# Patient Record
Sex: Female | Born: 1955 | Race: White | Hispanic: No | Marital: Single | Smoking: Current every day smoker
Health system: Southern US, Community
[De-identification: ages and names within clinical notes are randomized; demographics above are authoritative.]

## PROBLEM LIST (undated history)

## (undated) DIAGNOSIS — F419 Anxiety disorder, unspecified: Secondary | ICD-10-CM

## (undated) DIAGNOSIS — B1920 Unspecified viral hepatitis C without hepatic coma: Secondary | ICD-10-CM

## (undated) DIAGNOSIS — C189 Malignant neoplasm of colon, unspecified: Secondary | ICD-10-CM

## (undated) DIAGNOSIS — B182 Chronic viral hepatitis C: Secondary | ICD-10-CM

## (undated) DIAGNOSIS — A6000 Herpesviral infection of urogenital system, unspecified: Secondary | ICD-10-CM

## (undated) DIAGNOSIS — T7840XA Allergy, unspecified, initial encounter: Secondary | ICD-10-CM

## (undated) DIAGNOSIS — I10 Essential (primary) hypertension: Secondary | ICD-10-CM

## (undated) DIAGNOSIS — F1020 Alcohol dependence, uncomplicated: Secondary | ICD-10-CM

## (undated) DIAGNOSIS — C20 Malignant neoplasm of rectum: Secondary | ICD-10-CM

## (undated) DIAGNOSIS — Z86718 Personal history of other venous thrombosis and embolism: Secondary | ICD-10-CM

## (undated) DIAGNOSIS — G43909 Migraine, unspecified, not intractable, without status migrainosus: Secondary | ICD-10-CM

## (undated) DIAGNOSIS — B009 Herpesviral infection, unspecified: Secondary | ICD-10-CM

## (undated) HISTORY — DX: Herpesviral infection, unspecified: B00.9

## (undated) HISTORY — DX: Allergy, unspecified, initial encounter: T78.40XA

## (undated) HISTORY — DX: Herpesviral infection of urogenital system, unspecified: A60.00

## (undated) HISTORY — PX: COLONOSCOPY W/ BIOPSIES AND POLYPECTOMY: SHX1376

## (undated) HISTORY — DX: Unspecified viral hepatitis C without hepatic coma: B19.20

## (undated) HISTORY — DX: Migraine, unspecified, not intractable, without status migrainosus: G43.909

## (undated) HISTORY — PX: COLON SURGERY: SHX602

## (undated) HISTORY — DX: Personal history of other venous thrombosis and embolism: Z86.718

## (undated) HISTORY — DX: Chronic viral hepatitis C: B18.2

## (undated) HISTORY — PX: TONSILLECTOMY AND ADENOIDECTOMY: SUR1326

## (undated) HISTORY — DX: Malignant neoplasm of rectum: C20

## (undated) HISTORY — DX: Anxiety disorder, unspecified: F41.9

## (undated) HISTORY — PX: TONSILLECTOMY: SUR1361

## (undated) HISTORY — DX: Malignant neoplasm of colon, unspecified: C18.9

## (undated) HISTORY — DX: Alcohol dependence, uncomplicated: F10.20

## (undated) HISTORY — DX: Essential (primary) hypertension: I10

## (undated) HISTORY — PX: BLADDER REPAIR: SHX76

---

## 1994-01-14 HISTORY — PX: APPENDECTOMY: SHX54

## 1994-01-14 HISTORY — PX: ABDOMINAL HYSTERECTOMY: SHX81

## 2001-05-11 ENCOUNTER — Encounter: Payer: Self-pay | Admitting: Internal Medicine

## 2001-05-11 ENCOUNTER — Encounter: Admission: RE | Admit: 2001-05-11 | Discharge: 2001-05-11 | Payer: Self-pay | Admitting: Internal Medicine

## 2007-07-15 DIAGNOSIS — C189 Malignant neoplasm of colon, unspecified: Secondary | ICD-10-CM

## 2007-07-15 HISTORY — DX: Malignant neoplasm of colon, unspecified: C18.9

## 2007-08-31 ENCOUNTER — Encounter: Admission: RE | Admit: 2007-08-31 | Discharge: 2007-08-31 | Payer: Self-pay | Admitting: Surgery

## 2007-09-03 ENCOUNTER — Ambulatory Visit: Payer: Self-pay | Admitting: Hematology and Oncology

## 2007-09-07 ENCOUNTER — Ambulatory Visit: Admission: RE | Admit: 2007-09-07 | Discharge: 2007-11-08 | Payer: Self-pay | Admitting: Radiation Oncology

## 2007-09-07 ENCOUNTER — Encounter: Payer: Self-pay | Admitting: Gastroenterology

## 2007-09-09 ENCOUNTER — Telehealth (INDEPENDENT_AMBULATORY_CARE_PROVIDER_SITE_OTHER): Payer: Self-pay | Admitting: *Deleted

## 2007-09-09 ENCOUNTER — Encounter: Payer: Self-pay | Admitting: Gastroenterology

## 2007-09-09 DIAGNOSIS — C7A026 Malignant carcinoid tumor of the rectum: Secondary | ICD-10-CM

## 2007-09-10 LAB — CBC WITH DIFFERENTIAL/PLATELET
BASO%: 0.6 % (ref 0.0–2.0)
Eosinophils Absolute: 0.2 10*3/uL (ref 0.0–0.5)
HCT: 42.4 % (ref 34.8–46.6)
LYMPH%: 18.8 % (ref 14.0–48.0)
MONO#: 0.5 10*3/uL (ref 0.1–0.9)
NEUT#: 5.3 10*3/uL (ref 1.5–6.5)
NEUT%: 71.8 % (ref 39.6–76.8)
Platelets: 294 10*3/uL (ref 145–400)
RBC: 4.13 10*6/uL (ref 3.70–5.32)
WBC: 7.4 10*3/uL (ref 3.9–10.0)
lymph#: 1.4 10*3/uL (ref 0.9–3.3)

## 2007-09-10 LAB — COMPREHENSIVE METABOLIC PANEL
ALT: 38 U/L — ABNORMAL HIGH (ref 0–35)
AST: 27 U/L (ref 0–37)
Albumin: 4.5 g/dL (ref 3.5–5.2)
CO2: 21 mEq/L (ref 19–32)
Calcium: 9.8 mg/dL (ref 8.4–10.5)
Chloride: 100 mEq/L (ref 96–112)
Creatinine, Ser: 0.66 mg/dL (ref 0.40–1.20)
Potassium: 3.8 mEq/L (ref 3.5–5.3)

## 2007-09-10 LAB — CEA: CEA: 4.5 ng/mL (ref 0.0–5.0)

## 2007-09-14 ENCOUNTER — Ambulatory Visit (HOSPITAL_COMMUNITY): Admission: RE | Admit: 2007-09-14 | Discharge: 2007-09-14 | Payer: Self-pay | Admitting: Gastroenterology

## 2007-09-14 ENCOUNTER — Encounter: Payer: Self-pay | Admitting: Gastroenterology

## 2007-09-15 ENCOUNTER — Ambulatory Visit (HOSPITAL_COMMUNITY): Admission: RE | Admit: 2007-09-15 | Discharge: 2007-09-15 | Payer: Self-pay | Admitting: Hematology and Oncology

## 2007-09-16 ENCOUNTER — Ambulatory Visit: Payer: Self-pay | Admitting: Gastroenterology

## 2007-09-24 ENCOUNTER — Ambulatory Visit (HOSPITAL_COMMUNITY): Admission: RE | Admit: 2007-09-24 | Discharge: 2007-09-24 | Payer: Self-pay | Admitting: Hematology and Oncology

## 2007-09-30 LAB — BASIC METABOLIC PANEL
CO2: 24 mEq/L (ref 19–32)
Chloride: 102 mEq/L (ref 96–112)
Creatinine, Ser: 0.67 mg/dL (ref 0.40–1.20)
Potassium: 3.7 mEq/L (ref 3.5–5.3)

## 2007-09-30 LAB — CBC WITH DIFFERENTIAL/PLATELET
BASO%: 1.5 % (ref 0.0–2.0)
Basophils Absolute: 0.1 10*3/uL (ref 0.0–0.1)
HCT: 38.6 % (ref 34.8–46.6)
HGB: 13.6 g/dL (ref 11.6–15.9)
MCHC: 35.2 g/dL (ref 32.0–36.0)
MONO#: 0.4 10*3/uL (ref 0.1–0.9)
NEUT%: 64.1 % (ref 39.6–76.8)
WBC: 4.3 10*3/uL (ref 3.9–10.0)
lymph#: 1 10*3/uL (ref 0.9–3.3)

## 2007-10-07 LAB — CBC WITH DIFFERENTIAL/PLATELET
Basophils Absolute: 0 10*3/uL (ref 0.0–0.1)
EOS%: 5 % (ref 0.0–7.0)
HGB: 12.4 g/dL (ref 11.6–15.9)
LYMPH%: 17.6 % (ref 14.0–48.0)
MCH: 36.4 pg — ABNORMAL HIGH (ref 26.0–34.0)
MCV: 103.2 fL — ABNORMAL HIGH (ref 81.0–101.0)
MONO%: 13.9 % — ABNORMAL HIGH (ref 0.0–13.0)
NEUT%: 63.1 % (ref 39.6–76.8)
Platelets: 196 10*3/uL (ref 145–400)
RDW: 13.8 % (ref 11.3–14.5)

## 2007-10-14 LAB — CBC WITH DIFFERENTIAL/PLATELET
EOS%: 8.2 % — ABNORMAL HIGH (ref 0.0–7.0)
LYMPH%: 13.7 % — ABNORMAL LOW (ref 14.0–48.0)
MCH: 35.5 pg — ABNORMAL HIGH (ref 26.0–34.0)
MCHC: 35.9 g/dL (ref 32.0–36.0)
MCV: 99.1 fL (ref 81.0–101.0)
MONO%: 10.2 % (ref 0.0–13.0)
NEUT#: 3.2 10*3/uL (ref 1.5–6.5)
RBC: 3.64 10*6/uL — ABNORMAL LOW (ref 3.70–5.32)
RDW: 12.9 % (ref 11.3–14.5)

## 2007-10-14 LAB — BASIC METABOLIC PANEL
Calcium: 8.9 mg/dL (ref 8.4–10.5)
Potassium: 3.5 mEq/L (ref 3.5–5.3)
Sodium: 135 mEq/L (ref 135–145)

## 2007-10-19 ENCOUNTER — Ambulatory Visit: Payer: Self-pay | Admitting: Hematology and Oncology

## 2007-10-23 LAB — CBC WITH DIFFERENTIAL/PLATELET
BASO%: 1.1 % (ref 0.0–2.0)
HCT: 36.9 % (ref 34.8–46.6)
MCHC: 36.3 g/dL — ABNORMAL HIGH (ref 32.0–36.0)
MONO#: 0.6 10*3/uL (ref 0.1–0.9)
NEUT#: 2.5 10*3/uL (ref 1.5–6.5)
NEUT%: 67.2 % (ref 39.6–76.8)
WBC: 3.8 10*3/uL — ABNORMAL LOW (ref 3.9–10.0)
lymph#: 0.3 10*3/uL — ABNORMAL LOW (ref 0.9–3.3)

## 2007-10-30 LAB — COMPREHENSIVE METABOLIC PANEL
BUN: 13 mg/dL (ref 6–23)
CO2: 23 mEq/L (ref 19–32)
Calcium: 9.1 mg/dL (ref 8.4–10.5)
Chloride: 100 mEq/L (ref 96–112)
Creatinine, Ser: 0.55 mg/dL (ref 0.40–1.20)

## 2007-10-30 LAB — CBC WITH DIFFERENTIAL/PLATELET
Basophils Absolute: 0 10*3/uL (ref 0.0–0.1)
HCT: 36.5 % (ref 34.8–46.6)
HGB: 12.8 g/dL (ref 11.6–15.9)
MCH: 36.2 pg — ABNORMAL HIGH (ref 26.0–34.0)
MONO#: 0.5 10*3/uL (ref 0.1–0.9)
NEUT%: 72.7 % (ref 39.6–76.8)
WBC: 3.7 10*3/uL — ABNORMAL LOW (ref 3.9–10.0)
lymph#: 0.2 10*3/uL — ABNORMAL LOW (ref 0.9–3.3)

## 2007-11-20 ENCOUNTER — Ambulatory Visit: Admission: RE | Admit: 2007-11-20 | Discharge: 2007-11-20 | Payer: Self-pay | Admitting: Radiation Oncology

## 2007-11-20 LAB — URINALYSIS, MICROSCOPIC - CHCC
Blood: NEGATIVE
Ketones: NEGATIVE mg/dL
Protein: NEGATIVE mg/dL
Specific Gravity, Urine: 1.005 (ref 1.003–1.035)
pH: 5 (ref 4.6–8.0)

## 2007-11-22 LAB — URINE CULTURE

## 2007-12-03 ENCOUNTER — Ambulatory Visit (HOSPITAL_COMMUNITY): Admission: RE | Admit: 2007-12-03 | Discharge: 2007-12-03 | Payer: Self-pay | Admitting: Hematology and Oncology

## 2007-12-04 ENCOUNTER — Ambulatory Visit: Payer: Self-pay | Admitting: Hematology and Oncology

## 2007-12-15 DIAGNOSIS — Z86718 Personal history of other venous thrombosis and embolism: Secondary | ICD-10-CM

## 2007-12-15 HISTORY — DX: Personal history of other venous thrombosis and embolism: Z86.718

## 2007-12-18 ENCOUNTER — Encounter: Payer: Self-pay | Admitting: Hematology and Oncology

## 2007-12-18 ENCOUNTER — Ambulatory Visit: Admission: RE | Admit: 2007-12-18 | Discharge: 2007-12-18 | Payer: Self-pay | Admitting: Hematology and Oncology

## 2007-12-18 ENCOUNTER — Ambulatory Visit: Payer: Self-pay | Admitting: Surgery

## 2007-12-22 ENCOUNTER — Encounter (INDEPENDENT_AMBULATORY_CARE_PROVIDER_SITE_OTHER): Payer: Self-pay | Admitting: Surgery

## 2007-12-22 ENCOUNTER — Inpatient Hospital Stay (HOSPITAL_COMMUNITY): Admission: RE | Admit: 2007-12-22 | Discharge: 2007-12-25 | Payer: Self-pay | Admitting: Surgery

## 2007-12-31 LAB — CBC WITH DIFFERENTIAL/PLATELET
BASO%: 0.2 % (ref 0.0–2.0)
EOS%: 1.4 % (ref 0.0–7.0)
HCT: 36.7 % (ref 34.8–46.6)
MCH: 35.9 pg — ABNORMAL HIGH (ref 26.0–34.0)
MCHC: 33.9 g/dL (ref 32.0–36.0)
MONO#: 0.6 10*3/uL (ref 0.1–0.9)
NEUT%: 76.4 % (ref 39.6–76.8)
RBC: 3.47 10*6/uL — ABNORMAL LOW (ref 3.70–5.32)
RDW: 15 % — ABNORMAL HIGH (ref 11.3–14.5)
WBC: 5.7 10*3/uL (ref 3.9–10.0)
lymph#: 0.7 10*3/uL — ABNORMAL LOW (ref 0.9–3.3)

## 2007-12-31 LAB — PROTHROMBIN TIME: INR: 1 (ref 0.0–1.5)

## 2007-12-31 LAB — APTT: aPTT: 30 seconds (ref 24–37)

## 2008-01-04 LAB — PROTIME-INR: Protime: 27.6 Seconds — ABNORMAL HIGH (ref 10.6–13.4)

## 2008-01-11 LAB — PROTIME-INR: Protime: 34.8 Seconds — ABNORMAL HIGH (ref 10.6–13.4)

## 2008-01-26 ENCOUNTER — Ambulatory Visit: Payer: Self-pay | Admitting: Hematology and Oncology

## 2008-01-28 ENCOUNTER — Ambulatory Visit (HOSPITAL_COMMUNITY): Admission: RE | Admit: 2008-01-28 | Discharge: 2008-01-28 | Payer: Self-pay | Admitting: Internal Medicine

## 2008-01-28 LAB — CBC WITH DIFFERENTIAL/PLATELET
Basophils Absolute: 0 10*3/uL (ref 0.0–0.1)
Eosinophils Absolute: 0.1 10*3/uL (ref 0.0–0.5)
HGB: 13.7 g/dL (ref 11.6–15.9)
NEUT#: 5.1 10*3/uL (ref 1.5–6.5)
RDW: 15.1 % — ABNORMAL HIGH (ref 11.3–14.5)
lymph#: 0.6 10*3/uL — ABNORMAL LOW (ref 0.9–3.3)

## 2008-01-28 LAB — PROTIME-INR: INR: 1.5 — ABNORMAL LOW (ref 2.00–3.50)

## 2008-01-28 LAB — COMPREHENSIVE METABOLIC PANEL
ALT: 13 U/L (ref 0–35)
AST: 12 U/L (ref 0–37)
Calcium: 9.1 mg/dL (ref 8.4–10.5)
Chloride: 102 mEq/L (ref 96–112)
Creatinine, Ser: 0.55 mg/dL (ref 0.40–1.20)
Sodium: 138 mEq/L (ref 135–145)
Total Bilirubin: 0.5 mg/dL (ref 0.3–1.2)

## 2008-02-10 LAB — CBC WITH DIFFERENTIAL/PLATELET
Basophils Absolute: 0 10*3/uL (ref 0.0–0.1)
EOS%: 1.1 % (ref 0.0–7.0)
HCT: 39.6 % (ref 34.8–46.6)
HGB: 13.5 g/dL (ref 11.6–15.9)
LYMPH%: 10.7 % — ABNORMAL LOW (ref 14.0–48.0)
MCH: 34.6 pg — ABNORMAL HIGH (ref 26.0–34.0)
MCV: 101.1 fL — ABNORMAL HIGH (ref 81.0–101.0)
MONO%: 9.4 % (ref 0.0–13.0)
NEUT%: 78.7 % — ABNORMAL HIGH (ref 39.6–76.8)

## 2008-02-12 LAB — PROTIME-INR
INR: 2.9 (ref 2.00–3.50)
Protime: 34.8 Seconds — ABNORMAL HIGH (ref 10.6–13.4)

## 2008-02-19 LAB — PROTIME-INR: Protime: 16.8 Seconds — ABNORMAL HIGH (ref 10.6–13.4)

## 2008-03-10 LAB — CBC WITH DIFFERENTIAL/PLATELET
Basophils Absolute: 0 10*3/uL (ref 0.0–0.1)
Eosinophils Absolute: 0 10*3/uL (ref 0.0–0.5)
HCT: 40.6 % (ref 34.8–46.6)
HGB: 13.9 g/dL (ref 11.6–15.9)
MCV: 101.2 fL — ABNORMAL HIGH (ref 79.5–101.0)
MONO%: 13.1 % (ref 0.0–14.0)
NEUT#: 3.1 10*3/uL (ref 1.5–6.5)
RDW: 16.2 % — ABNORMAL HIGH (ref 11.2–14.5)

## 2008-03-10 LAB — COMPREHENSIVE METABOLIC PANEL
Albumin: 4.9 g/dL (ref 3.5–5.2)
Alkaline Phosphatase: 73 U/L (ref 39–117)
BUN: 9 mg/dL (ref 6–23)
Calcium: 9.4 mg/dL (ref 8.4–10.5)
Chloride: 103 mEq/L (ref 96–112)
Glucose, Bld: 78 mg/dL (ref 70–99)
Potassium: 4.6 mEq/L (ref 3.5–5.3)

## 2008-03-10 LAB — PROTIME-INR: INR: 1.4 — ABNORMAL LOW (ref 2.00–3.50)

## 2008-03-14 ENCOUNTER — Ambulatory Visit: Payer: Self-pay | Admitting: Hematology and Oncology

## 2008-03-16 ENCOUNTER — Ambulatory Visit (HOSPITAL_COMMUNITY): Admission: RE | Admit: 2008-03-16 | Discharge: 2008-03-16 | Payer: Self-pay | Admitting: Oncology

## 2008-03-16 LAB — COMPREHENSIVE METABOLIC PANEL
AST: 12 U/L (ref 0–37)
Albumin: 4.4 g/dL (ref 3.5–5.2)
Alkaline Phosphatase: 74 U/L (ref 39–117)
Glucose, Bld: 111 mg/dL — ABNORMAL HIGH (ref 70–99)
Potassium: 3.6 mEq/L (ref 3.5–5.3)
Sodium: 139 mEq/L (ref 135–145)
Total Protein: 7.3 g/dL (ref 6.0–8.3)

## 2008-03-16 LAB — CBC WITH DIFFERENTIAL/PLATELET
Basophils Absolute: 0 10*3/uL (ref 0.0–0.1)
Eosinophils Absolute: 0 10*3/uL (ref 0.0–0.5)
HCT: 36.9 % (ref 34.8–46.6)
HGB: 12.6 g/dL (ref 11.6–15.9)
MCV: 96.9 fL (ref 79.5–101.0)
MONO%: 12 % (ref 0.0–14.0)
NEUT#: 3.8 10*3/uL (ref 1.5–6.5)
RDW: 15.2 % — ABNORMAL HIGH (ref 11.2–14.5)

## 2008-03-16 LAB — PROTIME-INR
INR: 1.5 — ABNORMAL LOW (ref 2.00–3.50)
Protime: 18 Seconds — ABNORMAL HIGH (ref 10.6–13.4)

## 2008-03-30 LAB — CBC WITH DIFFERENTIAL/PLATELET
Basophils Absolute: 0 10*3/uL (ref 0.0–0.1)
EOS%: 0.3 % (ref 0.0–7.0)
HCT: 40.1 % (ref 34.8–46.6)
HGB: 14.3 g/dL (ref 11.6–15.9)
MCH: 34 pg (ref 25.1–34.0)
MCV: 95.5 fL (ref 79.5–101.0)
MONO%: 10.3 % (ref 0.0–14.0)
NEUT%: 79.8 % — ABNORMAL HIGH (ref 38.4–76.8)
Platelets: 242 10*3/uL (ref 145–400)

## 2008-03-30 LAB — COMPREHENSIVE METABOLIC PANEL
Albumin: 4.6 g/dL (ref 3.5–5.2)
BUN: 19 mg/dL (ref 6–23)
Calcium: 9.3 mg/dL (ref 8.4–10.5)
Chloride: 96 mEq/L (ref 96–112)
Glucose, Bld: 88 mg/dL (ref 70–99)
Potassium: 3.1 mEq/L — ABNORMAL LOW (ref 3.5–5.3)

## 2008-04-06 LAB — PROTIME-INR: INR: 2.7 (ref 2.00–3.50)

## 2008-04-12 LAB — CBC WITH DIFFERENTIAL/PLATELET
BASO%: 0.2 % (ref 0.0–2.0)
EOS%: 2.3 % (ref 0.0–7.0)
HCT: 37.6 % (ref 34.8–46.6)
MCH: 35 pg — ABNORMAL HIGH (ref 25.1–34.0)
MCHC: 34.4 g/dL (ref 31.5–36.0)
MCV: 101.7 fL — ABNORMAL HIGH (ref 79.5–101.0)
MONO%: 12.1 % (ref 0.0–14.0)
NEUT%: 75.9 % (ref 38.4–76.8)
RDW: 17.5 % — ABNORMAL HIGH (ref 11.2–14.5)
lymph#: 0.4 10*3/uL — ABNORMAL LOW (ref 0.9–3.3)

## 2008-04-12 LAB — BASIC METABOLIC PANEL
BUN: 14 mg/dL (ref 6–23)
CO2: 25 mEq/L (ref 19–32)
Calcium: 9.4 mg/dL (ref 8.4–10.5)
Creatinine, Ser: 0.65 mg/dL (ref 0.40–1.20)

## 2008-04-12 LAB — PROTIME-INR

## 2008-04-26 LAB — CBC WITH DIFFERENTIAL/PLATELET
Basophils Absolute: 0 10*3/uL (ref 0.0–0.1)
EOS%: 2 % (ref 0.0–7.0)
HCT: 37 % (ref 34.8–46.6)
HGB: 12.7 g/dL (ref 11.6–15.9)
LYMPH%: 9.2 % — ABNORMAL LOW (ref 14.0–49.7)
MCH: 35.8 pg — ABNORMAL HIGH (ref 25.1–34.0)
MCV: 103.8 fL — ABNORMAL HIGH (ref 79.5–101.0)
MONO%: 12.8 % (ref 0.0–14.0)
NEUT%: 75.8 % (ref 38.4–76.8)
Platelets: 248 10*3/uL (ref 145–400)
RDW: 19.4 % — ABNORMAL HIGH (ref 11.2–14.5)

## 2008-04-26 LAB — COMPREHENSIVE METABOLIC PANEL
AST: 37 U/L (ref 0–37)
Albumin: 3.8 g/dL (ref 3.5–5.2)
Alkaline Phosphatase: 67 U/L (ref 39–117)
BUN: 12 mg/dL (ref 6–23)
Potassium: 4.6 mEq/L (ref 3.5–5.3)
Total Bilirubin: 1.3 mg/dL — ABNORMAL HIGH (ref 0.3–1.2)

## 2008-04-26 LAB — PROTHROMBIN TIME: INR: 5.2 (ref 0.0–1.5)

## 2008-04-29 ENCOUNTER — Ambulatory Visit: Payer: Self-pay | Admitting: Hematology and Oncology

## 2008-04-29 LAB — PROTIME-INR
INR: 2 (ref 2.00–3.50)
Protime: 24 Seconds — ABNORMAL HIGH (ref 10.6–13.4)

## 2008-05-04 LAB — PROTIME-INR
INR: 1.4 — ABNORMAL LOW (ref 2.00–3.50)
Protime: 16.8 Seconds — ABNORMAL HIGH (ref 10.6–13.4)

## 2008-05-11 LAB — PROTIME-INR: INR: 1.5 — ABNORMAL LOW (ref 2.00–3.50)

## 2008-05-20 ENCOUNTER — Ambulatory Visit (HOSPITAL_COMMUNITY): Admission: RE | Admit: 2008-05-20 | Discharge: 2008-05-20 | Payer: Self-pay | Admitting: Hematology and Oncology

## 2008-05-20 LAB — CBC WITH DIFFERENTIAL/PLATELET
BASO%: 0.7 % (ref 0.0–2.0)
EOS%: 1.4 % (ref 0.0–7.0)
HCT: 38.9 % (ref 34.8–46.6)
MCH: 36.3 pg — ABNORMAL HIGH (ref 25.1–34.0)
MCHC: 34.1 g/dL (ref 31.5–36.0)
MONO#: 0.4 10*3/uL (ref 0.1–0.9)
NEUT%: 84.3 % — ABNORMAL HIGH (ref 38.4–76.8)
RBC: 3.65 10*6/uL — ABNORMAL LOW (ref 3.70–5.45)
WBC: 7.1 10*3/uL (ref 3.9–10.3)
lymph#: 0.5 10*3/uL — ABNORMAL LOW (ref 0.9–3.3)

## 2008-05-20 LAB — COMPREHENSIVE METABOLIC PANEL
Alkaline Phosphatase: 70 U/L (ref 39–117)
CO2: 28 mEq/L (ref 19–32)
Creatinine, Ser: 0.62 mg/dL (ref 0.40–1.20)
Glucose, Bld: 80 mg/dL (ref 70–99)
Total Bilirubin: 1 mg/dL (ref 0.3–1.2)

## 2008-05-20 LAB — CEA: CEA: 6.3 ng/mL — ABNORMAL HIGH (ref 0.0–5.0)

## 2008-05-20 LAB — PROTIME-INR: Protime: 18 Seconds — ABNORMAL HIGH (ref 10.6–13.4)

## 2008-05-24 ENCOUNTER — Encounter: Payer: Self-pay | Admitting: Hematology and Oncology

## 2008-05-24 ENCOUNTER — Ambulatory Visit: Admission: RE | Admit: 2008-05-24 | Discharge: 2008-05-24 | Payer: Self-pay | Admitting: Hematology and Oncology

## 2008-05-24 ENCOUNTER — Ambulatory Visit: Payer: Self-pay | Admitting: Vascular Surgery

## 2008-05-26 LAB — BASIC METABOLIC PANEL
BUN: 21 mg/dL (ref 6–23)
Potassium: 3.5 mEq/L (ref 3.5–5.3)

## 2008-05-26 LAB — PROTIME-INR
INR: 1.7 — ABNORMAL LOW (ref 2.00–3.50)
Protime: 20.4 Seconds — ABNORMAL HIGH (ref 10.6–13.4)

## 2008-06-02 LAB — PROTIME-INR
INR: 1.3 — ABNORMAL LOW (ref 2.00–3.50)
Protime: 15.6 Seconds — ABNORMAL HIGH (ref 10.6–13.4)

## 2008-06-07 ENCOUNTER — Ambulatory Visit: Payer: Self-pay | Admitting: Hematology and Oncology

## 2008-06-09 LAB — PROTIME-INR: INR: 2.1 (ref 2.00–3.50)

## 2008-06-23 LAB — PROTIME-INR

## 2008-07-01 LAB — PROTIME-INR
INR: 2.2 (ref 2.00–3.50)
Protime: 26.4 Seconds — ABNORMAL HIGH (ref 10.6–13.4)

## 2008-07-08 LAB — PROTIME-INR
INR: 1.4 — ABNORMAL LOW (ref 2.00–3.50)
Protime: 16.8 Seconds — ABNORMAL HIGH (ref 10.6–13.4)

## 2008-07-12 ENCOUNTER — Ambulatory Visit: Payer: Self-pay | Admitting: Hematology and Oncology

## 2008-07-14 LAB — PROTIME-INR
INR: 1.7 — ABNORMAL LOW (ref 2.00–3.50)
Protime: 20.4 Seconds — ABNORMAL HIGH (ref 10.6–13.4)

## 2008-08-04 LAB — PROTIME-INR
INR: 2.6 (ref 2.00–3.50)
Protime: 31.2 Seconds — ABNORMAL HIGH (ref 10.6–13.4)

## 2008-08-10 LAB — HM COLONOSCOPY

## 2008-08-11 ENCOUNTER — Ambulatory Visit: Payer: Self-pay | Admitting: Hematology and Oncology

## 2008-08-11 LAB — PROTIME-INR
INR: 1.1 — ABNORMAL LOW (ref 2.00–3.50)
Protime: 13.2 Seconds (ref 10.6–13.4)

## 2008-08-18 LAB — PROTIME-INR
INR: 2 (ref 2.00–3.50)
Protime: 24 Seconds — ABNORMAL HIGH (ref 10.6–13.4)

## 2008-09-01 ENCOUNTER — Ambulatory Visit: Admission: RE | Admit: 2008-09-01 | Discharge: 2008-09-01 | Payer: Self-pay | Admitting: Hematology and Oncology

## 2008-09-01 ENCOUNTER — Ambulatory Visit: Payer: Self-pay | Admitting: Vascular Surgery

## 2008-09-01 ENCOUNTER — Encounter: Payer: Self-pay | Admitting: Hematology and Oncology

## 2008-09-01 LAB — PROTIME-INR: Protime: 31.2 Seconds — ABNORMAL HIGH (ref 10.6–13.4)

## 2008-09-06 LAB — CBC WITH DIFFERENTIAL/PLATELET
Eosinophils Absolute: 0.2 10*3/uL (ref 0.0–0.5)
HGB: 13.4 g/dL (ref 11.6–15.9)
LYMPH%: 14.4 % (ref 14.0–49.7)
MONO#: 0.5 10*3/uL (ref 0.1–0.9)
NEUT#: 3.9 10*3/uL (ref 1.5–6.5)
Platelets: 215 10*3/uL (ref 145–400)
RBC: 3.82 10*6/uL (ref 3.70–5.45)
WBC: 5.4 10*3/uL (ref 3.9–10.3)
nRBC: 0 % (ref 0–0)

## 2008-09-06 LAB — COMPREHENSIVE METABOLIC PANEL
Albumin: 4.1 g/dL (ref 3.5–5.2)
Alkaline Phosphatase: 53 U/L (ref 39–117)
BUN: 19 mg/dL (ref 6–23)
CO2: 28 mEq/L (ref 19–32)
Calcium: 9 mg/dL (ref 8.4–10.5)
Chloride: 100 mEq/L (ref 96–112)
Glucose, Bld: 90 mg/dL (ref 70–99)
Potassium: 3.8 mEq/L (ref 3.5–5.3)
Total Protein: 7.6 g/dL (ref 6.0–8.3)

## 2008-09-06 LAB — PROTIME-INR: Protime: 25.2 Seconds — ABNORMAL HIGH (ref 10.6–13.4)

## 2008-09-06 LAB — CEA: CEA: 4.6 ng/mL (ref 0.0–5.0)

## 2008-09-13 ENCOUNTER — Ambulatory Visit: Payer: Self-pay | Admitting: Hematology and Oncology

## 2008-09-15 LAB — PROTIME-INR
INR: 2.2 (ref 2.00–3.50)
Protime: 26.4 Seconds — ABNORMAL HIGH (ref 10.6–13.4)

## 2008-09-29 LAB — PROTIME-INR: INR: 2.6 (ref 2.00–3.50)

## 2008-10-13 ENCOUNTER — Ambulatory Visit: Payer: Self-pay | Admitting: Oncology

## 2008-10-13 LAB — PROTIME-INR: INR: 3.3 (ref 2.00–3.50)

## 2008-10-21 LAB — PROTIME-INR
INR: 2.2 (ref 2.00–3.50)
Protime: 26.4 Seconds — ABNORMAL HIGH (ref 10.6–13.4)

## 2008-11-10 LAB — PROTIME-INR

## 2008-11-22 ENCOUNTER — Ambulatory Visit: Payer: Self-pay | Admitting: Oncology

## 2008-11-24 LAB — PROTIME-INR
INR: 2.7 (ref 2.00–3.50)
Protime: 32.4 Seconds — ABNORMAL HIGH (ref 10.6–13.4)

## 2008-12-07 LAB — PROTIME-INR
INR: 2 (ref 2.00–3.50)
Protime: 24 Seconds — ABNORMAL HIGH (ref 10.6–13.4)

## 2008-12-20 ENCOUNTER — Ambulatory Visit: Payer: Self-pay | Admitting: Oncology

## 2008-12-22 LAB — PROTIME-INR: Protime: 30 Seconds — ABNORMAL HIGH (ref 10.6–13.4)

## 2009-01-09 LAB — PROTIME-INR
INR: 1.4 — ABNORMAL LOW (ref 2.00–3.50)
Protime: 16.8 Seconds — ABNORMAL HIGH (ref 10.6–13.4)

## 2009-01-16 ENCOUNTER — Ambulatory Visit: Payer: Self-pay | Admitting: Oncology

## 2009-01-19 LAB — PROTIME-INR: INR: 2.2 (ref 2.00–3.50)

## 2009-02-02 ENCOUNTER — Ambulatory Visit (HOSPITAL_COMMUNITY): Admission: RE | Admit: 2009-02-02 | Discharge: 2009-02-02 | Payer: Self-pay | Admitting: Hematology and Oncology

## 2009-02-02 LAB — COMPREHENSIVE METABOLIC PANEL
AST: 21 U/L (ref 0–37)
Alkaline Phosphatase: 59 U/L (ref 39–117)
BUN: 17 mg/dL (ref 6–23)
Calcium: 9.3 mg/dL (ref 8.4–10.5)
Creatinine, Ser: 0.65 mg/dL (ref 0.40–1.20)
Glucose, Bld: 104 mg/dL — ABNORMAL HIGH (ref 70–99)
Potassium: 4 mEq/L (ref 3.5–5.3)
Sodium: 136 mEq/L (ref 135–145)

## 2009-02-02 LAB — CBC WITH DIFFERENTIAL/PLATELET
BASO%: 0.2 % (ref 0.0–2.0)
EOS%: 1.1 % (ref 0.0–7.0)
HCT: 41.2 % (ref 34.8–46.6)
HGB: 14.1 g/dL (ref 11.6–15.9)
LYMPH%: 9.5 % — ABNORMAL LOW (ref 14.0–49.7)
MONO%: 5.3 % (ref 0.0–14.0)
NEUT#: 4.8 10*3/uL (ref 1.5–6.5)
NEUT%: 83.9 % — ABNORMAL HIGH (ref 38.4–76.8)
Platelets: 315 10*3/uL (ref 145–400)
WBC: 5.7 10*3/uL (ref 3.9–10.3)

## 2009-02-14 ENCOUNTER — Ambulatory Visit (HOSPITAL_COMMUNITY): Admission: RE | Admit: 2009-02-14 | Discharge: 2009-02-14 | Payer: Self-pay | Admitting: Hematology and Oncology

## 2009-06-18 IMAGING — CR DG CHEST 2V
2 series · 2 of 2 positions shown · non-contrast
Comparison: CT chest 12/03/2007.

CLINICAL DATA: Rectal cancer.  Preoperative respiratory evaluation.
Long-time smoker.

CHEST - 2 VIEW 12/22/2007:

[view not recorded (1 of 2)]
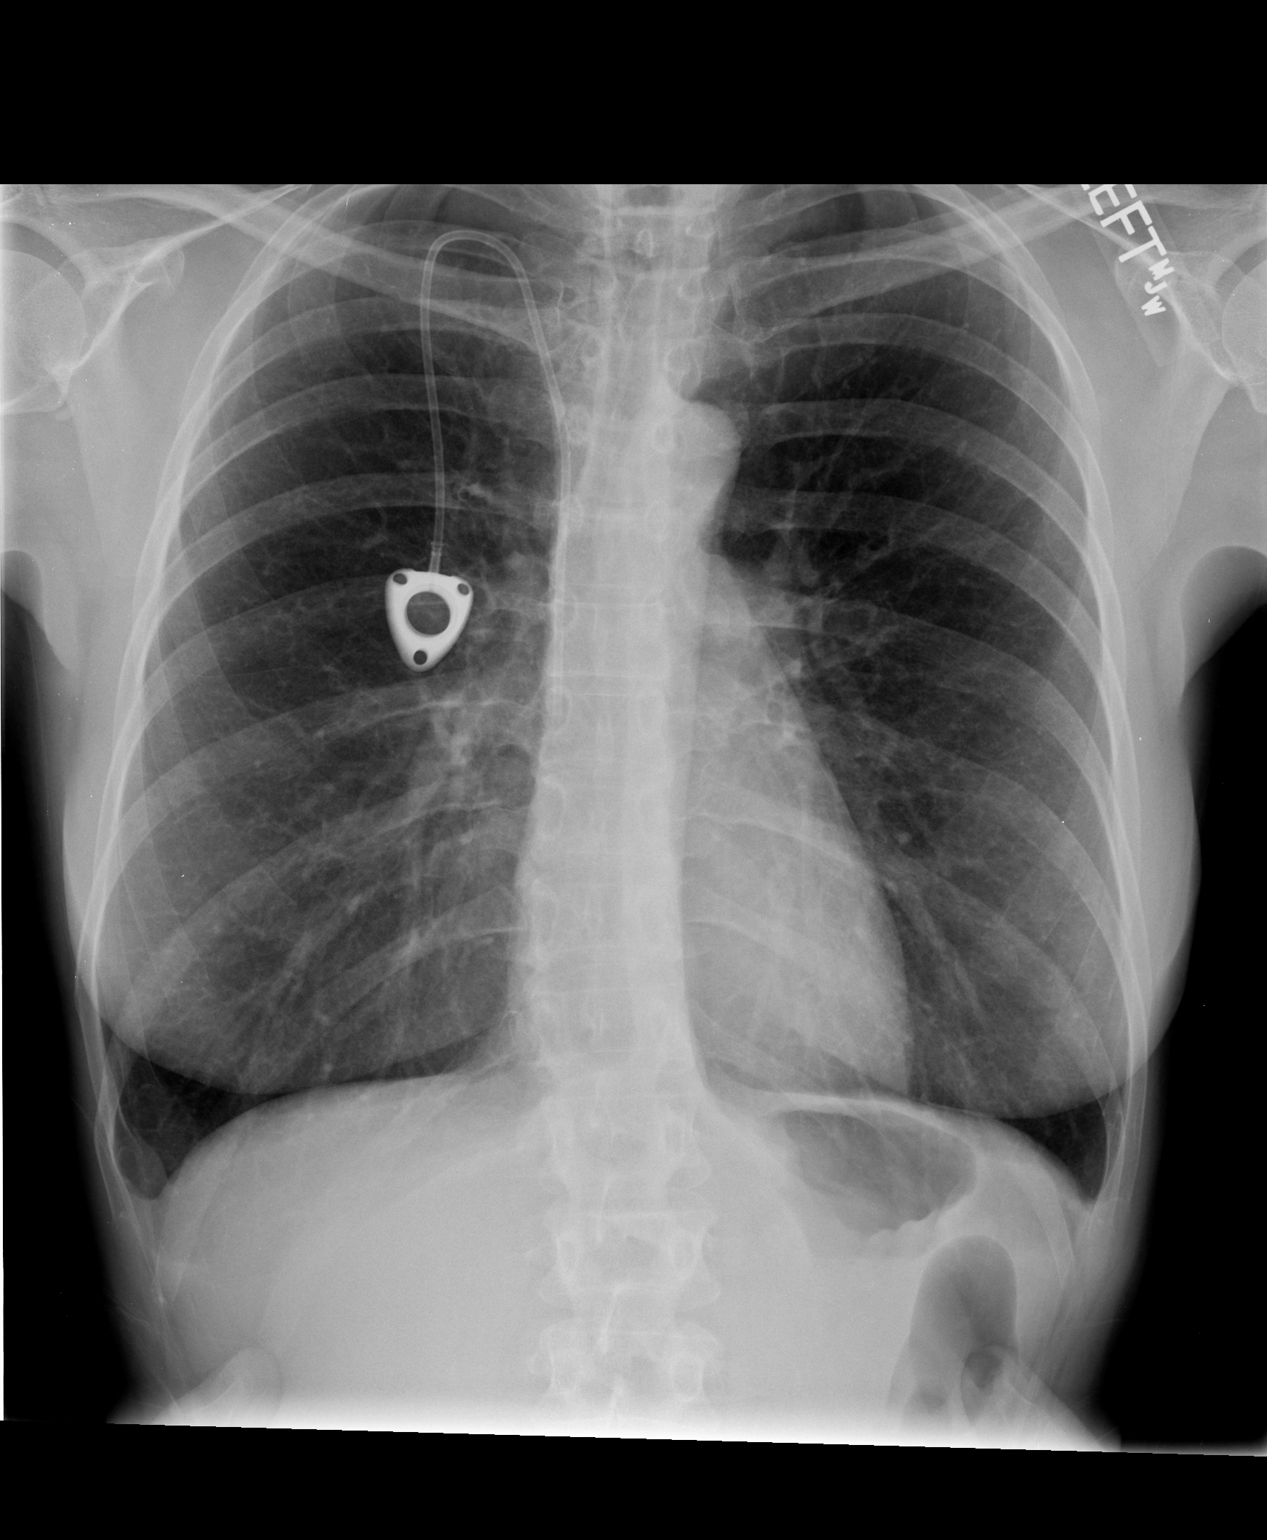

[view not recorded (2 of 2)]
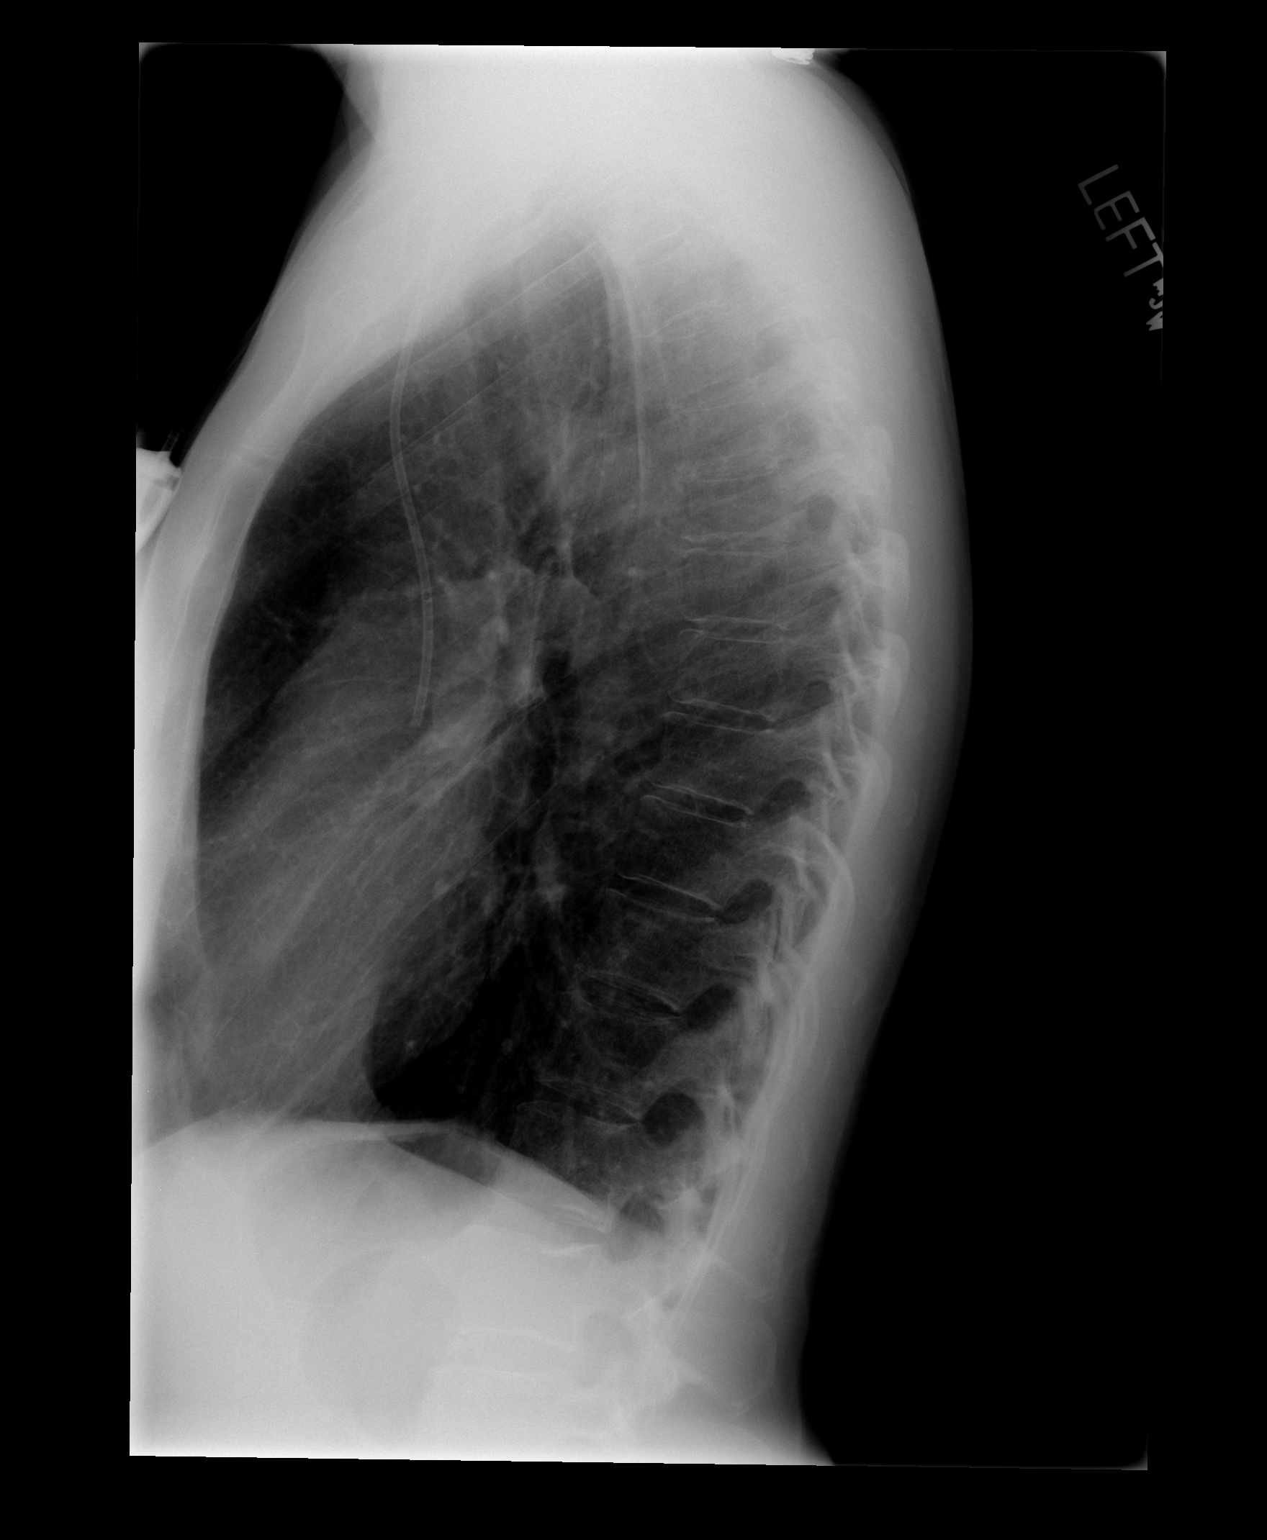

[2 of 2 positions shown; findings below may reference images not displayed]

FINDINGS: Cardiomediastinal silhouette unremarkable.  Lungs
hyperinflated with mildly prominent bronchovascular markings
diffusely and central peribronchial thickening.  Lungs otherwise
clear.  No pleural effusions.  Minimal degenerative changes in the
mid thoracic spine and very slight thoracic scoliosis convex right.
Right jugular Port-A-Cath tip in the SVC.
IMPRESSION: COPD/emphysema.  No acute cardiopulmonary disease.

## 2009-07-06 ENCOUNTER — Ambulatory Visit: Payer: Self-pay | Admitting: Oncology

## 2009-07-20 ENCOUNTER — Ambulatory Visit: Admission: RE | Admit: 2009-07-20 | Discharge: 2009-07-20 | Payer: Self-pay | Admitting: Hematology and Oncology

## 2009-07-20 ENCOUNTER — Encounter: Payer: Self-pay | Admitting: Hematology and Oncology

## 2009-07-20 ENCOUNTER — Ambulatory Visit: Payer: Self-pay | Admitting: Surgery

## 2009-07-20 LAB — COMPREHENSIVE METABOLIC PANEL
AST: 22 U/L (ref 0–37)
Alkaline Phosphatase: 56 U/L (ref 39–117)
CO2: 20 mEq/L (ref 19–32)
Calcium: 9.1 mg/dL (ref 8.4–10.5)
Glucose, Bld: 89 mg/dL (ref 70–99)

## 2009-07-20 LAB — PROTIME-INR

## 2009-07-20 LAB — CBC WITH DIFFERENTIAL/PLATELET
EOS%: 4.3 % (ref 0.0–7.0)
Eosinophils Absolute: 0.2 10*3/uL (ref 0.0–0.5)
HCT: 39.1 % (ref 34.8–46.6)
MCHC: 34.9 g/dL (ref 31.5–36.0)
MONO%: 8.6 % (ref 0.0–14.0)
Platelets: 295 10*3/uL (ref 145–400)
RBC: 3.76 10*6/uL (ref 3.70–5.45)
WBC: 4.7 10*3/uL (ref 3.9–10.3)

## 2009-07-20 LAB — CEA: CEA: 4.5 ng/mL (ref 0.0–5.0)

## 2009-11-15 IMAGING — CT CT ABDOMEN W/ CM
2 of 6 series · 16 of 46 positions shown, 18 images · IV contrast (agent unspecified)
Comparison: Prior examinations 12/03/2007.

CT CHEST

CLINICAL DATA: Rectal carcinoma post chemotherapy.  Restaging.

CT CHEST, ABDOMEN AND PELVIS WITH CONTRAST
TECHNIQUE: Multidetector CT imaging of the chest, abdomen and
pelvis was performed following the standard protocol during bolus
administration of intravenous contrast.
Contrast: 100 ml Mmnipaque-8XX intravenously.

[Series 2: cap with st · axial · 0.65mm/px · z∈[-587,-47]mm · 13 of 124 slices shown, 15 images]
[im 8/124  soft-tissue]
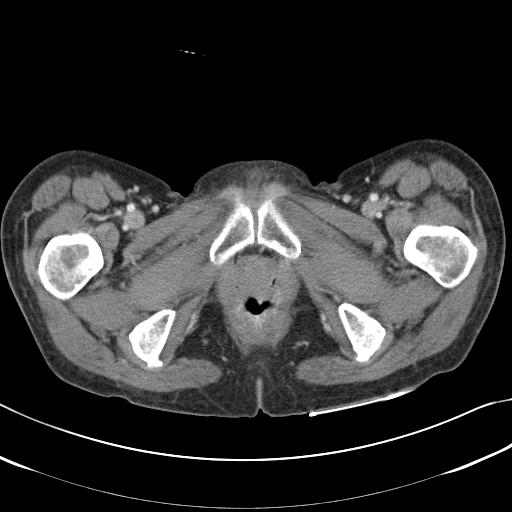
[im 8/124  bone]
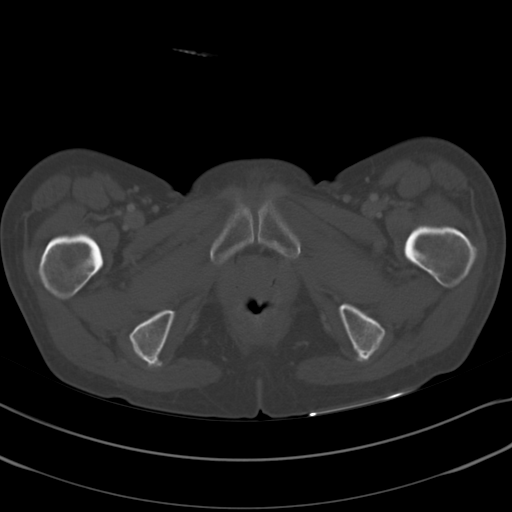
[im 16/124  soft-tissue]
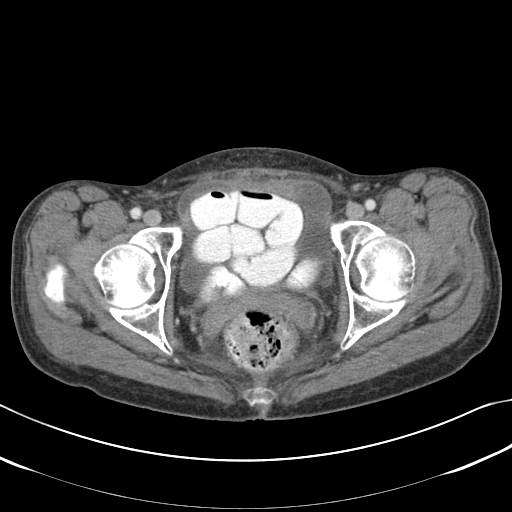
[im 24/124  soft-tissue]
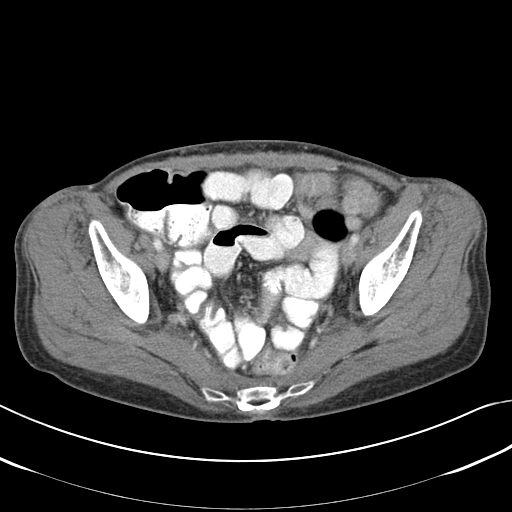
[im 39/124  soft-tissue]
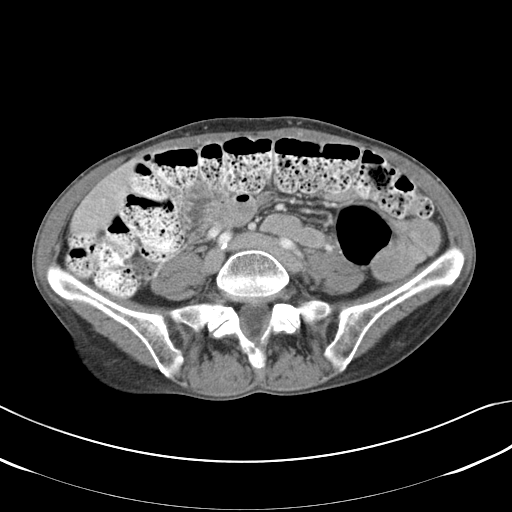
[im 47/124  soft-tissue]
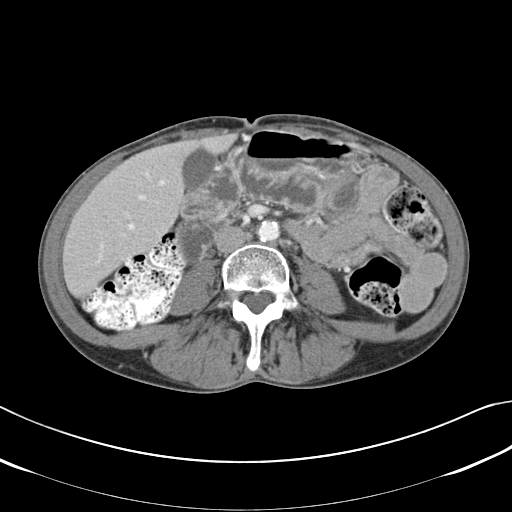
[im 54/124  soft-tissue]
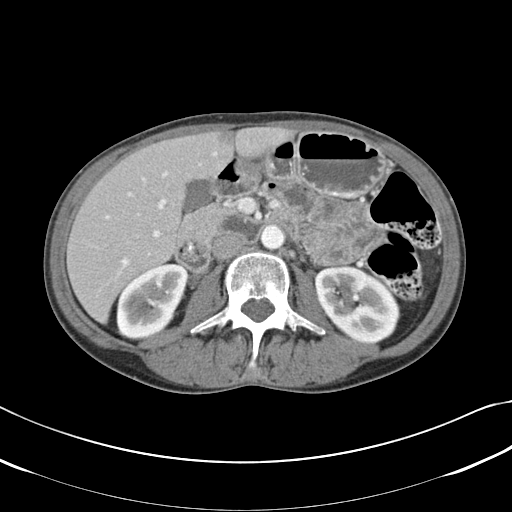
[im 62/124  soft-tissue]
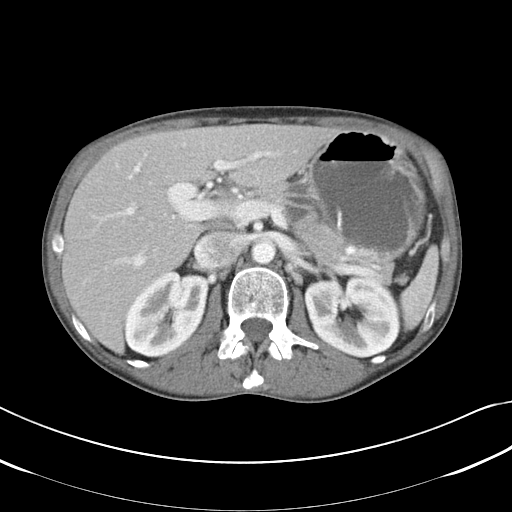
[im 70/124  soft-tissue]
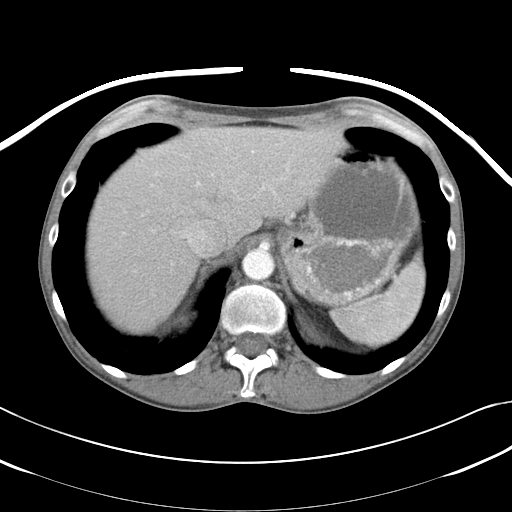
[im 77/124  soft-tissue]
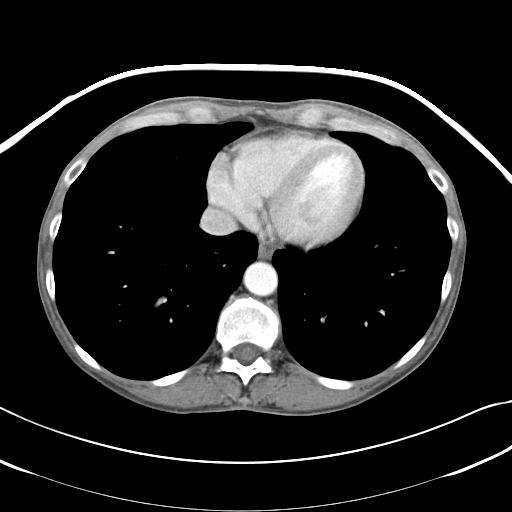
[im 77/124  bone]
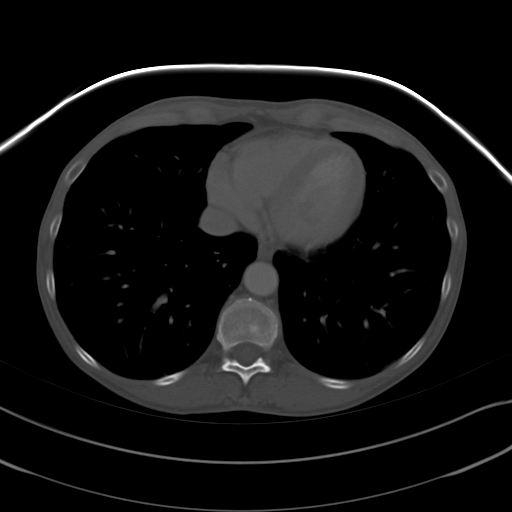
[im 85/124  soft-tissue]
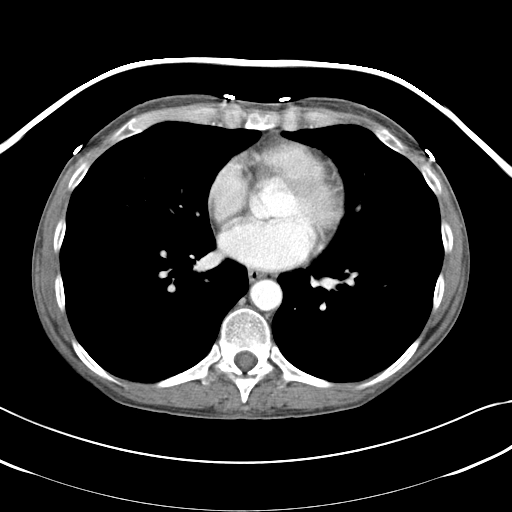
[im 100/124  soft-tissue]
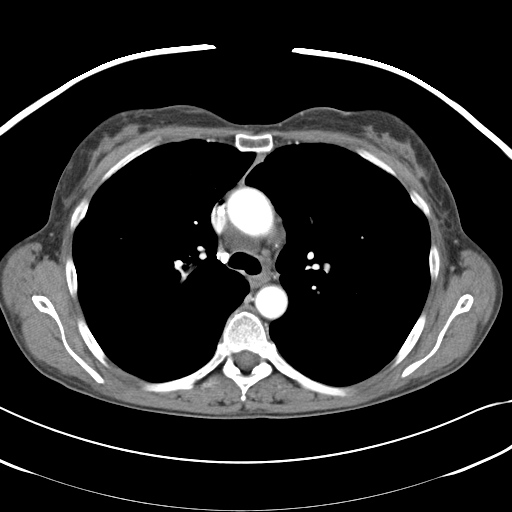
[im 108/124  soft-tissue]
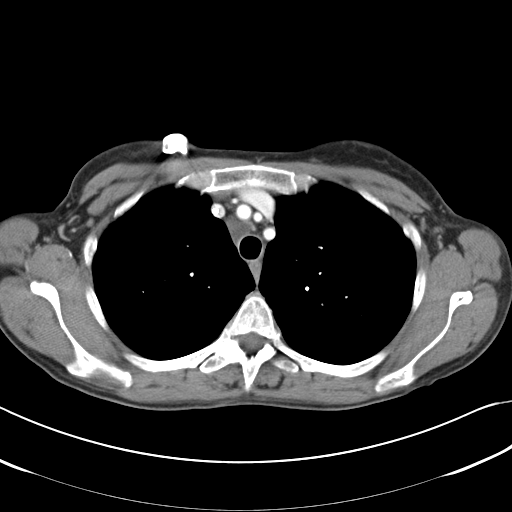
[im 116/124  soft-tissue]
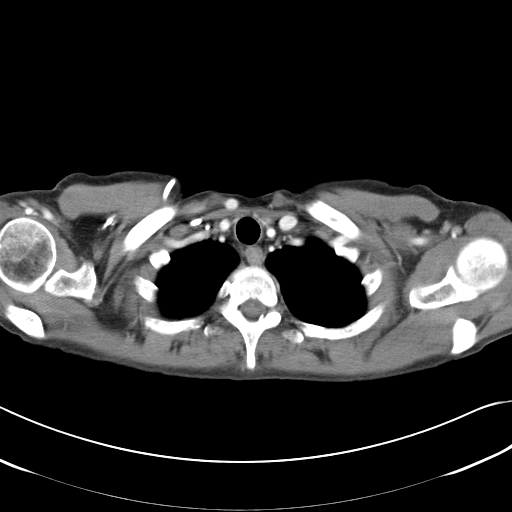

[Series 602: <mpr thick range> · coronal · 1.21mm/px · 3 of 67 slices shown]
[im 23/67  soft-tissue]
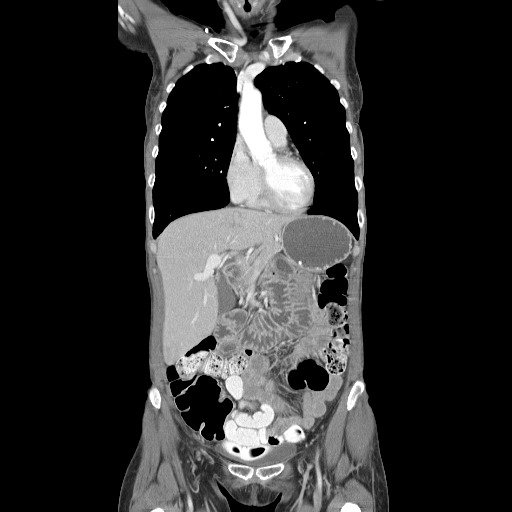
[im 30/67  soft-tissue]
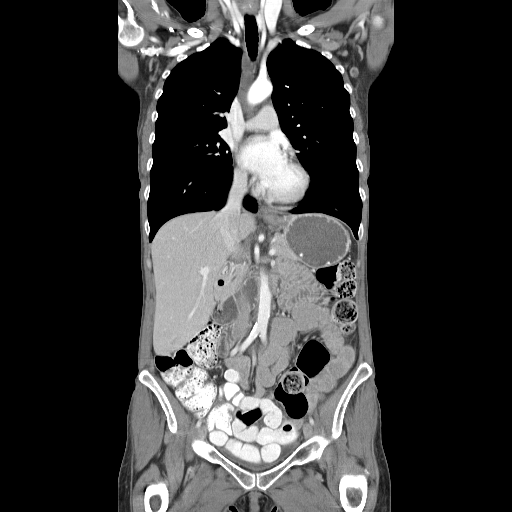
[im 37/67  soft-tissue]
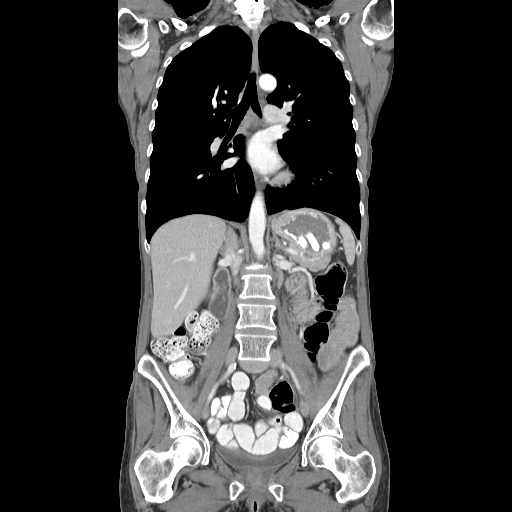

[16 of 46 positions shown; findings below may reference images not displayed]

FINDINGS: Right internal jugular Port-A-Cath tip is unchanged in
the SVC.  The prior examination demonstrated a nonocclusive
thrombus in the right internal jugular vein.  That vessel now
appears occluded and quite small in caliber.  The left internal
jugular vein is distended.  There is no thrombus within the
brachiocephalic veins or SVC.

The fluid density mass in the pretracheal and precarinal regions is
unchanged, measuring 1.6 x 2.2 cm transverse on image 21 and 2.4 x
1.4 cm transverse on image 26.  This remains most compatible with
an incidental bronchogenic cyst.  There are no enlarged mediastinal
or hilar lymph nodes.

There is no pleural or pericardial effusion.  The lungs have a
stable appearance without suspicious findings.  Focal scarring at
the right apex on image 8 and a tiny subpleural nodule at the right
lung base on image 53 are unchanged.
IMPRESSION: 1.  Right internal jugular vein occlusion.  No acute thrombosis
identified.
2.  Stable bronchogenic cyst in the superior mediastinum.
3.  No evidence of metastatic disease.

CT ABDOMEN
FINDINGS: The liver, spleen, gallbladder, pancreas and adrenal
glands appear stable.  No suspicious liver lesions are identified.
Multiple renal cysts appear stable.  There is no adenopathy or
ascites.  Normal sized retroperitoneal lymph nodes are unchanged.
No bowel lesions or suspicious osseous lesions are identified.
Lower lumbar spine facet degenerative changes are noted.
IMPRESSION: Stable examination.  No evidence of metastatic disease.

CT PELVIS
FINDINGS: Mild stranding in the perirectal fat is likely secondary
to prior therapy.  There is no focal fluid collection or mass.  The
rectosigmoid anastomosis has a stable appearance.  There is no
adenopathy or suspicious osseous lesion.
IMPRESSION: 1.  Slightly progressive soft tissue stranding in the perirectal
fat, probably post therapy related.
2.  No pelvic mass or adenopathy.

## 2010-01-26 ENCOUNTER — Ambulatory Visit: Payer: Self-pay | Admitting: Hematology and Oncology

## 2010-01-30 ENCOUNTER — Ambulatory Visit (HOSPITAL_COMMUNITY)
Admission: RE | Admit: 2010-01-30 | Discharge: 2010-01-30 | Payer: Self-pay | Source: Home / Self Care | Attending: Hematology and Oncology | Admitting: Hematology and Oncology

## 2010-01-30 LAB — COMPREHENSIVE METABOLIC PANEL
ALT: 19 U/L (ref 0–35)
AST: 25 U/L (ref 0–37)
Albumin: 4.6 g/dL (ref 3.5–5.2)
Alkaline Phosphatase: 63 U/L (ref 39–117)
BUN: 10 mg/dL (ref 6–23)
CO2: 27 mEq/L (ref 19–32)
Calcium: 9.9 mg/dL (ref 8.4–10.5)
Chloride: 98 mEq/L (ref 96–112)
Creatinine, Ser: 0.66 mg/dL (ref 0.40–1.20)
Glucose, Bld: 84 mg/dL (ref 70–99)
Potassium: 4.1 mEq/L (ref 3.5–5.3)
Sodium: 136 mEq/L (ref 135–145)
Total Bilirubin: 0.6 mg/dL (ref 0.3–1.2)
Total Protein: 8 g/dL (ref 6.0–8.3)

## 2010-01-30 LAB — CBC WITH DIFFERENTIAL/PLATELET
BASO%: 0.5 % (ref 0.0–2.0)
Basophils Absolute: 0 10*3/uL (ref 0.0–0.1)
EOS%: 2.8 % (ref 0.0–7.0)
Eosinophils Absolute: 0.2 10*3/uL (ref 0.0–0.5)
HCT: 41.9 % (ref 34.8–46.6)
HGB: 14.4 g/dL (ref 11.6–15.9)
LYMPH%: 14.4 % (ref 14.0–49.7)
MCH: 34.8 pg — ABNORMAL HIGH (ref 25.1–34.0)
MCHC: 34.4 g/dL (ref 31.5–36.0)
MCV: 101.2 fL — ABNORMAL HIGH (ref 79.5–101.0)
MONO#: 0.5 10*3/uL (ref 0.1–0.9)
MONO%: 8.3 % (ref 0.0–14.0)
NEUT#: 4.3 10*3/uL (ref 1.5–6.5)
NEUT%: 74 % (ref 38.4–76.8)
Platelets: 260 10*3/uL (ref 145–400)
RBC: 4.14 10*6/uL (ref 3.70–5.45)
RDW: 13.2 % (ref 11.2–14.5)
WBC: 5.8 10*3/uL (ref 3.9–10.3)
lymph#: 0.8 10*3/uL — ABNORMAL LOW (ref 0.9–3.3)

## 2010-01-30 LAB — CEA: CEA: 5 ng/mL (ref 0.0–5.0)

## 2010-01-30 LAB — LACTATE DEHYDROGENASE: LDH: 193 U/L (ref 94–250)

## 2010-02-04 ENCOUNTER — Encounter: Payer: Self-pay | Admitting: Hematology and Oncology

## 2010-02-05 ENCOUNTER — Encounter: Payer: Self-pay | Admitting: Hematology and Oncology

## 2010-04-04 LAB — PROTIME-INR: INR: 1.04 (ref 0.00–1.49)

## 2010-05-29 NOTE — Op Note (Signed)
NAMEKOREEN, LIZAOLA               ACCOUNT NO.:  [id number]   MEDICAL RECORD NO.:  [id number]          PATIENT TYPE:  INP   LOCATION:  5124                         FACILITY:  [facility]   PHYSICIAN:  Ardeth Sportsman, MD     DATE OF BIRTH:  October 18, 1955   DATE OF PROCEDURE:  DATE OF DISCHARGE:                               OPERATIVE REPORT   PRIMARY CARE PHYSICIAN:  Joesph Fillers at [facility]  [facility].   GASTROENTEROLOGIST:  Graylin Shiver, MD   RADIATION ONCOLOGIST:  Artist Pais Kathrynn Running, MD   MEDICAL ONCOLOGIST:  Jamie Brookes. Odogwu, MD   SURGEON:  Ardeth Sportsman, MD   ASSISTANTS:  Anselm Pancoast. Zachery Dakins, MD and Wilmon Arms. Tsuei, MD   PREOPERATIVE DIAGNOSIS:  Rectal cancer status post neoadjuvant  chemoradiation therapy.   POSTOPERATIVE DIAGNOSIS:  Rectal cancer status post neoadjuvant  chemoradiation therapy.   PROCEDURE PERFORMED:  1. Laparoscopically assisted low anterior resection.  2. Laparoscopic splenic flexure mobilization.  3. Rigid proctoscopy.   ANESTHESIA:  1. General anesthesia.  2. Local aesthetic with a field block around port site.  3. On-Q pain pump.   SPECIMENS:  1. Sigmoid colon in proximal rectum.  Open end is proximal.  2. Anastomotic rings.  Blue 0 Prolene stitch is in proximal      anastomotic ring.   DRAINS:  A 19-French Blake drain rests along the left gutter into the  true pelvis.   ESTIMATED BLOOD LOSS:  200 mL.   COMPLICATIONS:  None major.   INDICATIONS:  Ms. Brackens is a 55 year old female who had increasing  bright red blood per rectum with increased stool volume and caliber, and  found to have a cancer in the proximal rectum.  She underwent  neoadjuvant chemoradiation therapy under the care of Dr. Vicente Serene Odogwu  as well as Dr. Margaretmary Dys.  She is now a month out from that.   Pathophysiology of rectal cancer were discussed.  After discussion,  recommendation was made for a laparoscopically-assisted segmental  resection of colon and rectum containing the cancer with anastomosis.  Risks, benefits, and alternatives were discussed.  Possibly, a diverting  ileostomy was discussed as well.  Options were discussed and she agreed  to proceed.   OPERATIVE FINDINGS:  She had a mass from about 12 to 15 cm primarily in  the posterior wall of her rectum at the rectosigmoid junction.  There  was no evidence of any metastatic disease.  Her bowel prep was fair.   DESCRIPTION OF PROCEDURE:  Informed consent was obtained.  The patient  received oral Entereg just prior to surgery.  She received subcutaneous  heparin prior to surgery.  Given her penicillin and vancomycin allergy,  she was given IV Cipro and Flagyl just prior to surgery.  She underwent  general anesthesia __________.  She had sequential compressive devices  active during the entire case.  She was positioned in low lithotomy with  her arms tucked and a belt on her chest, placed on a gel pad.   I started with rigid proctoscopy.  Her prep  was poor, but I was able to  clear out the rectal vault with little bit of irrigation suction for  removal of larger stool balls.  I went ahead and used [place] ink and  marked it at the proximal and at around 14-15 cm from the anal verge.   Dictation ended at this point.      Ardeth Sportsman, MD     SCG/MEDQ  D:  12/22/2007  T:  12/23/2007  Job:  [id number]   cc:   Vicente Serene I. Odogwu, M.D.  Artist Pais Kathrynn Running, M.D.  Graylin Shiver, M.D.  Joesph Fillers, MD

## 2010-05-29 NOTE — Discharge Summary (Signed)
Wendy [name], [name]               ACCOUNT NO.:  [id number]   MEDICAL RECORD NO.:  [id number]          PATIENT TYPE:  INP   LOCATION:  5126                         FACILITY:  [facility]   PHYSICIAN:  Wendy Sportsman, MD     DATE OF BIRTH:  04/19/1955   DATE OF ADMISSION:  12/22/2007  DATE OF DISCHARGE:                               DISCHARGE SUMMARY   PRIMARY CARE PHYSICIAN:  Wendy [name], M.D. and Wendy Anis, PA.   GASTROENTEROLOGIST:  Wendy [name], M.D.   MEDICAL ONCOLOGIST:  Wendy [name], M.D.   RADIATION ONCOLOGIST:  Wendy Pais. Kathrynn [name], M.D.   DIAGNOSIS:  Rectal cancer ( uT3uN0, ypT1ypN0M0).   OTHER DIAGNOSES:  1. Right internal jugular vein.  2. Deep venous thrombosis.  3. Status post right IJ port-A-Cath placement.  4. Hypertension.  5. Hepatitis C virus.  6. Anxiety.  7. Status post hysterectomy.  8. Status post appendectomy.  9. Status post tonsillectomy.  10.Tobacco abuse.  11.She was uT3, uN0 preoperative rectal cancer downgraded to ypT1,      ypM0.   PROCEDURE PERFORMED:  Laparoscopically assisted low anterior resection  with splenic flexure mobilization and descending colon to mid-rectal  anastomosis and rigid proctoscopy on December 22, 2007.   REASON FOR VISIT:  Wendy [name] is a 55 year old female who was found to  have a rectal cancer.  She presented with bright red blood per rectum  and has a strong family history of cancer in both parents.  She  underwent neo adjuvant radiation therapy and complicated by DVT and  right IJ for which she has been getting Lovenox shots, since this was  diagnosed just prior to surgery.   She underwent laparoscopic assisted resection, tolerated that relatively  well.  Postoperatively, she was placed on the alvimopan (Entereg)  protocol.  She was started on liquids at the time of discharge, she was  swallowing some solids and sanguine urine output and had her bowel  movement and flatus.  She was transitioned to oral pain  medications and  has had adequate pain control with this.  She was walking down the  hallways.  She was weaned to room air.  She had some drainage at her  incisions after the On-Q pain pump was removed and the Wics from  incision removed that seem to calm down.  She did have a surgical  drainage which I removed as well as since it was serosanguineous.   Based on the improvements and walking around, I felt that she could be  discharged home on postoperative day #3 with the following instructions;  1. She is to return to clinic and see me in about 1-1/2 to 2 weeks to      make sure she is continuing to improve.  2. She should follow up with Wendy [name] for management of her      anticoagulation.  I discussed with Wendy [name] yesterday.  She wants      the patient to come and see her early next week and continue with      her Lovenox shots.  She felt it  is reasonable to change to 1.5      mg\kg subcu shots daily, which transits to around 80 mg a day.  I      will have pharmacy double check and make sure that the patient      feels comfortable doing injections, which she had been doing at      home before.  She will probably get transitioned to Warfarin next      week.  3. The patient should call, if she has any fevers, chills, nausea,      vomiting, or worsening pain, drainage from incisions or other      concerns.  4. She should shower everyday and keep her incisions clean and dry and      call if she has worsening drainage, purulence, or any other      concerns.      Wendy Sportsman, MD  Electronically Signed     SCG/MEDQ  D:  12/25/2007  T:  12/25/2007  Job:  [id number]   cc:   Wendy [name], M.D.  Wendy [name], M.D.  Wendy [name], M.D.  Wendy [name], M.D.  Dr. Elpidio [name]

## 2010-05-29 NOTE — Op Note (Signed)
Wendy [name], Wendy [name]               ACCOUNT NO.:  [id number]   MEDICAL RECORD NO.:  [id number]          PATIENT TYPE:  INP   LOCATION:  5124                         FACILITY:  [facility]   PHYSICIAN:  Ardeth Sportsman, MD     DATE OF BIRTH:  Jul 09, 1955   DATE OF PROCEDURE:  DATE OF DISCHARGE:                               OPERATIVE REPORT   PRIMARY CARE PHYSICIAN:  Brett Canales A. Cleta Alberts, M.D. and Dr. Joesph Fillers at  [facility] in [place]   GASTROENTEROLOGIST:  Graylin Shiver, MD.   MEDICAL ONCOLOGIST:  Jamie Brookes. Odogwu, M.D.   RADIATION ONCOLOGIST:  Artist Pais. Kathrynn Running, M.D.   SURGEON:  Ardeth Sportsman, MD.   ASSISTANT:  1. Anselm Pancoast. Zachery Dakins, MD  2. Wilmon Arms. Tsuei, MD   PREOPERATIVE DIAGNOSIS:  Rectal cancer.   POSTOPERATIVE DIAGNOSIS:  Rectal cancer.   PROCEDURE:  1. Laparoscopically assisted low anterior resection.  2. Laparoscopic mobilization of the splenic flexure of the colon.  3. Rigid proctoscopy.   ANESTHESIA:  1. General anesthesia.  2. Local anesthetic and a field block for all port sites.  3. On-Q pain pump.   DRAINS:  An 19-French [place] Mons drain rests in the true pelvis between the  left pericolic gutter down into the pelvis.   ESTIMATED BLOOD LOSS:  200 mL.   COMPLICATIONS:  None.   INDICATIONS:  Wendy [name] is a 55 year old female who had worsening  bright red blood per rectum and change in her stools and was found to  have a proximal rectal cancer.  She underwent neoadjuvant chemoradiation  therapy and tolerated this relatively well with some issues with  diarrhea.   Pathophysiology of rectal cancer was discussed, options was discussed,  recommendations were made for laparoscopically assisted possible for  resection of rectum and segmental colonic resection with high vessel  ligation.  Risks, benefits, and alternatives were discussed.  Possibility of an ostomy was discussed as well.  Questions answered and  she agreed to proceed.   On  the day of surgery, she was delayed for several hours secondary to  transportation issues and some diarrhea issues, before she came here,  although was about 4 hours late.   OPERATIVE FINDINGS:  She had a mass in the proximal rectum 11-14cm from  the anus by rigid proctoscopy.  I had appropriately marked it.  It did  not seem to be invading any other structures.  There was no evidence of  any metastatic disease.   DESCRIPTION OF PROCEDURE:  Informed consent was confirmed.  The patient  underwent general anesthesia without any difficulty.  She had received  oral  Entereg prior to surgery.  She received subcutaneous heparin prior  to surgery.  She had sequential compression devices active during the  entire case.  She received IV Flagyl and ciprofloxacin, given her  numerous allergies to penicillin and vancomycin and she tolerated them  well.   She was positioned in low lithotomy with arms tucked.  I performed rigid  proctoscopy first.  The bowel prep was fair with some solid stool balls,  then I was able to evacuate those.  I wound up marking proximal to the  tumor at around 14 cm.  Gas was evacuated.   The patient's abdomen was prepped and draped in sterile fashion.  A 5-mm  port was placed in the right upper quadrant using optical entering  technique with the patient in steep reverse Trendelenburg and right side  up.  Careful inspection revealed no intraabdominal injury.  Under  visualization, 5 mm ports were ports were placed in the right lower  quadrant and the left flank.  A 12 mm port was placed infraumbilically.  Meticulous camera dissection was done and there was no evidence of any  metastatic disease on liver and peritoneum.  The parietal and visceral  peritoneum  appeared to be normal.   She had some omental adhesions under her left lower quadrant.  These  were freed off to help reflect the greater omentum.  The patient was  positioned head down, right side down, the small  bowel was freed now.  Her terminal ileum and cecum, however, were flopping down in the pelvis.  I mobilized the right side of the colon up to the mid ascending colon  and pulling it to the mesentery to help reflect it out of the way.   The patient had some side wall adhesions of her sigmoid colon that  helped elevate this sigmoid colon.  The mesentery of the left colon was  scored a few centimeters anterior to the aorta starting out right at the  superior hemorrhoidal artery, but eventually carrying it all the way  down to the peritoneal reflexion and carrying it all the way proximally  up to the ligament of Treitz.  The sigmoid mesentery was elevated  anteriorly.  Initially I was a little too anterior, so I redirected it a  little more posteriorly and got underneath the superior hemorrhoidal  artery.  I got into the retro-mesenteric plane at Toldt's fascia.  With  that, we could see her left ureter and gonadal vessels.  These were kept  retroperitoneal.  The sigmoid mesentery was elevated all the way  laterally to the abdominal sidewall on the flank.  We went ahead and  freed and got down to the level of the sacral promontory.  Nerve  complexes were seen and preserved though there was some dense  inflammation around this region due to her prior radiation.   I ended up mobilizing the left colon in a lateral-medial fashion first  starting at the sigmoid colon using sharp scissors and gradually rolling  it out.  Again, we met up with a retro-mesenteric dissection and saw the  retroperitoneal structures including the left ureter which was preserved  at all times.   The patient was brought a little more head up as we did splenic flexure  mobilization moving the left colon over.  There were some dense  adhesions of the greater omentum in the left upper quadrant and near the  spleen.  However, I was able to get through the retro-mesenteric plane  and help push the colon medially and get good  freeing posteriorly before  we isolated it.  The splenic flexure of the colon at the mid transverse  colon, ended up elevating the greater omentum and getting a window in  between the greater omentum and the transverse colon into the lesser  sac.  We were able to free the distal transverse colon to the splenic  flexure in a superior to inferior fashion, keeping  greater omentum on  the stomach.  There was few retro-mesenteric attachments to the inferior  border of the pancreas and kidneys which were carefully isolated,  skeletonized, and freed off.  Finally, the inferior mesenteric vein was  isolated and ligated just proximal to the ligament of Treitz.  With  that, we had excellent mobilization of the entire left colon.   At this point, we went ahead and placed a wound protector through her  prior Pfannenstiel incision about 7 cm in length.  With that, I could  get my hand in and feel exactly where the inflammation and where the  markings were.  The mass was distal to where I had had the [place] ink so  I ended up freeing off the peritoneal flexion a little more distally and  made a window at the level of the mid rectum.  The mid rectum was  transected using a 30-Contour stapler.  It was clamped for a minute,  fired, held for 30 seconds, and released.  The remaining mesorectum was  carefully freed off and transected as well.  Both ureters were seen on  both sides preserved at all times.  With this, we were able to  eviscerate the left colon quite well.  There was a good feeding marginal  artery of Drummond to the descending colon/sigmoid colon junction, so I  chose that as the proximal mergin site.  We ended up taking the  mesentery in a ray-like fashion to include the inferior mesenteric  artery and vein high ligations.  The colon was transected with a scalpel  at the descending/sigmoid junction.  The colon was sent off.   EEA sizers was used and a 25 fit easily, 29 was snug.  A 0-Prolene  purse-  string stitch was made at the descending colon and tied down around an  25 EEA anvil.   Dr. Corliss Skains went down below.  There was some decent amount of stool in the  rectum and this was carefully irrigated and washed out and rigid  proctoscopy revealed no active bleeding or other masses.  After gentle  finger dilation, he was able to pass the 29 stapler easily up into the  rectum.  I did a little more anterior mobilization in the pelvis off the  anterior rectal stump to get a better clear view.  A spike was brought  out through the proximal end of the rectal stump.  The 29 EEA staple  anastomosis was done after bringing the anvil down, holding it, fired,  held for 30 seconds, and released.  We had two excellent anastomotic  rings.  These were sent off as well.   A rigid proctoscopy was done and there was no evidence of any bleeding  at the anastomosis.  A good insufflation was done under water, and there  was no evidence of leak or bubbles.  Anastomosis looked viable.  The  mesentery was not turned or twisted.  The wound retractor was clamped  and capnoperitoneum was reintroduced.  Inspection revealed no active  bleeding and copious irrigation done with several liters with clear  return at the end.  We placed a drain as noted above.  Capnoperitoneum  was evacuated.  Ports were removed.  The infraumbilical port site was  closed using a 0-Vicryl stitch to good result.  On-Q pain pump catheters  were placed in the retrorectal/preperitoneal plane from the subcostal  region all the way down to the pubi.  The Pfannenstiel wound was closed  in  2 layers using a 0-Vicryl to close the posterior fascia and  peritoneum vertically and then the anterior rectus fascia was closed  transversely using #1 PDS.  Skin was closed using 4-0 Monocryl  interrupted stitches and Wics were placed.  Telfa Wics were placed in  the Pfannenstiel wound.  Triple antibiotic cream was placed.  Sterile  dressings were  applied.   The patient was extubated and sent to recovery room in stable condition.  There was no one here to discuss postoperative findings with the  patient, and she  confessed this.  I will try and get hold of someone  later, but I had not had any success just as yet.  I did discuss  postoperative care with the patient in detail prior to surgery.      Ardeth Sportsman, MD  Electronically Signed     SCG/MEDQ  D:  12/22/2007  T:  12/23/2007  Job:  [id number]   cc:   Graylin Shiver, M.D.  Dr. Joesph Fillers  Lauretta I. Odogwu, M.D.  Artist Pais Kathrynn Running, M.D.

## 2010-07-25 ENCOUNTER — Other Ambulatory Visit: Payer: Self-pay | Admitting: Hematology and Oncology

## 2010-07-25 ENCOUNTER — Encounter (HOSPITAL_BASED_OUTPATIENT_CLINIC_OR_DEPARTMENT_OTHER): Payer: BC Managed Care – PPO | Admitting: Hematology and Oncology

## 2010-07-25 DIAGNOSIS — Z7901 Long term (current) use of anticoagulants: Secondary | ICD-10-CM

## 2010-07-25 DIAGNOSIS — Z86718 Personal history of other venous thrombosis and embolism: Secondary | ICD-10-CM

## 2010-07-25 DIAGNOSIS — C20 Malignant neoplasm of rectum: Secondary | ICD-10-CM

## 2010-07-25 LAB — CBC WITH DIFFERENTIAL/PLATELET
BASO%: 0.4 % (ref 0.0–2.0)
EOS%: 4.5 % (ref 0.0–7.0)
HCT: 38.7 % (ref 34.8–46.6)
LYMPH%: 18.3 % (ref 14.0–49.7)
MCH: 36.8 pg — ABNORMAL HIGH (ref 25.1–34.0)
MCHC: 34.6 g/dL (ref 31.5–36.0)
NEUT%: 69.2 % (ref 38.4–76.8)
Platelets: 269 10*3/uL (ref 145–400)
RBC: 3.64 10*6/uL — ABNORMAL LOW (ref 3.70–5.45)
WBC: 4.2 10*3/uL (ref 3.9–10.3)
lymph#: 0.8 10*3/uL — ABNORMAL LOW (ref 0.9–3.3)

## 2010-07-25 LAB — COMPREHENSIVE METABOLIC PANEL
ALT: 14 U/L (ref 0–35)
Albumin: 4.7 g/dL (ref 3.5–5.2)
Alkaline Phosphatase: 53 U/L (ref 39–117)
Glucose, Bld: 109 mg/dL — ABNORMAL HIGH (ref 70–99)
Potassium: 4.1 mEq/L (ref 3.5–5.3)
Sodium: 138 mEq/L (ref 135–145)
Total Bilirubin: 0.5 mg/dL (ref 0.3–1.2)
Total Protein: 7.3 g/dL (ref 6.0–8.3)

## 2010-08-01 ENCOUNTER — Encounter (HOSPITAL_BASED_OUTPATIENT_CLINIC_OR_DEPARTMENT_OTHER): Payer: BC Managed Care – PPO | Admitting: Hematology and Oncology

## 2010-08-01 DIAGNOSIS — C20 Malignant neoplasm of rectum: Secondary | ICD-10-CM

## 2010-10-17 LAB — PROTIME-INR
INR: 1
Prothrombin Time: 13

## 2010-10-17 LAB — CBC
Hemoglobin: 15.6 — ABNORMAL HIGH
MCHC: 33.8
Platelets: 303
RDW: 13.6

## 2010-10-17 LAB — GLUCOSE, CAPILLARY: Glucose-Capillary: 95

## 2010-10-19 LAB — CBC
HCT: 31.9 % — ABNORMAL LOW (ref 36.0–46.0)
Hemoglobin: 10.8 g/dL — ABNORMAL LOW (ref 12.0–15.0)
MCV: 104.2 fL — ABNORMAL HIGH (ref 78.0–100.0)
MCV: 106 fL — ABNORMAL HIGH (ref 78.0–100.0)
Platelets: 310 10*3/uL (ref 150–400)
RBC: 3.65 MIL/uL — ABNORMAL LOW (ref 3.87–5.11)
RDW: 15.8 % — ABNORMAL HIGH (ref 11.5–15.5)
WBC: 5.5 10*3/uL (ref 4.0–10.5)

## 2010-10-19 LAB — BASIC METABOLIC PANEL
BUN: 10 mg/dL (ref 6–23)
Calcium: 9.5 mg/dL (ref 8.4–10.5)
Creatinine, Ser: 0.61 mg/dL (ref 0.4–1.2)
GFR calc Af Amer: >60 mL/min (ref >60)
GFR calc non Af Amer: >60 mL/min (ref >60)

## 2010-10-19 LAB — HEPATIC FUNCTION PANEL
ALT: 14 U/L (ref 0–35)
AST: 16 U/L (ref 0–37)
Albumin: 3.9 g/dL (ref 3.5–5.2)
Alkaline Phosphatase: 65 U/L (ref 39–117)
Total Protein: 6.9 g/dL (ref 6.0–8.3)

## 2010-10-19 LAB — HEMOGLOBIN: Hemoglobin: 11 g/dL — ABNORMAL LOW (ref 12.0–15.0)

## 2010-10-19 LAB — CREATININE, SERUM
Creatinine, Ser: 0.56 mg/dL (ref 0.4–1.2)
GFR calc Af Amer: >60 mL/min (ref >60)
GFR calc non Af Amer: >60 mL/min (ref >60)

## 2010-10-19 LAB — HEMOGLOBIN AND HEMATOCRIT, BLOOD
HCT: 29.4 % — ABNORMAL LOW (ref 36.0–46.0)
Hemoglobin: 10.1 g/dL — ABNORMAL LOW (ref 12.0–15.0)

## 2010-10-19 LAB — POTASSIUM
Potassium: 3.7 mEq/L (ref 3.5–5.1)
Potassium: 3.8 mEq/L (ref 3.5–5.1)

## 2011-01-09 ENCOUNTER — Other Ambulatory Visit: Payer: Self-pay | Admitting: Hematology and Oncology

## 2011-01-09 DIAGNOSIS — C20 Malignant neoplasm of rectum: Secondary | ICD-10-CM

## 2011-01-17 ENCOUNTER — Telehealth: Payer: Self-pay | Admitting: Hematology and Oncology

## 2011-01-17 NOTE — Telephone Encounter (Signed)
lmonvm adviising the pt of her lab-ct scan appt along with the md visit in jan 2013.l

## 2011-01-30 ENCOUNTER — Ambulatory Visit: Payer: BC Managed Care – PPO | Admitting: Hematology and Oncology

## 2011-01-31 ENCOUNTER — Ambulatory Visit (HOSPITAL_COMMUNITY)
Admission: RE | Admit: 2011-01-31 | Discharge: 2011-01-31 | Disposition: A | Discharge status: Home / Self Care | Payer: BC Managed Care – PPO | Source: Ambulatory Visit | Attending: Hematology and Oncology | Admitting: Hematology and Oncology

## 2011-01-31 ENCOUNTER — Other Ambulatory Visit (HOSPITAL_BASED_OUTPATIENT_CLINIC_OR_DEPARTMENT_OTHER): Payer: BC Managed Care – PPO | Admitting: Lab

## 2011-01-31 DIAGNOSIS — Z923 Personal history of irradiation: Secondary | ICD-10-CM | POA: Insufficient documentation

## 2011-01-31 DIAGNOSIS — Z7901 Long term (current) use of anticoagulants: Secondary | ICD-10-CM

## 2011-01-31 DIAGNOSIS — C20 Malignant neoplasm of rectum: Secondary | ICD-10-CM

## 2011-01-31 DIAGNOSIS — Z86718 Personal history of other venous thrombosis and embolism: Secondary | ICD-10-CM

## 2011-01-31 DIAGNOSIS — Z9221 Personal history of antineoplastic chemotherapy: Secondary | ICD-10-CM | POA: Insufficient documentation

## 2011-01-31 DIAGNOSIS — N281 Cyst of kidney, acquired: Secondary | ICD-10-CM | POA: Insufficient documentation

## 2011-01-31 DIAGNOSIS — N289 Disorder of kidney and ureter, unspecified: Secondary | ICD-10-CM | POA: Insufficient documentation

## 2011-01-31 LAB — CMP (CANCER CENTER ONLY)
ALT(SGPT): 24 U/L (ref 10–47)
AST: 24 U/L (ref 11–38)
Albumin: 4 g/dL (ref 3.3–5.5)
Alkaline Phosphatase: 68 U/L (ref 26–84)
Potassium: 3.9 mEq/L (ref 3.3–4.7)
Sodium: 140 mEq/L (ref 128–145)
Total Protein: 7.7 g/dL (ref 6.4–8.1)

## 2011-01-31 LAB — CBC WITH DIFFERENTIAL/PLATELET
EOS%: 2.4 % (ref 0.0–7.0)
MCH: 37 pg — ABNORMAL HIGH (ref 25.1–34.0)
MCV: 105.8 fL — ABNORMAL HIGH (ref 79.5–101.0)
MONO%: 4.1 % (ref 0.0–14.0)
NEUT#: 5.4 10*3/uL (ref 1.5–6.5)
RBC: 3.79 10*6/uL (ref 3.70–5.45)
RDW: 13.6 % (ref 11.2–14.5)

## 2011-01-31 MED ORDER — IOHEXOL 300 MG/ML  SOLN: 100.0000 mL | Freq: Once | INTRAMUSCULAR | Status: AC | PRN

## 2011-02-04 ENCOUNTER — Encounter: Payer: Self-pay | Admitting: *Deleted

## 2011-02-05 ENCOUNTER — Telehealth: Payer: Self-pay | Admitting: Hematology and Oncology

## 2011-02-05 ENCOUNTER — Ambulatory Visit (HOSPITAL_BASED_OUTPATIENT_CLINIC_OR_DEPARTMENT_OTHER): Payer: BC Managed Care – PPO | Admitting: Hematology and Oncology

## 2011-02-05 VITALS — BP 136/79 | HR 71 | Temp 97.4°F | Wt 106.6 lb

## 2011-02-05 DIAGNOSIS — C20 Malignant neoplasm of rectum: Secondary | ICD-10-CM

## 2011-02-05 NOTE — Progress Notes (Signed)
This office note has been dictated.

## 2011-02-05 NOTE — Telephone Encounter (Signed)
Gv pt appt for sept2013.  Gv pt copy of CXR orders for sept

## 2011-02-05 NOTE — Progress Notes (Signed)
CC:   Stan Head. Cleta Alberts, M.D. Graylin Shiver, M.D.  IDENTIFYING PATIENT:  The patient is a 56 year old woman with rectal cancer presents for followup.  INTERVAL HISTORY:  Mrs. Hileman was seen 6-7 months ago.  Has had no major issues or concerns since her last followup visit.  Has not experienced any change in bowel function.  Denies abdominal pain or rectal bleeding.  Has no nausea or vomiting.  Her weight remains stable. She recently had scans and we reviewed those results.  CT scan of the chest, abdomen, and pelvis obtained 0/07/2011, showed stable 7-mm right apical nodule.  No mediastinal hilar adenopathy.  Subtle esophageal air fluid level.  With no acute process or evidence of metastatic disease in the within the chest.  Abdomen showed normal liver without abdominal or pelvic adenopathy.  MEDICATIONS:  Reviewed and updated.  ALLERGIES:  Amoxicillin and vancomycin.  PAST MEDICAL HISTORY, FAMILY HISTORY, AND SOCIAL HISTORY:  Unchanged.  REVIEW OF SYSTEMS:  A 10-point review of systems negative.  PHYSICAL EXAM:  General: The patient is a well-appearing, well- nourished, woman in no distress.  Vitals: Pulse 71, blood pressure 136/79, temperature 97.4, respirations 20, and weight 106 pounds. HEENT: Head is atraumatic, normocephalic.  Sclerae anicteric.  Mouth moist.  Chest:  Clear.  CVS: Unremarkable.  Abdomen:  Soft, nontender. Bowel sounds present.  Extremities: No calf tenderness.  No edema was present, symmetrical.  CNS: Nonfocal.  LAB DATA:  01/21/2011:  White count 6.6, hemoglobin 14, hematocrit 40.1, and platelets 255.  Sodium 140, potassium 3.9, chloride 97, CO2 27, BUN 16, creatinine 0.4, and glucose 99. T bilirubin 0.7, alkaline phosphatase 68, AST 24, ALT 24, calcium 8.8, and CEA is 4.8.  IMAGING DATA:  Results of CT as in interval history.  IMPRESSION AND PLAN:  Mrs. 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  None of the 20 lymph nodes sampled had evidence of tumor.  Surgical specimen showed residual foci of adenocarcinoma in the submucosa.  Began external radiation therapy with continuous infusion 5-fluorouracil from September 24, 2007 through October 20, 2007.  Then received a laparoscopic-assisted low anterior resection with mobilization of splenic flexure, and repeat proctoscopy on December 22, 2007.  Then went on to receive adjuvant therapy in form of [place]amond regimen from 02/10/2008 to 04/17/2008.  She required dose reduction by 50% with the last cycle on account of hand-foot syndrome. She also had a history of a right IJV DVT and was on Coumadin for a period of time.  Mrs. Tobin' exam including her CT scans indicate no evidence of recurrence.  She has since had her port removed.  She is scheduled for a followup visit with Oncology in 9 months' time.  This will also include lab work and a chest x-ray.  She is also being referred to see Dr. Herbert Moors for a colonoscopy this summer.  Spent more than half the time coordinating care.    ______________________________ Laurice Record, M.D. LIO/MEDQ  D:  02/05/2011  T:  02/05/2011  Job:  [id number]

## 2011-02-06 ENCOUNTER — Encounter: Payer: Self-pay | Admitting: Family Medicine

## 2011-02-06 DIAGNOSIS — G43909 Migraine, unspecified, not intractable, without status migrainosus: Secondary | ICD-10-CM | POA: Insufficient documentation

## 2011-02-06 DIAGNOSIS — B182 Chronic viral hepatitis C: Secondary | ICD-10-CM | POA: Insufficient documentation

## 2011-02-06 DIAGNOSIS — Z86718 Personal history of other venous thrombosis and embolism: Secondary | ICD-10-CM | POA: Insufficient documentation

## 2011-02-06 DIAGNOSIS — B009 Herpesviral infection, unspecified: Secondary | ICD-10-CM | POA: Insufficient documentation

## 2011-02-25 ENCOUNTER — Telehealth: Payer: Self-pay | Admitting: Hematology and Oncology

## 2011-02-25 NOTE — Telephone Encounter (Signed)
called [facility] to scheduled appt with Dr. Evette Cristal for colonscopy for [phone] and per courtney they will mail pt a letter to call there office to schedule appt with there office

## 2011-03-06 ENCOUNTER — Encounter: Payer: Self-pay | Admitting: Physician Assistant

## 2011-03-06 ENCOUNTER — Ambulatory Visit (INDEPENDENT_AMBULATORY_CARE_PROVIDER_SITE_OTHER): Payer: BC Managed Care – PPO | Admitting: Physician Assistant

## 2011-03-06 DIAGNOSIS — R209 Unspecified disturbances of skin sensation: Secondary | ICD-10-CM

## 2011-03-06 DIAGNOSIS — R202 Paresthesia of skin: Secondary | ICD-10-CM

## 2011-03-06 DIAGNOSIS — I10 Essential (primary) hypertension: Secondary | ICD-10-CM

## 2011-03-06 DIAGNOSIS — C20 Malignant neoplasm of rectum: Secondary | ICD-10-CM

## 2011-03-06 DIAGNOSIS — D7589 Other specified diseases of blood and blood-forming organs: Secondary | ICD-10-CM

## 2011-03-06 MED ORDER — LISINOPRIL-HYDROCHLOROTHIAZIDE 10-12.5 MG PO TABS: 1.0000 | ORAL_TABLET | Freq: Every day | ORAL | Status: DC

## 2011-03-06 MED ORDER — ACYCLOVIR 200 MG PO CAPS: 200.0000 mg | ORAL_CAPSULE | Freq: Every day | ORAL | Status: DC

## 2011-03-06 MED ORDER — ALPRAZOLAM 0.5 MG PO TABS: 0.5000 mg | ORAL_TABLET | Freq: Every evening | ORAL | Status: DC | PRN

## 2011-03-06 MED ORDER — SUMATRIPTAN SUCCINATE 100 MG PO TABS: 100.0000 mg | ORAL_TABLET | ORAL | Status: DC | PRN

## 2011-03-06 NOTE — Progress Notes (Signed)
  Subjective:    Patient ID: Wendy [name], female    DOB: [birth year], 56 y.o.   MRN: [id number]  HPI CT scan in Jan, everything was good. Paresthesias in B arms and legs.  Drinking 4-6 alcohol drinks/day. Tried AA once about 6 months ago.  Hasn't considered formal treatment.  Review of Systems  All other systems reviewed and are negative.       Objective:   Physical Exam  Constitutional: She is oriented to person, place, and time. She appears well-developed.       Very thin, likely some degree of malnutrition  HENT:  Head: Normocephalic and atraumatic.  Neck: Normal range of motion. Neck supple. No thyromegaly present.  Cardiovascular: Normal rate, regular rhythm and normal heart sounds.  Exam reveals no gallop.   No murmur heard. Pulmonary/Chest: Effort normal and breath sounds normal.  Abdominal: Soft. Bowel sounds are normal.  Neurological: She is alert and oriented to person, place, and time. She has normal reflexes. She displays normal reflexes. No cranial nerve deficit.          Assessment & Plan:  htn-suboptimal control.  Being exacerbated by her alcohol consumption.  Advised AA again. Gave numbers.  Gave other resources. Offered to set up a tour at [facility]. Anxiety-unchanged. Migraines-stable w/ occasional immitrex. Alcohol addiction-really needs treatment, not ready Paresthesias-check labs GI- Cancer-in remission, sees oncologist and GI twice yearly.

## 2011-03-07 LAB — CBC WITH DIFFERENTIAL/PLATELET
Basophils Absolute: 0.1 10*3/uL (ref 0.0–0.1)
Basophils Relative: 1 % (ref 0–1)
Eosinophils Absolute: 0.3 10*3/uL (ref 0.0–0.7)
Eosinophils Relative: 7 % — ABNORMAL HIGH (ref 0–5)
Lymphocytes Relative: 26 % (ref 12–46)
MCV: 104.5 fL — ABNORMAL HIGH (ref 78.0–100.0)
Platelets: 280 10*3/uL (ref 150–400)
RDW: 13 % (ref 11.5–15.5)
WBC: 4.1 10*3/uL (ref 4.0–10.5)

## 2011-03-07 LAB — COMPREHENSIVE METABOLIC PANEL
ALT: 20 U/L (ref 0–35)
BUN: 8 mg/dL (ref 6–23)
CO2: 24 mEq/L (ref 19–32)
Calcium: 9.1 mg/dL (ref 8.4–10.5)
Chloride: 106 mEq/L (ref 96–112)
Creat: 0.52 mg/dL (ref 0.50–1.10)

## 2011-03-07 LAB — TSH: TSH: 0.641 u[IU]/mL (ref 0.350–4.500)

## 2011-09-20 ENCOUNTER — Other Ambulatory Visit: Payer: Self-pay | Admitting: *Deleted

## 2011-09-25 ENCOUNTER — Telehealth: Payer: Self-pay | Admitting: Hematology and Oncology

## 2011-09-25 NOTE — Telephone Encounter (Signed)
Moved 9/24 appt to 9/26 w/[facility] due to LO out - per LO move to [facility] on day she is in office. Lb remains 9/20. Not able to reach pt by phone or lm. Schedule mailed.

## 2011-09-30 ENCOUNTER — Other Ambulatory Visit: Payer: Self-pay | Admitting: *Deleted

## 2011-10-04 ENCOUNTER — Other Ambulatory Visit: Payer: BC Managed Care – PPO

## 2011-10-08 ENCOUNTER — Ambulatory Visit: Payer: BC Managed Care – PPO | Admitting: Hematology and Oncology

## 2011-10-10 ENCOUNTER — Ambulatory Visit: Payer: BC Managed Care – PPO | Admitting: Family

## 2011-11-05 ENCOUNTER — Telehealth: Payer: Self-pay | Admitting: *Deleted

## 2011-11-05 NOTE — Telephone Encounter (Signed)
Mailed out calendar to inform the patient of the new date and time of the rescheduled appointment

## 2011-12-06 ENCOUNTER — Other Ambulatory Visit: Payer: Self-pay | Admitting: *Deleted

## 2011-12-06 ENCOUNTER — Telehealth: Payer: Self-pay | Admitting: *Deleted

## 2011-12-06 NOTE — Telephone Encounter (Signed)
Attempted to call pt at number listed -  Pt no longer works at [employer].   Called son Ines Bloomer and left message on voice mail asking pt to call nurse back to reschedule f/u appt. [name]'s   Phone     [phone].

## 2011-12-10 ENCOUNTER — Telehealth: Payer: Self-pay | Admitting: Nurse Practitioner

## 2011-12-10 ENCOUNTER — Telehealth: Payer: Self-pay | Admitting: *Deleted

## 2011-12-10 NOTE — Telephone Encounter (Signed)
Called listed number- per female answering- pt no longer at number.

## 2011-12-10 NOTE — Telephone Encounter (Signed)
left voice message on son's phone to inform the patient of the new date and time on 12-20-2011 at 9:30am patient's number [phone] patient does not work at the place anymore according to the gentleman that anwsere

## 2011-12-10 NOTE — Telephone Encounter (Signed)
Also called pt son- listed as Emergency contact- left message requesting return call to this office to receive appointment information.  Will also send letter.

## 2011-12-11 ENCOUNTER — Ambulatory Visit: Payer: BC Managed Care – PPO | Admitting: Family

## 2011-12-11 ENCOUNTER — Other Ambulatory Visit: Payer: Self-pay | Admitting: *Deleted

## 2011-12-20 ENCOUNTER — Ambulatory Visit (HOSPITAL_COMMUNITY)
Admission: RE | Admit: 2011-12-20 | Discharge: 2011-12-20 | Disposition: A | Discharge status: Home / Self Care | Payer: Self-pay | Source: Ambulatory Visit | Attending: Hematology and Oncology | Admitting: Hematology and Oncology

## 2011-12-20 ENCOUNTER — Telehealth: Payer: Self-pay | Admitting: Nurse Practitioner

## 2011-12-20 ENCOUNTER — Encounter: Payer: Self-pay | Admitting: Hematology and Oncology

## 2011-12-20 ENCOUNTER — Ambulatory Visit (HOSPITAL_BASED_OUTPATIENT_CLINIC_OR_DEPARTMENT_OTHER): Payer: Self-pay | Admitting: Hematology and Oncology

## 2011-12-20 ENCOUNTER — Telehealth: Payer: Self-pay | Admitting: Hematology and Oncology

## 2011-12-20 ENCOUNTER — Other Ambulatory Visit (HOSPITAL_BASED_OUTPATIENT_CLINIC_OR_DEPARTMENT_OTHER): Payer: Self-pay

## 2011-12-20 VITALS — BP 143/87 | HR 64 | Temp 98.4°F | Resp 20 | Ht 62.0 in | Wt 113.2 lb

## 2011-12-20 DIAGNOSIS — Z86718 Personal history of other venous thrombosis and embolism: Secondary | ICD-10-CM

## 2011-12-20 DIAGNOSIS — F17200 Nicotine dependence, unspecified, uncomplicated: Secondary | ICD-10-CM | POA: Insufficient documentation

## 2011-12-20 DIAGNOSIS — C20 Malignant neoplasm of rectum: Secondary | ICD-10-CM

## 2011-12-20 DIAGNOSIS — C7A026 Malignant carcinoid tumor of the rectum: Secondary | ICD-10-CM

## 2011-12-20 DIAGNOSIS — R0602 Shortness of breath: Secondary | ICD-10-CM | POA: Insufficient documentation

## 2011-12-20 LAB — CBC WITH DIFFERENTIAL/PLATELET
BASO%: 0.9 % (ref 0.0–2.0)
EOS%: 2.8 % (ref 0.0–7.0)
HGB: 14.5 g/dL (ref 11.6–15.9)
MCH: 36.4 pg — ABNORMAL HIGH (ref 25.1–34.0)
MCHC: 34.9 g/dL (ref 31.5–36.0)
RBC: 3.98 10*6/uL (ref 3.70–5.45)
RDW: 13.5 % (ref 11.2–14.5)
lymph#: 0.9 10*3/uL (ref 0.9–3.3)

## 2011-12-20 LAB — COMPREHENSIVE METABOLIC PANEL (CC13)
ALT: 19 U/L (ref 0–55)
AST: 22 U/L (ref 5–34)
Albumin: 4.4 g/dL (ref 3.5–5.0)
Alkaline Phosphatase: 75 U/L (ref 40–150)
Calcium: 9.8 mg/dL (ref 8.4–10.4)
Chloride: 100 mEq/L (ref 98–107)
Potassium: 4.8 mEq/L (ref 3.5–5.1)
Sodium: 137 mEq/L (ref 136–145)

## 2011-12-20 NOTE — Telephone Encounter (Signed)
Called patient and left message requesting a return call to this office to inform per Dr. 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

## 2011-12-20 NOTE — Progress Notes (Signed)
Patient came in. She is not working right now. She is getting unemployment. I gave her an application for medicaid and financial assistance. It is just her and her child. I advised her of the address to return the financial application and gave her directions to DSS for the medicaid.

## 2011-12-20 NOTE — Telephone Encounter (Signed)
appts made and printed for pt Pt sent for xray Pt has been called from dr ganem but they had been unable to reach her.  Wendy [name] the nurse assist. Will call the pt with her ?s and will set up the screening

## 2011-12-20 NOTE — Progress Notes (Signed)
This office note has been dictated.

## 2011-12-20 NOTE — Patient Instructions (Addendum)
SHAUNDRA FULLAM  [id number]   [facility] - AFTER VISIT SUMMARY   **RECOMMENDATIONS MADE BY THE CONSULTANT AND ANY TEST    RESULTS WILL BE SENT TO YOUR REFERRING DOCTORS.   YOUR EXAM FINDINGS, LABS AND RESULTS WERE DISCUSSED BY YOUR MD TODAY.  YOU CAN GO TO THE [place] WEB SITE FOR INSTRUCTIONS ON HOW TO ASSESS MY CHART FOR ADDITIONAL INFORMATION AS NEEDED.  Your Updated drug allergies are: Allergies as of 12/20/2011 - Review Complete 12/20/2011  Allergen Reaction Noted  . Amoxicillin Rash 02/04/2011  . Vancomycin Itching 02/04/2011    Your current list of medications are: Current Outpatient Prescriptions  Medication Sig Dispense Refill  . acyclovir (ZOVIRAX) 200 MG capsule Take 1 capsule (200 mg total) by mouth daily. Takes  1 - 2 tablets daily.  60 capsule  5  . ALPRAZolam (XANAX) 0.5 MG tablet Take 1 tablet (0.5 mg total) by mouth at bedtime as needed.  30 tablet  5  . cetirizine (ZYRTEC) 10 MG tablet Take 10 mg by mouth as needed.      Marland Kitchen ibuprofen (ADVIL,MOTRIN) 200 MG tablet Take 200 mg by mouth as needed.      Marland Kitchen lisinopril-hydrochlorothiazide (PRINZIDE,ZESTORETIC) 10-12.5 MG per tablet Take 1 tablet by mouth daily.  90 tablet  1  . SUMAtriptan (IMITREX) 100 MG tablet Take 1 tablet (100 mg total) by mouth every 2 (two) hours as needed.  10 tablet  5     INSTRUCTIONS GIVEN AND DISCUSSED:  See attached schedule   SPECIAL INSTRUCTIONS/FOLLOW-UP:  See above.  I acknowledge that I have been informed and understand all the instructions given to me and received a copy.I know to contact the clinic, my physician, or go to the emergency Department if any problems should occur.   I do not have any more questions at this time, but understand that I may call the [facility] at [phone] during business hours should I have any further questions or need assistance in obtaining follow-up care.

## 2011-12-20 NOTE — Telephone Encounter (Signed)
Message copied by [name] Cower on Fri Dec 20, 2011  2:13 PM ------      Message from: Arlan Organ I      Created: Fri Dec 20, 2011  1:40 PM       Call pt CXR shows COPD as a result of smoking.  Did the labs draw a CEA level?

## 2011-12-20 NOTE — Telephone Encounter (Signed)
Spoke with patient.  Informed of CXR results per Dr. Dalene Carrow.

## 2011-12-21 NOTE — Progress Notes (Signed)
CC:   Stan Head. Cleta Alberts, M.D. Graylin Shiver, M.D.  IDENTIFYING STATEMENT:  The patient is a 56 year old woman with rectal cancer who presents for followup.  INTERVAL HISTORY:  Ms. Luty was seen 9 months ago.  She has lost her job unfortunately but clinically she has no complaints.  She notes stable weight.  She has had no change in bowel function.  She denies abdominal pain.  MEDICATIONS:  Reviewed and updated.  ALLERGIES:  Amoxicillin.  PAST MEDICAL HISTORY/FAMILY HISTORY/SOCIAL HISTORY:  Unchanged.  REVIEW OF SYSTEMS:  Ten point review of systems negative.  PHYSICAL EXAM:  General:  The patient is a well-appearing, well- nourished woman in no distress.  Vitals:  Pulse 64, blood pressure 143/87, temperature 98.4, respirations 20, weight 113.2 pounds.  HEENT: Head is atraumatic, normocephalic.  Sclerae anicteric.  Mouth moist. Chest:  Scattered wheeze, otherwise clear.  Abdomen:  Soft, nontender. No masses.  Bowel sounds present.  Extremities:  No calf tenderness. CNS:  Nonfocal.  LAB DATA:  On 12/20/2011 white cell count 5.9, hemoglobin 14.5, hematocrit 41.6, platelets 248.  Sodium 137, potassium 4.8, chloride 100, CO2 25, BUN 19, creatinine 0.7, glucose 93, T bilirubin 0.75. Alkaline phosphatase 75, AST 22, ALT 19.  Chest x-ray on 12/20/2011 showed findings compatible with COPD but negative for metastatic no acute disease.  IMPRESSION AND PLAN:  Ms. 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  None of 20 lymph nodes sampled had evidence of tumor.  She completed external radiation therapy with CI 5- FU from 09/24/2007 through 10/20/2007.  She then received a laparoscopic assisted low anterior resection with mobilization of splenic flexure, and repeat proctoscopy on 12/22/2007.  She completed adjuvant therapy  in the form of [place] regimen from 02/10/2008 through 04/18/2008.  She also has history of right internal jugular deep vein thrombosis and was on Coumadin for a period of time.  Her current exam shows no evidence of recurrence.  She follows up in a year's time.  She does require colonoscopy and have asked her to follow up with Dr. Evette Cristal.    ______________________________ Laurice Record, M.D. LIO/MEDQ  D:  12/20/2011  T:  12/21/2011  Job:  [id number]

## 2011-12-31 ENCOUNTER — Ambulatory Visit: Payer: Self-pay | Admitting: Hematology and Oncology

## 2012-01-27 ENCOUNTER — Telehealth: Payer: Self-pay | Admitting: Hematology and Oncology

## 2012-01-27 NOTE — Telephone Encounter (Signed)
dr ganem/gi office called to let us know that they have tried to contact her x 3 and she has not responded.  They also lm w/ her son.

## 2012-03-07 ENCOUNTER — Encounter: Payer: Self-pay | Admitting: *Deleted

## 2012-03-07 ENCOUNTER — Telehealth: Payer: Self-pay | Admitting: *Deleted

## 2012-03-07 NOTE — Telephone Encounter (Signed)
Attempted to contact the patient per patient reassignment. I tired to call the home number, the message stated that the number is unable to accept phone calls at this time. I have canceled old appts and rescheduled to new doctor. Mailed letter and calendar.  JMW

## 2012-10-04 ENCOUNTER — Emergency Department (HOSPITAL_COMMUNITY): Payer: Self-pay

## 2012-10-04 ENCOUNTER — Emergency Department (HOSPITAL_COMMUNITY)
Admission: EM | Admit: 2012-10-04 | Discharge: 2012-10-04 | Disposition: A | Discharge status: Home / Self Care | Payer: Self-pay | Attending: Emergency Medicine | Admitting: Emergency Medicine

## 2012-10-04 ENCOUNTER — Encounter (HOSPITAL_COMMUNITY): Payer: Self-pay | Admitting: Emergency Medicine

## 2012-10-04 DIAGNOSIS — F411 Generalized anxiety disorder: Secondary | ICD-10-CM | POA: Insufficient documentation

## 2012-10-04 DIAGNOSIS — Z86718 Personal history of other venous thrombosis and embolism: Secondary | ICD-10-CM | POA: Insufficient documentation

## 2012-10-04 DIAGNOSIS — Y939 Activity, unspecified: Secondary | ICD-10-CM | POA: Insufficient documentation

## 2012-10-04 DIAGNOSIS — F1010 Alcohol abuse, uncomplicated: Secondary | ICD-10-CM | POA: Insufficient documentation

## 2012-10-04 DIAGNOSIS — Z85048 Personal history of other malignant neoplasm of rectum, rectosigmoid junction, and anus: Secondary | ICD-10-CM | POA: Insufficient documentation

## 2012-10-04 DIAGNOSIS — Z8669 Personal history of other diseases of the nervous system and sense organs: Secondary | ICD-10-CM | POA: Insufficient documentation

## 2012-10-04 DIAGNOSIS — Y92009 Unspecified place in unspecified non-institutional (private) residence as the place of occurrence of the external cause: Secondary | ICD-10-CM | POA: Insufficient documentation

## 2012-10-04 DIAGNOSIS — Z79899 Other long term (current) drug therapy: Secondary | ICD-10-CM | POA: Insufficient documentation

## 2012-10-04 DIAGNOSIS — F17200 Nicotine dependence, unspecified, uncomplicated: Secondary | ICD-10-CM | POA: Insufficient documentation

## 2012-10-04 DIAGNOSIS — S022XXA Fracture of nasal bones, initial encounter for closed fracture: Secondary | ICD-10-CM | POA: Insufficient documentation

## 2012-10-04 DIAGNOSIS — I10 Essential (primary) hypertension: Secondary | ICD-10-CM | POA: Insufficient documentation

## 2012-10-04 DIAGNOSIS — W19XXXA Unspecified fall, initial encounter: Secondary | ICD-10-CM

## 2012-10-04 DIAGNOSIS — Z8619 Personal history of other infectious and parasitic diseases: Secondary | ICD-10-CM | POA: Insufficient documentation

## 2012-10-04 DIAGNOSIS — S0181XA Laceration without foreign body of other part of head, initial encounter: Secondary | ICD-10-CM

## 2012-10-04 DIAGNOSIS — T07XXXA Unspecified multiple injuries, initial encounter: Secondary | ICD-10-CM | POA: Insufficient documentation

## 2012-10-04 DIAGNOSIS — W1789XA Other fall from one level to another, initial encounter: Secondary | ICD-10-CM | POA: Insufficient documentation

## 2012-10-04 DIAGNOSIS — S01501A Unspecified open wound of lip, initial encounter: Secondary | ICD-10-CM | POA: Insufficient documentation

## 2012-10-04 DIAGNOSIS — Z8509 Personal history of malignant neoplasm of other digestive organs: Secondary | ICD-10-CM | POA: Insufficient documentation

## 2012-10-04 DIAGNOSIS — S022XXB Fracture of nasal bones, initial encounter for open fracture: Secondary | ICD-10-CM

## 2012-10-04 LAB — POCT I-STAT, CHEM 8
Calcium, Ion: 1.08 mmol/L — ABNORMAL LOW (ref 1.12–1.23)
Creatinine, Ser: 0.9 mg/dL (ref 0.50–1.10)
Glucose, Bld: 90 mg/dL (ref 70–99)
HCT: 44 % (ref 36.0–46.0)
Hemoglobin: 15 g/dL (ref 12.0–15.0)
Potassium: 3.8 mEq/L (ref 3.5–5.1)
Sodium: 144 mEq/L (ref 135–145)

## 2012-10-04 LAB — CBC
HCT: 41 % (ref 36.0–46.0)
MCH: 34.6 pg — ABNORMAL HIGH (ref 26.0–34.0)
MCV: 100 fL (ref 78.0–100.0)
Platelets: 290 10*3/uL (ref 150–400)
RDW: 13.5 % (ref 11.5–15.5)
WBC: 7.7 10*3/uL (ref 4.0–10.5)

## 2012-10-04 MED ORDER — IBUPROFEN 600 MG PO TABS: 600.0000 mg | ORAL_TABLET | Freq: Four times a day (QID) | ORAL | Status: DC | PRN

## 2012-10-04 MED ORDER — SODIUM CHLORIDE 0.9 % IV SOLN
INTRAVENOUS | Status: DC
  Administered 2012-10-04: 02:00:00 via INTRAVENOUS

## 2012-10-04 MED ORDER — CLINDAMYCIN HCL 150 MG PO CAPS: 300.0000 mg | ORAL_CAPSULE | Freq: Three times a day (TID) | ORAL | Status: DC

## 2012-10-04 MED ORDER — TRAMADOL HCL 50 MG PO TABS: 50.0000 mg | ORAL_TABLET | Freq: Four times a day (QID) | ORAL | Status: DC | PRN

## 2012-10-04 MED ORDER — FENTANYL CITRATE 0.05 MG/ML IJ SOLN
50.0000 ug | INTRAMUSCULAR | Status: DC | PRN
  Administered 2012-10-04: 50 ug via INTRAVENOUS
  Filled 2012-10-04: qty 2

## 2012-10-04 MED ORDER — OXYCODONE-ACETAMINOPHEN 5-325 MG PO TABS
1.0000 | ORAL_TABLET | Freq: Once | ORAL | Status: AC
  Administered 2012-10-04: 1 via ORAL
  Filled 2012-10-04: qty 1

## 2012-10-04 MED ORDER — TETANUS-DIPHTH-ACELL PERTUSSIS 5-2.5-18.5 LF-MCG/0.5 IM SUSP
0.5000 mL | Freq: Once | INTRAMUSCULAR | Status: AC
  Administered 2012-10-04: 0.5 mL via INTRAMUSCULAR
  Filled 2012-10-04: qty 0.5

## 2012-10-04 MED ORDER — CYCLOBENZAPRINE HCL 10 MG PO TABS: 10.0000 mg | ORAL_TABLET | Freq: Two times a day (BID) | ORAL | Status: DC | PRN

## 2012-10-04 MED ORDER — LIDOCAINE-EPINEPHRINE 2 %-1:100000 IJ SOLN
INTRAMUSCULAR | Status: DC
Start: 2012-10-04 — End: 2012-10-04
  Filled 2012-10-04: qty 1

## 2012-10-04 MED ORDER — CLINDAMYCIN PHOSPHATE 600 MG/50ML IV SOLN
600.0000 mg | Freq: Once | INTRAVENOUS | Status: AC
  Administered 2012-10-04: 600 mg via INTRAVENOUS
  Filled 2012-10-04: qty 50

## 2012-10-04 MED ORDER — IOHEXOL 300 MG/ML  SOLN
100.0000 mL | Freq: Once | INTRAMUSCULAR | Status: AC | PRN
  Administered 2012-10-04: 100 mL via INTRAVENOUS

## 2012-10-04 MED ORDER — ONDANSETRON HCL 4 MG/2ML IJ SOLN
4.0000 mg | Freq: Once | INTRAMUSCULAR | Status: AC
  Administered 2012-10-04: 4 mg via INTRAVENOUS
  Filled 2012-10-04: qty 2

## 2012-10-04 NOTE — Consult Note (Signed)
Oral and Maxillofacial Surgery Consultation Note  Reason for Consult: Upper Lip Laceration   Referring Physician: Dr. Leonard Schwartz. Wendy [name] is an 57 y.o. female.  HPI: The patient reports that she was on her cell phone at home and walked out on her porch, but did not turn a light on.  She then fell off of the porch, which was 6 ft drop and onto bricks face first (lacerating her upper lip through-and-through).  Wendy [name] does admit to having alcohol on board at the time of the incident. She was taken to [facility] ED.  A CT scan at the hospital also is significant for bilateral nasal bone fractures.    PMHx:  Past Medical History  Diagnosis Date  . Rectal cancer   . Hypertension   . Hepatitis C   . Anxiety   . Herpes genitalia   . Hep C w/o coma, chronic     remote h/o IVD  . HSV-1 (herpes simplex virus 1) infection   . HSV-2 (herpes simplex virus 2) infection   . Migraine headache   . Allergy   . Adenocarcinoma of colon 07/2007  . History of DVT (deep vein thrombosis) 12/2007    R jugular vein  . Alcohol dependency     PSx:  Past Surgical History  Procedure Laterality Date  . Abdominal hysterectomy  1996    With bilateral oophorectomy.  Marland Kitchen Appendectomy  1996  . Tonsillectomy    . Tonsillectomy and adenoidectomy    . Bladder repair    . Colon surgery      Family Hx:  Family History  Problem Relation Age of Onset  . Colon cancer Mother   . Hypertension Mother   . Hyperparathyroidism Mother   . Colon cancer Father   . Cancer Father   . Cancer Other     Social Hx:  reports that she has been smoking Cigarettes.  She has been smoking about 1.50 packs per day. She does not have any smokeless tobacco history on file. She reports that she drinks about 12.0 ounces of alcohol per week. Her drug history is not on file.  Allergies:  Allergies  Allergen Reactions  . Amoxicillin Rash  . Vancomycin Itching    Erythema and mild itching -  Resolved with IV Benadryl.     Medications: I have reviewed the patient's current medications.  Labs:  Results for orders placed during the hospital encounter of 10/04/12 (from the past 48 hour(s))  CBC     Status: Abnormal   Collection Time    10/04/12 12:50 AM      Result Value Range   WBC 7.7  4.0 - 10.5 K/uL   RBC 4.10  3.87 - 5.11 MIL/uL   Hemoglobin 14.2  12.0 - 15.0 g/dL   HCT 13.2  44.0 - 10.2 %   MCV 100.0  78.0 - 100.0 fL   MCH 34.6 (*) 26.0 - 34.0 pg   MCHC 34.6  30.0 - 36.0 g/dL   RDW 72.5  36.6 - 44.0 %   Platelets 290  150 - 400 K/uL  ETHANOL     Status: Abnormal   Collection Time    10/04/12 12:50 AM      Result Value Range   Alcohol, Ethyl (B) 271 (*) 0 - 11 mg/dL   Comment:            LOWEST DETECTABLE LIMIT FOR     SERUM ALCOHOL IS 11 mg/dL  FOR MEDICAL PURPOSES ONLY  POCT I-STAT, CHEM 8     Status: Abnormal   Collection Time    10/04/12  1:51 AM      Result Value Range   Sodium 144  135 - 145 mEq/L   Potassium 3.8  3.5 - 5.1 mEq/L   Chloride 109  96 - 112 mEq/L   BUN 5 (*) 6 - 23 mg/dL   Creatinine, Ser 5.40  0.50 - 1.10 mg/dL   Glucose, Bld 90  70 - 99 mg/dL   Calcium, Ion 9.81 (*) 1.12 - 1.23 mmol/L   TCO2 23  0 - 100 mmol/L   Hemoglobin 15.0  12.0 - 15.0 g/dL   HCT 19.1  47.8 - 29.5 %    Radiology: Dg Chest 1 View  10/04/2012   CLINICAL DATA:  Laceration post fall.  EXAM: CHEST - 1 VIEW  COMPARISON:  12/20/2011  FINDINGS: The heart size and mediastinal contours are within normal limits. Both lungs are clear. The visualized skeletal structures are unremarkable. No pneumothorax.  IMPRESSION: No active disease.   Electronically Signed   By: Oley Balm M.D.   On: 10/04/2012 01:21    Maxillofacial CT Scan Comminuted displaced bilateral nasal bone and nasal septum  fractures.   AOZ:HYQMVHQIO items are noted in HPI.  Vital Signs: BP 168/84  Pulse 58  Temp(Src) 97.5 F (36.4 C) (Oral)  Resp 20  SpO2 96%  LMP 01/03/1995  Physical Exam: General appearance:  alert and cooperative Head: Normocephalic, without obvious abnormality Eyes: conjunctivae/corneas clear. PERRL, EOM's intact. Fundi benign., Bilateral infra-orbital ecchymosis Ears: normal TM's and external ear canals both ears Nose: deviated septum, moderate swelling, deviation of the nose 1 to 2 mm to the left Throat: abnormal findings: dentition: the patient is completely edentulous in the maxilla and partially edentulous in the mandible (dentures max and mand.), edema of lips and complex through and through 3 cm laceration involving the philtrum of the lip involving the vermillon border. and involving the maxillary anterior vestible.  The laceration also includes the orbicularis muscle.    Assessment/Plan: The patient is s/p fall at home from 6 feet with a resultant: -Complex through and through 3 cm upper lip Laceration -Bilateral Nasal Bone Fractures  1. Closure of the laceration in ER at the bedside  -Irrigation with copious saline  -15 mL of 2% Lidocaine with 1:100,000 epinephrine  -Closure with 3-0 chromic gut along labial mucosa, 4-0 vicryl muscle and deep tissue, and 5-0 prolene on skin 2. The Nasal bone will be re-assessed after swelling resolves on Thursday in my office.   3. I will remove sutures on Thursday in office call [phone] 4. I recommend  -Clindamycin 300 tid x 7 days,   -Bacitracin topical tid to wound  -Pain medication per ER  [place],Jacarra Bobak L  10/04/2012, 2:38 AM

## 2012-10-04 NOTE — ED Provider Notes (Signed)
CSN: [id number]     Arrival date & time 10/04/12  0024 History   First MD Initiated Contact with Patient 10/04/12 0138     Chief Complaint  Patient presents with  . Fall   (Consider location/radiation/quality/duration/timing/severity/associated sxs/prior Treatment) HPI History provided by patient. Admits to alcohol use tonight and fell off of her deck landing on her face and sustaining facial laceration. She denies LOC or neck pain. She does have some left wrist discomfort and abrasions over her left forearm. She denies any dental pain. No epistaxis. Bleeding controlled prior to arrival. Pain is sharp in quality and moderate in severity. No weakness or numbness. No abdominal pain or lacerations otherwise. Past Medical History  Diagnosis Date  . Rectal cancer   . Hypertension   . Hepatitis C   . Anxiety   . Herpes genitalia   . Hep C w/o coma, chronic     remote h/o IVD  . HSV-1 (herpes simplex virus 1) infection   . HSV-2 (herpes simplex virus 2) infection   . Migraine headache   . Allergy   . Adenocarcinoma of colon 07/2007  . History of DVT (deep vein thrombosis) 12/2007    R jugular vein  . Alcohol dependency    Past Surgical History  Procedure Laterality Date  . Abdominal hysterectomy  1996    With bilateral oophorectomy.  Marland Kitchen Appendectomy  1996  . Tonsillectomy    . Tonsillectomy and adenoidectomy    . Bladder repair    . Colon surgery     Family History  Problem Relation Age of Onset  . Colon cancer Mother   . Hypertension Mother   . Hyperparathyroidism Mother   . Colon cancer Father   . Cancer Father   . Cancer Other    History  Substance Use Topics  . Smoking status: Current Every Day Smoker -- 1.50 packs/day    Types: Cigarettes  . Smokeless tobacco: Not on file  . Alcohol Use: 12.0 oz/week    20 Shots of liquor per week   OB History   Grav Para Term Preterm Abortions TAB SAB Ect Mult Living                 Review of Systems  Constitutional:  Negative for fever and chills.  HENT: Negative for neck pain and neck stiffness.   Eyes: Negative for visual disturbance.  Respiratory: Negative for shortness of breath.   Cardiovascular: Negative for chest pain.  Gastrointestinal: Negative for vomiting and abdominal pain.  Genitourinary: Negative for flank pain.  Musculoskeletal: Negative for back pain.  Skin: Positive for wound. Negative for rash.  Neurological: Negative for weakness and headaches.  All other systems reviewed and are negative.    Allergies  Amoxicillin and Vancomycin  Home Medications   Current Outpatient Rx  Name  Route  Sig  Dispense  Refill  . acyclovir (ZOVIRAX) 200 MG capsule   Oral   Take 1 capsule (200 mg total) by mouth daily. Takes  1 - 2 tablets daily.   60 capsule   5   . ALPRAZolam (XANAX) 0.5 MG tablet   Oral   Take 0.5 mg by mouth at bedtime as needed for anxiety.         . cetirizine (ZYRTEC) 10 MG tablet   Oral   Take 10 mg by mouth as needed.         Marland Kitchen ibuprofen (ADVIL,MOTRIN) 200 MG tablet   Oral   Take 200 mg by  mouth as needed for pain.          Marland Kitchen lisinopril-hydrochlorothiazide (PRINZIDE,ZESTORETIC) 10-12.5 MG per tablet   Oral   Take 1 tablet by mouth daily.   90 tablet   1   . SUMAtriptan (IMITREX) 100 MG tablet   Oral   Take 1 tablet (100 mg total) by mouth every 2 (two) hours as needed.   10 tablet   5    BP 168/84  Pulse 58  Temp(Src) 97.5 F (36.4 C) (Oral)  Resp 20  SpO2 96%  LMP 01/03/1995 Physical Exam  Constitutional: She is oriented to person, place, and time. She appears well-developed and well-nourished.  HENT:  Head: Normocephalic.  Facial swelling especially over bridge of nose and  periorbital with some right periorbital ecchymosis. No entrapment with extraocular movements intact. No epistaxis. Extensive laceration right upper lip into right near full-thickness. No dental tenderness. No trismus. TMs clear. No scalp tenderness or lacerations.   Eyes: EOM are normal. Pupils are equal, round, and reactive to light.  Neck: Neck supple.  No C-spine tenderness or deformity. Cervical collar in place  Cardiovascular: Normal rate, regular rhythm and intact distal pulses.   Pulmonary/Chest: Effort normal and breath sounds normal. No respiratory distress. She exhibits no tenderness.  Abdominal: Soft. There is no tenderness.  Musculoskeletal: Normal range of motion. She exhibits no edema.  Mild tenderness over left wrist dorsal and lateral aspect without deformity. No tenderness over anatomical snuf box. Some abrasions to mid forearm without active bleeding. No tenderness over the elbow or hand. Distal neurovascular intact x4  Neurological: She is alert and oriented to person, place, and time.  Skin: Skin is warm and dry.    ED Course  Procedures (including critical care time) Labs Review Labs Reviewed  CBC - Abnormal; Notable for the following:    MCH 34.6 (*)    All other components within normal limits  ETHANOL - Abnormal; Notable for the following:    Alcohol, Ethyl (B) 271 (*)    All other components within normal limits  POCT I-STAT, CHEM 8 - Abnormal; Notable for the following:    BUN 5 (*)    Calcium, Ion 1.08 (*)    All other components within normal limits   Imaging Review Dg Chest 1 View  10/04/2012   CLINICAL DATA:  Laceration post fall.  EXAM: CHEST - 1 VIEW  COMPARISON:  12/20/2011  FINDINGS: The heart size and mediastinal contours are within normal limits. Both lungs are clear. The visualized skeletal structures are unremarkable. No pneumothorax.  IMPRESSION: No active disease.   Electronically Signed   By: Oley Balm M.D.   On: 10/04/2012 01:21   Dg Wrist Complete Left  10/04/2012   CLINICAL DATA:  Pain post fall  EXAM: LEFT WRIST - COMPLETE 3+ VIEW  COMPARISON:  None.  FINDINGS: There is no evidence of fracture or dislocation. There is no evidence of arthropathy or other focal bone abnormality. Soft tissues are  unremarkable. Carpal rows intact.  IMPRESSION: Negative.   Electronically Signed   By: Oley Balm M.D.   On: 10/04/2012 02:50   Ct Head Wo Contrast  10/04/2012   CLINICAL DATA:  Laceration post fall.  EXAM: CT HEAD WITHOUT CONTRAST  CT MAXILLOFACIAL WITHOUT CONTRAST  CT CERVICAL SPINE WITHOUT CONTRAST  TECHNIQUE: Multidetector CT imaging of the head, cervical spine, and maxillofacial structures were performed using the standard protocol without intravenous contrast. Multiplanar CT image reconstructions of the cervical spine and  maxillofacial structures were also generated.  COMPARISON:  None.  FINDINGS: CT HEAD FINDINGS  Fluid level in the right maxillary sinus. Patchy opacification of ethmoid air cells, right greater than left. Displaced bilateral nasal bone fractures. Displaced fracture of the nasal septum. There is no evidence of acute intracranial hemorrhage, brain edema, mass lesion, acute infarction, mass effect, or midline shift. Acute infarct may be in apparent on noncontrast CT. No other intra-axial abnormalities are seen, and the ventricles and sulci are within normal limits in size and symmetry. No abnormal extra-axial fluid collections or masses are identified. No significant calvarial abnormality.  CT MAXILLOFACIAL FINDINGS  Bilateral comminuted nasal bone fractures, displaced leftward by several mm. There is a comminuted fracture of the nasal septum. Fluid level in the right maxillary sinus. Patchy opacification of bilateral ethmoid air cells, right greater than left. And zygomatic arch is intact. Orbits and globes intact. Temporomandibular joints seated bilaterally. Mandible intact. Orbital floors intact. Multiple missing teeth and restorations.  CT CERVICAL SPINE FINDINGS  Mild reversal of the normal lordosis in the upper cervical spine. Negative for fracture. No prevertebral soft tissue swelling. Asymmetric facet degenerative hypertrophy C2-3, left greater than right. Advanced left facet  degenerative hypertrophy C3-4 which with small uncovertebral spurs result in left foraminal stenosis. There is 3 mm anterolisthesis of C3 on C4. Broad posterior bulge C4-5 with associated endplate osteophytes. Uncovertebral spurring results in foraminal stenosis bilaterally at C4-5 and C5-6, and on the right at C6-7. Marland Kitchen  IMPRESSION: CT HEAD IMPRESSION  Negative for bleed or other acute intracranial process.  CT MAXILLOFACIAL IMPRESSION  Comminuted displaced bilateral nasal bone and nasal septum fractures.  CT CERVICAL SPINE IMPRESSION  Negative for fracture.  Anterolisthesis C3-4 probably secondary to asymmetric facet disease. Flexion/extension radiographs would be required to exclude dynamic instability.  Multilevel degenerative changes as enumerated above.  Loss of the normal cervical spine lordosis, which may be secondary to positioning, spasm, or soft tissue injury.   Electronically Signed   By: Oley Balm M.D.   On: 10/04/2012 02:36   Ct Cervical Spine Wo Contrast  10/04/2012   CLINICAL DATA:  Laceration post fall.  EXAM: CT HEAD WITHOUT CONTRAST  CT MAXILLOFACIAL WITHOUT CONTRAST  CT CERVICAL SPINE WITHOUT CONTRAST  TECHNIQUE: Multidetector CT imaging of the head, cervical spine, and maxillofacial structures were performed using the standard protocol without intravenous contrast. Multiplanar CT image reconstructions of the cervical spine and maxillofacial structures were also generated.  COMPARISON:  None.  FINDINGS: CT HEAD FINDINGS  Fluid level in the right maxillary sinus. Patchy opacification of ethmoid air cells, right greater than left. Displaced bilateral nasal bone fractures. Displaced fracture of the nasal septum. There is no evidence of acute intracranial hemorrhage, brain edema, mass lesion, acute infarction, mass effect, or midline shift. Acute infarct may be in apparent on noncontrast CT. No other intra-axial abnormalities are seen, and the ventricles and sulci are within normal limits in  size and symmetry. No abnormal extra-axial fluid collections or masses are identified. No significant calvarial abnormality.  CT MAXILLOFACIAL FINDINGS  Bilateral comminuted nasal bone fractures, displaced leftward by several mm. There is a comminuted fracture of the nasal septum. Fluid level in the right maxillary sinus. Patchy opacification of bilateral ethmoid air cells, right greater than left. And zygomatic arch is intact. Orbits and globes intact. Temporomandibular joints seated bilaterally. Mandible intact. Orbital floors intact. Multiple missing teeth and restorations.  CT CERVICAL SPINE FINDINGS  Mild reversal of the normal lordosis in the upper  cervical spine. Negative for fracture. No prevertebral soft tissue swelling. Asymmetric facet degenerative hypertrophy C2-3, left greater than right. Advanced left facet degenerative hypertrophy C3-4 which with small uncovertebral spurs result in left foraminal stenosis. There is 3 mm anterolisthesis of C3 on C4. Broad posterior bulge C4-5 with associated endplate osteophytes. Uncovertebral spurring results in foraminal stenosis bilaterally at C4-5 and C5-6, and on the right at C6-7. Marland Kitchen  IMPRESSION: CT HEAD IMPRESSION  Negative for bleed or other acute intracranial process.  CT MAXILLOFACIAL IMPRESSION  Comminuted displaced bilateral nasal bone and nasal septum fractures.  CT CERVICAL SPINE IMPRESSION  Negative for fracture.  Anterolisthesis C3-4 probably secondary to asymmetric facet disease. Flexion/extension radiographs would be required to exclude dynamic instability.  Multilevel degenerative changes as enumerated above.  Loss of the normal cervical spine lordosis, which may be secondary to positioning, spasm, or soft tissue injury.   Electronically Signed   By: Oley Balm M.D.   On: 10/04/2012 02:36   Ct Abdomen Pelvis W Contrast  10/04/2012   CLINICAL DATA:  Laceration post fall. Hepatitis-C. History of rectal carcinoma.  EXAM: CT ABDOMEN AND PELVIS  WITH CONTRAST  TECHNIQUE: Multidetector CT imaging of the abdomen and pelvis was performed using the standard protocol following bolus administration of intravenous contrast.  CONTRAST:  OMNIPAQUE IOHEXOL 300 MG/ML  SOLN  COMPARISON:  01/31/2011  FINDINGS: Visualized lung bases clear. Unremarkable liver, gallbladder, spleen, adrenal glands, pancreas, left kidney. 3.2 cm cyst at lower pole right kidney, stable. No hydronephrosis. Patchy aortoiliac arterial plaque. Stomach, small bowel, and colon are nondilated. Staple line and rectosigmoid junction. Urinary bladder physiologically distended. No ascites. Previous hysterectomy. Left pelvic phlebolith stable. No free air. No adenopathy. Portal vein patent. No hydronephrosis. Visualized bones unremarkable.  IMPRESSION: 1. No acute abdominal abnormality.   Electronically Signed   By: Oley Balm M.D.   On: 10/04/2012 02:49   Ct Maxillofacial Wo Cm  10/04/2012   CLINICAL DATA:  Laceration post fall.  EXAM: CT HEAD WITHOUT CONTRAST  CT MAXILLOFACIAL WITHOUT CONTRAST  CT CERVICAL SPINE WITHOUT CONTRAST  TECHNIQUE: Multidetector CT imaging of the head, cervical spine, and maxillofacial structures were performed using the standard protocol without intravenous contrast. Multiplanar CT image reconstructions of the cervical spine and maxillofacial structures were also generated.  COMPARISON:  None.  FINDINGS: CT HEAD FINDINGS  Fluid level in the right maxillary sinus. Patchy opacification of ethmoid air cells, right greater than left. Displaced bilateral nasal bone fractures. Displaced fracture of the nasal septum. There is no evidence of acute intracranial hemorrhage, brain edema, mass lesion, acute infarction, mass effect, or midline shift. Acute infarct may be in apparent on noncontrast CT. No other intra-axial abnormalities are seen, and the ventricles and sulci are within normal limits in size and symmetry. No abnormal extra-axial fluid collections or masses are  identified. No significant calvarial abnormality.  CT MAXILLOFACIAL FINDINGS  Bilateral comminuted nasal bone fractures, displaced leftward by several mm. There is a comminuted fracture of the nasal septum. Fluid level in the right maxillary sinus. Patchy opacification of bilateral ethmoid air cells, right greater than left. And zygomatic arch is intact. Orbits and globes intact. Temporomandibular joints seated bilaterally. Mandible intact. Orbital floors intact. Multiple missing teeth and restorations.  CT CERVICAL SPINE FINDINGS  Mild reversal of the normal lordosis in the upper cervical spine. Negative for fracture. No prevertebral soft tissue swelling. Asymmetric facet degenerative hypertrophy C2-3, left greater than right. Advanced left facet degenerative hypertrophy C3-4 which with small  uncovertebral spurs result in left foraminal stenosis. There is 3 mm anterolisthesis of C3 on C4. Broad posterior bulge C4-5 with associated endplate osteophytes. Uncovertebral spurring results in foraminal stenosis bilaterally at C4-5 and C5-6, and on the right at C6-7. Marland Kitchen  IMPRESSION: CT HEAD IMPRESSION  Negative for bleed or other acute intracranial process.  CT MAXILLOFACIAL IMPRESSION  Comminuted displaced bilateral nasal bone and nasal septum fractures.  CT CERVICAL SPINE IMPRESSION  Negative for fracture.  Anterolisthesis C3-4 probably secondary to asymmetric facet disease. Flexion/extension radiographs would be required to exclude dynamic instability.  Multilevel degenerative changes as enumerated above.  Loss of the normal cervical spine lordosis, which may be secondary to positioning, spasm, or soft tissue injury.   Electronically Signed   By: Oley Balm M.D.   On: 10/04/2012 02:36   3:10 AM Dr. Jeanice Lim bedside repairing facial laceration, recs f/u Thur SR, Clindamycin, topical bacitracin and pain medications all provided with return precautions - discharged home with family.   MDM  Diagnosis: Fall, facial  laceration, nasal fractures, facial contusions CT scans and imaging reviewed per RAD Pain medications provided Wound care IV ABx Wound repair by Dr Jeanice Lim VS and nurses notes reviewed   Sunnie Nielsen, MD 10/04/12 [phone]

## 2012-10-04 NOTE — ED Notes (Addendum)
Pt states she was on the phone and walked outside and did not turn on porch light walked off end of 5x5 porch which was 64ft drop face first and hit lip on bricks. Lip is split open from upper lip to nose. Pt alert and oriented pt is unsure if she hit her head.C-collar place upon arrival.

## 2012-10-09 ENCOUNTER — Encounter (HOSPITAL_COMMUNITY): Payer: Self-pay | Admitting: *Deleted

## 2012-10-09 ENCOUNTER — Encounter (HOSPITAL_COMMUNITY): Payer: Self-pay | Admitting: Pharmacy Technician

## 2012-10-09 NOTE — Progress Notes (Signed)
Pt denies SOB, chest pain, and being under the care of a cardiologist. Pt stated that she had a stress test over 5 years ago but denies having an echo and cardiac cath. Pt made aware to stop Stop taking Aspirin and herbal medications. Do not take any NSAIDs ie: Ibuprofen, Advil, Naproxen or any medication containing Aspirin.

## 2012-10-11 ENCOUNTER — Other Ambulatory Visit: Payer: Self-pay | Admitting: Physician Assistant

## 2012-10-12 ENCOUNTER — Encounter (HOSPITAL_COMMUNITY): Payer: Self-pay | Admitting: Anesthesiology

## 2012-10-12 ENCOUNTER — Encounter (HOSPITAL_COMMUNITY): Payer: Self-pay | Admitting: *Deleted

## 2012-10-12 ENCOUNTER — Ambulatory Visit (HOSPITAL_COMMUNITY)
Admission: RE | Admit: 2012-10-12 | Discharge: 2012-10-12 | Disposition: A | Discharge status: Home / Self Care | Payer: Self-pay | Source: Ambulatory Visit | Attending: Oral and Maxillofacial Surgery | Admitting: Oral and Maxillofacial Surgery

## 2012-10-12 ENCOUNTER — Ambulatory Visit (HOSPITAL_COMMUNITY): Payer: Self-pay | Admitting: Anesthesiology

## 2012-10-12 ENCOUNTER — Encounter (HOSPITAL_COMMUNITY)
Admission: RE | Disposition: A | Discharge status: Home / Self Care | Payer: Self-pay | Source: Ambulatory Visit | Attending: Oral and Maxillofacial Surgery

## 2012-10-12 DIAGNOSIS — F17200 Nicotine dependence, unspecified, uncomplicated: Secondary | ICD-10-CM | POA: Insufficient documentation

## 2012-10-12 DIAGNOSIS — S022XXA Fracture of nasal bones, initial encounter for closed fracture: Secondary | ICD-10-CM | POA: Insufficient documentation

## 2012-10-12 DIAGNOSIS — I10 Essential (primary) hypertension: Secondary | ICD-10-CM | POA: Insufficient documentation

## 2012-10-12 DIAGNOSIS — W1789XA Other fall from one level to another, initial encounter: Secondary | ICD-10-CM | POA: Insufficient documentation

## 2012-10-12 DIAGNOSIS — F411 Generalized anxiety disorder: Secondary | ICD-10-CM | POA: Insufficient documentation

## 2012-10-12 DIAGNOSIS — S022XXD Fracture of nasal bones, subsequent encounter for fracture with routine healing: Secondary | ICD-10-CM

## 2012-10-12 HISTORY — PX: CLOSED REDUCTION NASAL FRACTURE: SHX5365

## 2012-10-12 SURGERY — CLOSED REDUCTION, FRACTURE, NASAL BONE
Anesthesia: General | Wound class: Clean

## 2012-10-12 MED ORDER — LACTATED RINGERS IV SOLN
INTRAVENOUS | Status: DC | PRN
  Administered 2012-10-12: 18:00:00 via INTRAVENOUS

## 2012-10-12 MED ORDER — OXYCODONE HCL 5 MG PO TABS: 5.0000 mg | ORAL_TABLET | Freq: Once | ORAL | Status: DC | PRN

## 2012-10-12 MED ORDER — OXYCODONE HCL 5 MG/5ML PO SOLN: 5.0000 mg | Freq: Once | ORAL | Status: DC | PRN

## 2012-10-12 MED ORDER — OXYMETAZOLINE HCL 0.05 % NA SOLN
NASAL | Status: AC
  Filled 2012-10-12: qty 15

## 2012-10-12 MED ORDER — OXYMETAZOLINE HCL 0.05 % NA SOLN
NASAL | Status: DC | PRN
  Administered 2012-10-12: 1 via NASAL

## 2012-10-12 MED ORDER — ONDANSETRON HCL 4 MG/2ML IJ SOLN
INTRAMUSCULAR | Status: DC | PRN
  Administered 2012-10-12: 4 mg via INTRAVENOUS

## 2012-10-12 MED ORDER — FENTANYL CITRATE 0.05 MG/ML IJ SOLN
INTRAMUSCULAR | Status: DC | PRN
  Administered 2012-10-12 (×3): 50 ug via INTRAVENOUS

## 2012-10-12 MED ORDER — PROPOFOL 10 MG/ML IV BOLUS
INTRAVENOUS | Status: DC | PRN
  Administered 2012-10-12: 200 mg via INTRAVENOUS

## 2012-10-12 MED ORDER — ONDANSETRON HCL 4 MG/2ML IJ SOLN: 4.0000 mg | Freq: Four times a day (QID) | INTRAMUSCULAR | Status: DC | PRN

## 2012-10-12 MED ORDER — LIDOCAINE HCL (CARDIAC) 20 MG/ML IV SOLN
INTRAVENOUS | Status: DC | PRN
  Administered 2012-10-12: 50 mg via INTRAVENOUS

## 2012-10-12 MED ORDER — BACITRACIN ZINC 500 UNIT/GM EX OINT
TOPICAL_OINTMENT | CUTANEOUS | Status: DC | PRN
  Administered 2012-10-12: 1 via TOPICAL

## 2012-10-12 MED ORDER — MIDAZOLAM HCL 5 MG/5ML IJ SOLN
INTRAMUSCULAR | Status: DC | PRN
  Administered 2012-10-12: 2 mg via INTRAVENOUS

## 2012-10-12 MED ORDER — HYDROMORPHONE HCL PF 1 MG/ML IJ SOLN
INTRAMUSCULAR | Status: AC
  Filled 2012-10-12: qty 1

## 2012-10-12 MED ORDER — DEXAMETHASONE SODIUM PHOSPHATE 4 MG/ML IJ SOLN
INTRAMUSCULAR | Status: DC | PRN
  Administered 2012-10-12: 8 mg via INTRAVENOUS

## 2012-10-12 MED ORDER — HYDROMORPHONE HCL PF 1 MG/ML IJ SOLN
0.2500 mg | INTRAMUSCULAR | Status: DC | PRN
  Administered 2012-10-12 (×2): 0.5 mg via INTRAVENOUS

## 2012-10-12 MED ORDER — LIDOCAINE-EPINEPHRINE 1 %-1:100000 IJ SOLN
INTRAMUSCULAR | Status: DC | PRN
  Administered 2012-10-12: 4 mL

## 2012-10-12 MED ORDER — DOUBLE ANTIBIOTIC 500-10000 UNIT/GM EX OINT
TOPICAL_OINTMENT | CUTANEOUS | Status: AC
  Filled 2012-10-12: qty 1

## 2012-10-12 MED ORDER — LACTATED RINGERS IV SOLN
INTRAVENOUS | Status: DC
  Administered 2012-10-12: 16:00:00 via INTRAVENOUS

## 2012-10-12 MED ORDER — LIDOCAINE-EPINEPHRINE 1 %-1:100000 IJ SOLN
INTRAMUSCULAR | Status: AC
  Filled 2012-10-12: qty 1

## 2012-10-12 SURGICAL SUPPLY — 28 items
APL SKNCLS STERI-STRIP NONHPOA (GAUZE/BANDAGES/DRESSINGS) ×1
BENZOIN TINCTURE PRP APPL 2/3 (GAUZE/BANDAGES/DRESSINGS) ×2 IMPLANT
CANISTER SUCTION 2500CC (MISCELLANEOUS) ×2 IMPLANT
CLOTH BEACON ORANGE TIMEOUT ST (SAFETY) ×2 IMPLANT
COVER TABLE BACK 60X90 (DRAPES) ×1 IMPLANT
GAUZE PACKING IODOFORM 1/4X5 (PACKING) ×1 IMPLANT
GAUZE SPONGE 2X2 8PLY STRL LF (GAUZE/BANDAGES/DRESSINGS) IMPLANT
GAUZE SPONGE 4X4 16PLY XRAY LF (GAUZE/BANDAGES/DRESSINGS) ×1 IMPLANT
GLOVE BIO SURGEON STRL SZ 6.5 (GLOVE) ×1 IMPLANT
GLOVE BIO SURGEON STRL SZ7.5 (GLOVE) ×2 IMPLANT
GOWN PREVENTION PLUS XLARGE (GOWN DISPOSABLE) ×1 IMPLANT
GOWN STRL NON-REIN LRG LVL3 (GOWN DISPOSABLE) ×2 IMPLANT
KIT BASIN OR (CUSTOM PROCEDURE TRAY) ×2 IMPLANT
KIT ROOM TURNOVER OR (KITS) ×2 IMPLANT
KIT SPLINT NASAL DENVER LRG BE (GAUZE/BANDAGES/DRESSINGS) IMPLANT
KIT SPLINT NASAL DENVER PET BE (GAUZE/BANDAGES/DRESSINGS) IMPLANT
KIT SPLINT NASAL DENVER SM BEI (GAUZE/BANDAGES/DRESSINGS) IMPLANT
NDL HYPO 25X1 1.5 SAFETY (NEEDLE) ×1 IMPLANT
NEEDLE HYPO 25X1 1.5 SAFETY (NEEDLE) ×2 IMPLANT
PACK EENT II TURBAN DRAPE (CUSTOM PROCEDURE TRAY) ×1 IMPLANT
PAD ARMBOARD 7.5X6 YLW CONV (MISCELLANEOUS) ×4 IMPLANT
SPLINT NASAL DOYLE BI-VL (GAUZE/BANDAGES/DRESSINGS) ×1 IMPLANT
SPONGE GAUZE 2X2 STER 10/PKG (GAUZE/BANDAGES/DRESSINGS) ×1
SPONGE NEURO XRAY DETECT 1X3 (DISPOSABLE) ×2 IMPLANT
SUT SILK 2 0 FS (SUTURE) ×1 IMPLANT
SYR CONTROL 10ML LL (SYRINGE) ×2 IMPLANT
TOWEL OR 17X24 6PK STRL BLUE (TOWEL DISPOSABLE) ×6 IMPLANT
TUBE CONNECTING 12X1/4 (SUCTIONS) ×2 IMPLANT

## 2012-10-12 NOTE — H&P (Signed)
Wendy [name] is an 57 y.o. female.   Chief Complaint: Nasal Fracture HPI: On 10/04/10, the patient was taken to [facility] ER for lacerations to the upper lip and bilateral nasal bone fracture (noted on head CT).   She sustained these injuries after she fell off of the porch at home, which was 6 ft drop and onto bricks face first. [name] does admit to having alcohol on board at the time of the incident and was using her cell phone. I closed her upper lip laceration in the ER . She was seen for follow up in my office the following week and it was noted that she still had a cosmetic deformity and decrease breathing through her nose after the swelling had resolved.   PMHx:  Past Medical History  Diagnosis Date  . Rectal cancer   . Hypertension   . Hepatitis C   . Anxiety   . Herpes genitalia   . Hep C w/o coma, chronic     remote h/o IVD  . HSV-1 (herpes simplex virus 1) infection   . HSV-2 (herpes simplex virus 2) infection   . Migraine headache   . Allergy   . Adenocarcinoma of colon 07/2007  . History of DVT (deep vein thrombosis) 12/2007    R jugular vein  . Alcohol dependency     PSx:  Past Surgical History  Procedure Laterality Date  . Abdominal hysterectomy  1996    With bilateral oophorectomy.  Marland Kitchen Appendectomy  1996  . Tonsillectomy    . Tonsillectomy and adenoidectomy    . Bladder repair    . Colon surgery    . Colonoscopy w/ biopsies and polypectomy      Hx; of    Family Hx:  Family History  Problem Relation Age of Onset  . Colon cancer Mother   . Hypertension Mother   . Hyperparathyroidism Mother   . Colon cancer Father   . Cancer Father   . Cancer Other     Social History:  reports that she has been smoking Cigarettes.  She has been smoking about 1.50 packs per day. She has never used smokeless tobacco. She reports that she drinks about 12.0 ounces of alcohol per week. She reports that she does not use illicit drugs.  Allergies:  Allergies  Allergen  Reactions  . Amoxicillin Rash  . Vancomycin Itching    Erythema and mild itching -  Resolved with IV Benadryl.    Meds:  No prescriptions prior to admission    Labs: No results found for this or any previous visit (from the past 48 hour(s)).  Radiology: No results found.  ROS: Pertinent items are noted in HPI.  Vitals: Ht 5\' 2"  (1.575 m)  Wt 52.164 kg (115 lb)  BMI 21.03 kg/m2  LMP 01/03/1995  Physical Exam: General appearance: alert and cooperative Head: Normocephalic, without obvious abnormality Eyes: conjunctivae/corneas clear. PERRL, EOM's intact. Fundi benign. Ears: normal TM's and external ear canals both ears Nose: Crepitus present at the bridge of nose and deviated to the left. Septum deviated as well, but no hematoma. Throat: lips, mucosa, and tongue normal; teeth and gums normal Cardio: regular rate and rhythm, S1, S2 normal, no murmur, click, rub or gallop GI: soft, non-tender; bowel sounds normal; no masses,  no organomegaly Extremities: extremities normal, atraumatic, no cyanosis or edema Pulses: 2+ and symmetric Skin: Skin color, texture, turgor normal. No rashes or lesions Periorbital ecchymosis present.  Her suture line in the upper lip  is intact and there is no signs of infection.    Assessment/Plan [name] has a bilateral nasal bone fracture.  She will be taken to the OR for outpatient Closed Reduction of the Nasal Bone Fractures with Splinting.  Remer,Rickell Wiehe L  10/12/2012, 2:01 PM

## 2012-10-12 NOTE — Transfer of Care (Signed)
Immediate Anesthesia Transfer of Care Note  Patient: Wendy [name]  Procedure(s) Performed: Procedure(s): CLOSED REDUCTION NASAL BONE FRACTURE WITH STABILZATION  (N/A)  Patient Location: PACU  Anesthesia Type:General  Level of Consciousness: awake, patient cooperative and responds to stimulation  Airway & Oxygen Therapy: Patient Spontanous Breathing and Patient connected to face mask oxygen  Post-op Assessment: Report given to PACU RN and Post -op Vital signs reviewed and stable  Post vital signs: Reviewed and stable  Complications: No apparent anesthesia complications

## 2012-10-12 NOTE — Brief Op Note (Signed)
10/12/2012  6:30 PM  PATIENT:  Wendy [name]  57 y.o. female  PRE-OPERATIVE DIAGNOSIS:  NASAL BONE FRACTURE   POST-OPERATIVE DIAGNOSIS:  Same    PROCEDURE:  Procedure(s): CLOSED REDUCTION NASAL BONE FRACTURE WITH STABILZATION  (N/A)  SURGEON:  Surgeon(s) and Role:    * Francene Finders, DDS - Primary  PHYSICIAN ASSISTANT: None  ASSISTANTS: none   ANESTHESIA:   general  EBL:  Total I/O In: 800 [I.V.:800] Out: -   BLOOD ADMINISTERED:none  DRAINS: none   LOCAL MEDICATIONS USED:  2% LIDOCAINE with 1:100,000 epinephrine and Amount: 4 ml  SPECIMEN:  No Specimen  DISPOSITION OF SPECIMEN:  N/A  COUNTS:  YES  TOURNIQUET:  * No tourniquets in log *  DICTATION: .Note written in EPIC  PLAN OF CARE: Discharge to home after PACU  PATIENT DISPOSITION:  PACU - hemodynamically stable.   Delay start of Pharmacological VTE agent (>24hrs) due to surgical blood loss or risk of bleeding: not applicable

## 2012-10-12 NOTE — Progress Notes (Signed)
Ok to use labs drawn in ER on 10/04/2012 per anesthesia

## 2012-10-12 NOTE — Op Note (Signed)
10/12/2012  6:32 PM  PATIENT:  Wendy [name]  57 y.o. female  PRE-OPERATIVE DIAGNOSIS:  BILATERAL NASAL BONE FRACTURE   POST-OPERATIVE DIAGNOSIS:  BILATERAL NASAL BONE FRACTURE  INDICATION FOR SURGERY: The patient is a 57 year-old female that walked off of her deck at home while on her cell phone after having drinks.  She fell approximately 6 feet onto her face.  She was diagnosed with bilateral nasal bone fractures.  We allowed her swelling to resolve and then re-evaluated her nasal asymetry and difficulty breathing and we decided to take the patient to the operating room at [facility] for closed reduction. She also sustained an upper lip laceration, but this was repaired in the Emergency Department at [facility].     PROCEDURE:  Procedure(s): CLOSED REDUCTION NASAL BONE FRACTURE WITH STABILZATION   SURGEON:  Surgeon(s): Francene Finders, DDS  PHYSICIAN ASSISTANT: NONE  ASSISTANTS: none   ANESTHESIA:   general  PROCEDURE IN DETAIL: The patient was seen in the preoperative area and her consent was signed and verified.  The history and physical was updated.  The patient was taken to the OR by anesthesia and placed on the table in a supine position.  Anesthesia placed an LMA and turned the patient over to me.  The patient was draped, but not prepped.    Four neuro-paddies were placed into both nares (2 per side).  Next 1% Lidocaine with 1:100,000 epinephrine was used to anesthetize the nose.  Next, nasal bone elevators were used to reduce the nasal bone fractures which were positioned to the left into a midline position.  A Doyle splint covered with bacitracin was placed into each nare.  The Doyle splint was secured with 2-0 silk suture.  Then a thermoplastic splint was placed onto the external nose.  There was good hemostasis and the counts were correct at the conclusion of the case.    EBL:  Total I/O In: 800 [I.V.:800] Out: -   DRAINS: none   LOCAL MEDICATIONS  USED:  1% LIDOCAINE with 1:100,000 epinephrine and Amount: 4 ml  SPECIMEN:  No Specimen  DISPOSITION OF SPECIMEN:  N/A  COUNTS:  YES  PLAN OF CARE: Discharge to home after PACU  PATIENT DISPOSITION:  PACU - hemodynamically stable.   Delay start of Pharmacological VTE agent (>24hrs) due to surgical blood loss or risk of bleeding:  not applicable

## 2012-10-12 NOTE — Anesthesia Postprocedure Evaluation (Signed)
  Anesthesia Post-op Note  Patient: Wendy [name]  Procedure(s) Performed: Procedure(s): CLOSED REDUCTION NASAL BONE FRACTURE WITH STABILZATION  (N/A)  Patient Location: PACU  Anesthesia Type:General  Level of Consciousness: awake and alert   Airway and Oxygen Therapy: Patient Spontanous Breathing  Post-op Pain: mild  Post-op Assessment: Post-op Vital signs reviewed, Patient's Cardiovascular Status Stable, Respiratory Function Stable, Patent Airway and No signs of Nausea or vomiting  Post-op Vital Signs: Reviewed and stable  Complications: No apparent anesthesia complications

## 2012-10-12 NOTE — Preoperative (Signed)
Beta Blockers   Reason not to administer Beta Blockers:Not Applicable 

## 2012-10-12 NOTE — Anesthesia Preprocedure Evaluation (Signed)
Anesthesia Evaluation  Patient identified by MRN, date of birth, ID band Patient awake    Reviewed: Allergy & Precautions, H&P , NPO status , Patient's Chart, lab work & pertinent test results  Airway Mallampati: II  Neck ROM: full    Dental   Pulmonary Current Smoker,          Cardiovascular hypertension, DVT     Neuro/Psych  Headaches, Anxiety    GI/Hepatic (+)     substance abuse  alcohol use, Hepatitis -, C  Endo/Other    Renal/GU      Musculoskeletal   Abdominal   Peds  Hematology   Anesthesia Other Findings   Reproductive/Obstetrics                           Anesthesia Physical Anesthesia Plan  ASA: III  Anesthesia Plan: General   Post-op Pain Management:    Induction: Intravenous  Airway Management Planned: Oral ETT  Additional Equipment:   Intra-op Plan:   Post-operative Plan: Extubation in OR  Informed Consent: I have reviewed the patients History and Physical, chart, labs and discussed the procedure including the risks, benefits and alternatives for the proposed anesthesia with the patient or authorized representative who has indicated his/her understanding and acceptance.     Plan Discussed with: CRNA, Anesthesiologist and Surgeon  Anesthesia Plan Comments:         Anesthesia Quick Evaluation

## 2012-10-12 NOTE — Interval H&P Note (Signed)
History and Physical Interval Note:  10/12/2012 5:44 PM  Wendy [name]  has presented today for surgery, with the diagnosis of NASAL BONE FRACTURE   The various methods of treatment have been discussed with the patient and family. After consideration of risks, benefits and other options for treatment, the patient has consented to  Procedure(s): CLOSED REDUCTION NASAL BONE FRACTURE WITH STABILZATION  (N/A) as a surgical intervention .  The patient's history has been reviewed, patient examined, no change in status, stable for surgery.  I have reviewed the patient's chart and labs.  Questions were answered to the patient's satisfaction.     Nazareth,Jaye Polidori L

## 2012-10-13 ENCOUNTER — Encounter (HOSPITAL_COMMUNITY): Payer: Self-pay | Admitting: Oral and Maxillofacial Surgery

## 2012-12-15 ENCOUNTER — Other Ambulatory Visit: Payer: Self-pay

## 2012-12-15 ENCOUNTER — Other Ambulatory Visit: Payer: Self-pay | Admitting: Oncology

## 2012-12-15 DIAGNOSIS — C7A026 Malignant carcinoid tumor of the rectum: Secondary | ICD-10-CM

## 2012-12-16 ENCOUNTER — Ambulatory Visit: Payer: Self-pay | Admitting: Oncology

## 2012-12-16 ENCOUNTER — Other Ambulatory Visit: Payer: Self-pay | Admitting: Lab

## 2012-12-17 ENCOUNTER — Ambulatory Visit: Payer: Self-pay | Admitting: Hematology and Oncology

## 2014-05-23 ENCOUNTER — Ambulatory Visit: Payer: Worker's Compensation

## 2014-05-23 ENCOUNTER — Ambulatory Visit (INDEPENDENT_AMBULATORY_CARE_PROVIDER_SITE_OTHER): Payer: Worker's Compensation | Admitting: Emergency Medicine

## 2014-05-23 VITALS — BP 124/82 | HR 71 | Temp 98.4°F | Resp 18 | Ht 63.0 in | Wt 104.0 lb

## 2014-05-23 DIAGNOSIS — M654 Radial styloid tenosynovitis [de Quervain]: Secondary | ICD-10-CM

## 2014-05-23 DIAGNOSIS — M25532 Pain in left wrist: Secondary | ICD-10-CM | POA: Diagnosis not present

## 2014-05-23 MED ORDER — NAPROXEN SODIUM 550 MG PO TABS: 550.0000 mg | ORAL_TABLET | Freq: Two times a day (BID) | ORAL | Status: AC

## 2014-05-23 MED ORDER — ACYCLOVIR 200 MG PO CAPS: ORAL_CAPSULE | ORAL | Status: DC

## 2014-05-23 NOTE — Progress Notes (Signed)
[facility] [address],  [place] [address] [phone]  Date:  05/23/2014   Name:  Wendy [name]   DOB:  [birth year]   MRN:  [id number]  PCP:  No primary care provider on file.    Chief Complaint: Wrist Injury and Medication Refill   History of Present Illness:  Wendy [name] is a 59 y.o. very pleasant female patient who presents with the following:  Developed pain in left radial wrist after slipping at work. Felt a pop in her wrist and has increasing pain since. Pain worse with use of hand No improvement with over the counter medications or other home remedies. Denies other complaint or health concern today.   Patient Active Problem List   Diagnosis Date Noted  . Closed fracture nasal bone 10/12/2012  . Rectal cancer 12/20/2011  . Hep C w/o coma, chronic   . HSV-1 (herpes simplex virus 1) infection   . Migraine headache   . History of DVT (deep vein thrombosis)   . 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 Rectal cancer   . Hypertension   . Hepatitis C   . Anxiety   . Herpes genitalia   . Hep C w/o coma, chronic     remote h/o IVD  . HSV-1 (herpes simplex virus 1) infection   . HSV-2 (herpes simplex virus 2) infection   . Migraine headache   . Allergy   . Adenocarcinoma of colon 07/2007  . History of DVT (deep vein thrombosis) 12/2007    R jugular vein  . Alcohol dependency     Past Surgical History  Procedure Laterality Date  . Abdominal hysterectomy  1996    With bilateral oophorectomy.  Marland Kitchen Appendectomy  1996  . Tonsillectomy    . Tonsillectomy and adenoidectomy    . Bladder repair    . Colon surgery    . Colonoscopy w/ biopsies and polypectomy      Hx; of  . Closed reduction nasal fracture N/A 10/12/2012    Procedure: CLOSED REDUCTION NASAL BONE FRACTURE WITH STABILZATION ;  Surgeon: Isac Caddy, DDS;  Location: [facility];  Service: Oral Surgery;  Laterality: N/A;     History  Substance Use Topics  . Smoking status: Current Every Day Smoker -- 1.50 packs/day    Types: Cigarettes  . Smokeless tobacco: Never Used  . Alcohol Use: 12.0 oz/week    20 Shots of liquor per week    Family History  Problem Relation Age of Onset  . Colon cancer Mother   . Hypertension Mother   . Hyperparathyroidism Mother   . Colon cancer Father   . Cancer Father   . Cancer Other     Allergies  Allergen Reactions  . Amoxicillin Rash  . Vancomycin Itching    Erythema and mild itching -  Resolved with IV Benadryl.    Medication list has been reviewed and updated.  Current Outpatient Prescriptions on File Prior to Visit  Medication Sig Dispense Refill  . acyclovir (ZOVIRAX) 200 MG capsule TAKE ONE TO TWO CAPSULES BY MOUTH DAILY. 60 capsule 5  . cetirizine (ZYRTEC) 10 MG tablet Take 10 mg by mouth daily as needed for allergies.     . clindamycin (CLEOCIN) 150 MG capsule Take 2 capsules (300 mg total) by mouth 3 (three) times daily. (Patient not taking: Reported on 05/23/2014) 21 capsule 0  . cyclobenzaprine (FLEXERIL) 10 MG  tablet Take 1 tablet (10 mg total) by mouth 2 (two) times daily as needed for muscle spasms. (Patient not taking: Reported on 05/23/2014) 20 tablet 0  . ibuprofen (ADVIL,MOTRIN) 600 MG tablet Take 1 tablet (600 mg total) by mouth every 6 (six) hours as needed for pain. (Patient not taking: Reported on 05/23/2014) 30 tablet 0  . lisinopril-hydrochlorothiazide (PRINZIDE,ZESTORETIC) 10-12.5 MG per tablet Take 1 tablet by mouth daily. (Patient not taking: Reported on 05/23/2014) 90 tablet 1  . traMADol (ULTRAM) 50 MG tablet Take 1 tablet (50 mg total) by mouth every 6 (six) hours as needed for pain. (Patient not taking: Reported on 05/23/2014) 15 tablet 0   No current facility-administered medications on file prior to visit.    Review of Systems:  Review of Systems  Constitutional: Negative for fever, chills and fatigue.  HENT: Negative for congestion,  ear pain, hearing loss, postnasal drip, rhinorrhea and sinus pressure.   Eyes: Negative for discharge and redness.  Respiratory: Negative for cough, shortness of breath and wheezing.   Cardiovascular: Negative for chest pain and leg swelling.  Gastrointestinal: Negative for nausea, vomiting, abdominal pain, constipation and blood in stool.  Genitourinary: Negative for dysuria, urgency and frequency.  Musculoskeletal: Negative for neck stiffness.  Skin: Negative for rash.  Neurological: Negative for seizures, weakness and headaches.   Physical Examination: Filed Vitals:   05/23/14 1421  BP: 124/82  Pulse: 71  Temp: 98.4 F (36.9 C)  Resp: 18   Filed Vitals:   05/23/14 1421  Height: 5\' 3"  (1.6 m)  Weight: 104 lb (47.174 kg)   Body mass index is 18.43 kg/(m^2). Ideal Body Weight: Weight in (lb) to have BMI = 25: 140.8  GEN: WDWN, NAD, Non-toxic, A & O x 3 HEENT: Atraumatic, Normocephalic. Neck supple. No masses, No LAD. Ears and Nose: No external deformity. CV: RRR, No M/G/R. No JVD. No thrill. No extra heart sounds. PULM: CTA B, no wheezes, crackles, rhonchi. No retractions. No resp. distress. No accessory muscle use. ABD: S, NT, ND, +BS. No rebound. No HSM. EXTR: No c/c/e NEURO Normal gait.  PSYCH: Normally interactive. Conversant. Not depressed or anxious appearing.  Calm demeanor.  LEFT wrist:  Tender radial aspect.  Provocative test of [place] positive  Assessment and Plan: 1. Wrist pain, acute, left Anaprox  - DG Wrist Complete Left  2. De Quervain's tenosynovitis, left Anaprox Thumb spica splint Follow up in one week   Signed Ellison Carwin, MD  [facility] reading (PRIMARY) by  Dr. Ouida Sills.  No osseous injury.

## 2014-05-23 NOTE — Patient Instructions (Signed)
De Quervain's Tenosynovitis De Quervain's tenosynovitis involves inflammation of one or two tendon linings (sheaths) or strain of one or two tendons to the thumb: extensor pollicis brevis (EPB), or abductor pollicis longus (APL). This causes pain on the side of the wrist and base of the thumb. Tendon sheaths secrete a fluid that lubricates the tendon, allowing the tendon to move smoothly. When the sheath becomes inflamed, the tendon cannot move freely in the sheath. Both the EPB and APL tendons are important for proper use of the hand. The EPB tendon is important for straightening the thumb. The APL tendon is important for moving the thumb away from the index finger (abducting). The two tendons pass through a small tube (canal) in the wrist, near the base of the thumb. When the tendons become inflamed, pain is usually felt in this area. SYMPTOMS   Pain, tenderness, swelling, warmth, or redness over the base of the thumb and thumb side of the wrist.  Pain that gets worse when straightening the thumb.  Pain that gets worse when moving the thumb away from the index finger, against resistance.  Pain with pinching or gripping.  Locking or catching of the thumb.  Limited motion of the thumb.  Crackling sound (crepitation) when the tendon or thumb is moved or touched.  Fluid-filled cyst in the area of the base of the thumb. CAUSES   Tenosynovitis is often linked with overuse of the wrist.  Tenosynovitis may be caused by repeated injury to the thumb muscle and tendon units, and with repeated motions of the hand and wrist, due to friction of the tendon within the lining (sheath).  Tenosynovitis may also be due to a sudden increase in activity or change in activity. RISK INCREASES WITH:  Sports that involve repeated hand and wrist motions (golf, bowling, tennis, squash, racquetball).  Heavy labor.  Poor physical wrist strength and flexibility.  Failure to warm up properly before practice or  play.  Female gender.  New mothers who hold their baby's head for long periods or lift infants with thumbs in the infant's armpit (axilla). PREVENTION  Warm up and stretch properly before practice or competition.  Allow enough time for rest and recovery between practices and competition.  Maintain appropriate conditioning:  Cardiovascular fitness.  Forearm, wrist, and hand flexibility.  Muscle strength and endurance.  Use proper exercise technique. PROGNOSIS  This condition is usually curable within 6 weeks, if treated properly with non-surgical treatment and resting of the affected area.  RELATED COMPLICATIONS   Longer healing time if not properly treated or if not given enough time to heal.  Chronic inflammation, causing recurring symptoms of tenosynovitis. Permanent pain or restriction of movement.  Risks of surgery: infection, bleeding, injury to nerves (numbness of the thumb), continued pain, incomplete release of the tendon sheath, recurring symptoms, cutting of the tendons, tendons sliding out of position, weakness of the thumb, thumb stiffness. TREATMENT  First, treatment involves the use of medicine and ice, to reduce pain and inflammation. Patients are encouraged to stop or modify activities that aggravate the injury. Stretching and strengthening exercises may be advised. Exercises may be completed at home or with a therapist. You may be fitted with a brace or splint, to limit motion and allow the injury to heal. Your caregiver may also choose to give you a corticosteroid injection, to reduce the pain and inflammation. If non-surgical treatment is not successful, surgery may be needed. Most tenosynovitis surgeries are done as outpatient procedures (you go home the   same day). Surgery may involve local, regional (whole arm), or general anesthesia.  MEDICATION   If pain medicine is needed, nonsteroidal anti-inflammatory medicines (aspirin and ibuprofen), or other minor pain  relievers (acetaminophen), are often advised.  Do not take pain medicine for 7 days before surgery.  Prescription pain relievers are often prescribed only after surgery. Use only as directed and only as much as you need.  Corticosteroid injections may be given if your caregiver thinks they are needed. There is a limited number of times these injections may be given. COLD THERAPY   Cold treatment (icing) should be applied for 10 to 15 minutes every 2 to 3 hours for inflammation and pain, and immediately after activity that aggravates your symptoms. Use ice packs or an ice massage. SEEK MEDICAL CARE IF:   Symptoms get worse or do not improve in 2 to 4 weeks, despite treatment.  You experience pain, numbness, or coldness in the hand.  Blue, gray, or dark color appears in the fingernails.  Any of the following occur after surgery: increased pain, swelling, redness, drainage of fluids, bleeding in the affected area, or signs of infection.  New, unexplained symptoms develop. (Drugs used in treatment may produce side effects.) Document Released: 12/31/2004 Document Revised: 03/25/2011 Document Reviewed: 04/14/2008 ExitCare Patient Information 2015 [employer]. This information is not intended to replace advice given to you by your health care provider. Make sure you discuss any questions you have with your health care provider.   

## 2014-05-31 ENCOUNTER — Ambulatory Visit: Payer: Self-pay | Admitting: Physician Assistant

## 2014-06-01 ENCOUNTER — Ambulatory Visit (INDEPENDENT_AMBULATORY_CARE_PROVIDER_SITE_OTHER): Payer: Worker's Compensation | Admitting: Emergency Medicine

## 2014-06-01 VITALS — BP 148/80 | HR 64 | Temp 98.0°F | Resp 16 | Ht 63.0 in | Wt 101.0 lb

## 2014-06-01 DIAGNOSIS — M25532 Pain in left wrist: Secondary | ICD-10-CM

## 2014-06-01 DIAGNOSIS — M654 Radial styloid tenosynovitis [de Quervain]: Secondary | ICD-10-CM

## 2014-06-01 NOTE — Patient Instructions (Signed)
De Quervain's Disease De Quervain's disease is a condition often seen in racquet sports where there is a soreness (inflammation) in the cord like structures (tendons) which attach muscle to bone on the thumb side of the wrist. There may be a tightening of the tissuesaround the tendons. This condition is often helped by giving up or modifying the activity which caused it. When conservative treatment does not help, surgery may be required. Conservative treatment could include changes in the activity which brought about the problem or made it worse. Anti-inflammatory medications and injections may be used to help decrease the inflammation and help with pain control. Your caregiver will help you determine which is best for you. DIAGNOSIS  Often the diagnosis (learning what is wrong) can be made by examination. Sometimes x-rays are required. HOME CARE INSTRUCTIONS   Apply ice to the sore area for 15-20 minutes, 03-04 times per day while awake. Put the ice in a plastic bag and place a towel between the bag of ice and your skin. This is especially helpful if it can be done after all activities involving the sore wrist.  Temporary splinting may help.  Only take over-the-counter or prescription medicines for pain, discomfort or fever as directed by your caregiver. SEEK MEDICAL CARE IF:   Pain relief is not obtained with medications, or if you have increasing pain and seem to be getting worse rather than better. MAKE SURE YOU:   Understand these instructions.  Will watch your condition.  Will get help right away if you are not doing well or get worse. Document Released: 09/25/2000 Document Revised: 03/25/2011 Document Reviewed: 05/05/2013 ExitCare Patient Information 2015 [employer]. This information is not intended to replace advice given to you by your health care provider. Make sure you discuss any questions you have with your health care provider.  

## 2014-06-01 NOTE — Progress Notes (Signed)
Subjective:  Patient ID: Wendy [name], female    DOB: 03-19-1955  Age: 59 y.o. MRN: [id number]  CC: Follow-up   HPI VALERYA MAXTON presents for follow-up on workman's comp injury. She was treated last week for de Quervain's tenosynovitis with nonsteroidal anti-inflammatory and thumb spica splint. She's had modest improvement of her pain as well as improved movement. She says her swelling feels down. She has no redness or ecchymosis at the wrist  No improvement with over the counter medications or other home remedies. Denies other complaint or health concern today.   Outpatient Prescriptions Prior to Visit  Medication Sig Dispense Refill  . acyclovir (ZOVIRAX) 200 MG capsule TAKE ONE TO TWO CAPSULES BY MOUTH DAILY. 60 capsule 5  . cetirizine (ZYRTEC) 10 MG tablet Take 10 mg by mouth daily as needed for allergies.     Marland Kitchen ibuprofen (ADVIL,MOTRIN) 600 MG tablet Take 1 tablet (600 mg total) by mouth every 6 (six) hours as needed for pain. 30 tablet 0  . lisinopril-hydrochlorothiazide (PRINZIDE,ZESTORETIC) 10-12.5 MG per tablet Take 1 tablet by mouth daily. (Patient not taking: Reported on 05/23/2014) 90 tablet 1  . naproxen sodium (ANAPROX DS) 550 MG tablet Take 1 tablet (550 mg total) by mouth 2 (two) times daily with a meal. (Patient not taking: Reported on 06/01/2014) 40 tablet 0  . clindamycin (CLEOCIN) 150 MG capsule Take 2 capsules (300 mg total) by mouth 3 (three) times daily. (Patient not taking: Reported on 05/23/2014) 21 capsule 0  . cyclobenzaprine (FLEXERIL) 10 MG tablet Take 1 tablet (10 mg total) by mouth 2 (two) times daily as needed for muscle spasms. (Patient not taking: Reported on 05/23/2014) 20 tablet 0  . traMADol (ULTRAM) 50 MG tablet Take 1 tablet (50 mg total) by mouth every 6 (six) hours as needed for pain. (Patient not taking: Reported on 05/23/2014) 15 tablet 0   No facility-administered medications prior to visit.    ROS Review of Systems  Constitutional: Negative for  fever, chills and appetite change.  HENT: Negative for congestion, ear pain, postnasal drip, sinus pressure and sore throat.   Eyes: Negative for pain and redness.  Respiratory: Negative for cough, shortness of breath and wheezing.   Cardiovascular: Negative for leg swelling.  Gastrointestinal: Negative for nausea, vomiting, abdominal pain, diarrhea, constipation and blood in stool.  Endocrine: Negative for polyuria.  Genitourinary: Negative for dysuria, urgency, frequency and flank pain.  Musculoskeletal: Negative for gait problem.  Skin: Negative for rash.  Neurological: Negative for weakness and headaches.  Psychiatric/Behavioral: Negative for confusion and decreased concentration. The patient is not nervous/anxious.     Objective:  BP 148/80 mmHg  Pulse 64  Temp(Src) 98 F (36.7 C) (Oral)  Resp 16  Ht 5\' 3"  (1.6 m)  Wt 101 lb (45.813 kg)  BMI 17.90 kg/m2  SpO2 94%  LMP 01/03/1995  BP Readings from Last 3 Encounters:  06/01/14 148/80  05/23/14 124/82  10/12/12 187/83    Wt Readings from Last 3 Encounters:  06/01/14 101 lb (45.813 kg)  05/23/14 104 lb (47.174 kg)  10/12/12 105 lb (47.628 kg)    Physical Exam  Constitutional: She is oriented to person, place, and time. She appears well-developed and well-nourished.  HENT:  Head: Normocephalic and atraumatic.  Eyes: Conjunctivae are normal. Pupils are equal, round, and reactive to light.  Pulmonary/Chest: Effort normal.  Musculoskeletal: She exhibits no edema.  Neurological: She is alert and oriented to person, place, and time.  Skin: Skin is  dry.  Psychiatric: She has a normal mood and affect. Her behavior is normal. Thought content normal.   left wrist: Provocative test Wynn Maudlin was positive. She has some minimal pain but is largely resolved.  Lab Results  Component Value Date   WBC 7.7 10/04/2012   HGB 15.0 10/04/2012   HCT 44.0 10/04/2012   PLT 290 10/04/2012   GLUCOSE 90 10/04/2012   ALT 19 12/20/2011     AST 22 12/20/2011   NA 144 10/04/2012   K 3.8 10/04/2012   CL 109 10/04/2012   CREATININE 0.90 10/04/2012   BUN 5* 10/04/2012   CO2 25 12/20/2011   TSH 0.641 03/06/2011   INR 1.00* 07/20/2009      Assessment & Plan:   Wendy [name] was seen today for follow-up.  Diagnoses and all orders for this visit:  De Quervain's tenosynovitis, left   I have discontinued Ms. Karam's traMADol, clindamycin, and cyclobenzaprine. I am also having her maintain her lisinopril-hydrochlorothiazide, cetirizine, ibuprofen, naproxen sodium, and acyclovir.  No orders of the defined types were placed in this encounter.     Follow-up: Return in about 1 week (around 06/08/2014).  Roselee Culver, MD

## 2014-06-08 ENCOUNTER — Ambulatory Visit (INDEPENDENT_AMBULATORY_CARE_PROVIDER_SITE_OTHER): Payer: Worker's Compensation | Admitting: Family Medicine

## 2014-06-08 VITALS — BP 120/78 | HR 85 | Temp 98.3°F | Resp 18 | Ht 63.0 in | Wt 101.0 lb

## 2014-06-08 DIAGNOSIS — M659 Synovitis and tenosynovitis, unspecified: Secondary | ICD-10-CM

## 2014-06-08 DIAGNOSIS — M25532 Pain in left wrist: Secondary | ICD-10-CM | POA: Diagnosis not present

## 2014-06-08 NOTE — Patient Instructions (Signed)
Continue current light-duty  Take the naproxen 550 mg twice daily on a faithful basis  Return in 2 weeks for recheck

## 2014-06-08 NOTE — Progress Notes (Signed)
  Subjective:  Patient ID: Wendy [name], female    DOB: [birth year]  Age: 59 y.o. MRN: [id number]  Patient is here for a follow-up with regard to her work-related DeQuervain's tenosynovitis of the left wrist. She feels like it is gradually improving. 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 She has been faithfully keeping the splint on except for when she took a shower. It is starting to get some odor to it. She has continued taking the ibuprofen and has not started taking the naproxen. She had some difficulty getting it filled.   Objective:   Pleasant lady healthy in appearance. She still has a little visible swelling of the left wrist and hand at the base of the thumb and second finger. Range of motion is good. Flexion and extension of the thumb were not terribly painful, but flexion of the wrist does cause pain. Neurovascular intact.  Assessment & Plan:   Assessment: DeQuervain tenosynovitis  Plan: Patient Instructions  Continue current light-duty  Take the naproxen 550 mg twice daily on a faithful basis  Return in 2 weeks for recheck     Chrishun Scheer, MD 06/08/2014

## 2014-06-23 ENCOUNTER — Ambulatory Visit (INDEPENDENT_AMBULATORY_CARE_PROVIDER_SITE_OTHER): Payer: Worker's Compensation | Admitting: Emergency Medicine

## 2014-06-23 VITALS — BP 142/66 | HR 68 | Temp 99.1°F | Resp 17 | Ht 62.5 in | Wt 104.2 lb

## 2014-06-23 DIAGNOSIS — M654 Radial styloid tenosynovitis [de Quervain]: Secondary | ICD-10-CM

## 2014-06-23 NOTE — Progress Notes (Signed)
Subjective:  Patient ID: Wendy [name], female    DOB: 04/02/1955  Age: 59 y.o. MRN: [id number]  CC: Follow-up   HPI Wendy [name] presents  for [employer]. follow-up of wrist pain. She was previously diagnosed with de Quervain's tenosynovitis and has been using a thumb spica splint. She is taking a non-steroidal and inflammatory. She now has no pain. She's been working her normal job at work. Requests a work release.  Outpatient Prescriptions Prior to Visit  Medication Sig Dispense Refill  . acyclovir (ZOVIRAX) 200 MG capsule TAKE ONE TO TWO CAPSULES BY MOUTH DAILY. 60 capsule 5  . ibuprofen (ADVIL,MOTRIN) 600 MG tablet Take 1 tablet (600 mg total) by mouth every 6 (six) hours as needed for pain. 30 tablet 0  . cetirizine (ZYRTEC) 10 MG tablet Take 10 mg by mouth daily as needed for allergies.     Marland Kitchen lisinopril-hydrochlorothiazide (PRINZIDE,ZESTORETIC) 10-12.5 MG per tablet Take 1 tablet by mouth daily. (Patient not taking: Reported on 05/23/2014) 90 tablet 1  . naproxen sodium (ANAPROX DS) 550 MG tablet Take 1 tablet (550 mg total) by mouth 2 (two) times daily with a meal. (Patient not taking: Reported on 06/01/2014) 40 tablet 0   No facility-administered medications prior to visit.    History   Social History  . Marital Status: Single    Spouse Name: N/A  . Number of Children: N/A  . Years of Education: N/A   Social History Main Topics  . Smoking status: Current Every Day Smoker -- 1.50 packs/day    Types: Cigarettes  . Smokeless tobacco: Never Used  . Alcohol Use: 12.0 oz/week    20 Shots of liquor per week  . Drug Use: No  . Sexual Activity: Not on file   Other Topics Concern  . None   Social History Narrative    Family History  Problem Relation Age of Onset  . Colon cancer Mother   . Hypertension Mother   . Hyperparathyroidism Mother   . Colon cancer Father   . Cancer Father   . Cancer Other     Past Medical History  Diagnosis Date  . Rectal cancer     . Hypertension   . Hepatitis C   . Anxiety   . Herpes genitalia   . Hep C w/o coma, chronic     remote h/o IVD  . HSV-1 (herpes simplex virus 1) infection   . HSV-2 (herpes simplex virus 2) infection   . Migraine headache   . Allergy   . Adenocarcinoma of colon 07/2007  . History of DVT (deep vein thrombosis) 12/2007    R jugular vein  . Alcohol dependency      Review of Systems  Constitutional: Negative for fever, chills and appetite change.  HENT: Negative for congestion, ear pain, postnasal drip, sinus pressure and sore throat.   Eyes: Negative for pain and redness.  Respiratory: Negative for cough, shortness of breath and wheezing.   Cardiovascular: Negative for leg swelling.  Gastrointestinal: Negative for nausea, vomiting, abdominal pain, diarrhea, constipation and blood in stool.  Endocrine: Negative for polyuria.  Genitourinary: Negative for dysuria, urgency, frequency and flank pain.  Musculoskeletal: Negative for gait problem.  Skin: Negative for rash.  Neurological: Negative for weakness and headaches.  Psychiatric/Behavioral: Negative for confusion and decreased concentration. The patient is not nervous/anxious.     Objective:  BP 142/66 mmHg  Pulse 68  Temp(Src) 99.1 F (37.3 C) (Oral)  Resp 17  Ht 5'  2.5" (1.588 m)  Wt 104 lb 3.2 oz (47.265 kg)  BMI 18.74 kg/m2  SpO2 98%  LMP 01/03/1995  BP Readings from Last 3 Encounters:  06/23/14 142/66  06/08/14 120/78  06/01/14 148/80    Wt Readings from Last 3 Encounters:  06/23/14 104 lb 3.2 oz (47.265 kg)  06/08/14 101 lb (45.813 kg)  06/01/14 101 lb (45.813 kg)    Physical Exam  Constitutional: She is oriented to person, place, and time. She appears well-developed and well-nourished.  HENT:  Head: Normocephalic and atraumatic.  Eyes: Conjunctivae are normal. Pupils are equal, round, and reactive to light.  Pulmonary/Chest: Effort normal.  Musculoskeletal: She exhibits no edema.       Left wrist:  She exhibits normal range of motion, no tenderness and no swelling.  Neurological: She is alert and oriented to person, place, and time.  Skin: Skin is dry.  Psychiatric: She has a normal mood and affect. Her behavior is normal. Thought content normal.    Lab Results  Component Value Date   WBC 7.7 10/04/2012   HGB 15.0 10/04/2012   HCT 44.0 10/04/2012   PLT 290 10/04/2012   GLUCOSE 90 10/04/2012   ALT 19 12/20/2011   AST 22 12/20/2011   NA 144 10/04/2012   K 3.8 10/04/2012   CL 109 10/04/2012   CREATININE 0.90 10/04/2012   BUN 5* 10/04/2012   CO2 25 12/20/2011   TSH 0.641 03/06/2011   INR 1.00* 07/20/2009      .  Assessment & Plan:   There are no diagnoses linked to this encounter. I am having Wendy [name] maintain her lisinopril-hydrochlorothiazide, cetirizine, ibuprofen, naproxen sodium, and acyclovir.  No orders of the defined types were placed in this encounter.    Appropriate red flag conditions were discussed with the patient as well as actions that should be taken.  Patient expressed his understanding.  Follow-up: No Follow-up on file.  Roselee Culver, MD

## 2016-09-28 ENCOUNTER — Encounter (HOSPITAL_COMMUNITY): Payer: Self-pay | Admitting: Emergency Medicine

## 2016-09-28 ENCOUNTER — Ambulatory Visit (HOSPITAL_COMMUNITY)
Admission: EM | Admit: 2016-09-28 | Discharge: 2016-09-28 | Disposition: A | Discharge status: Home / Self Care | Payer: Self-pay | Attending: Internal Medicine | Admitting: Internal Medicine

## 2016-09-28 DIAGNOSIS — R197 Diarrhea, unspecified: Secondary | ICD-10-CM

## 2016-09-28 DIAGNOSIS — Z760 Encounter for issue of repeat prescription: Secondary | ICD-10-CM

## 2016-09-28 MED ORDER — DIPHENOXYLATE-ATROPINE 2.5-0.025 MG PO TABS: 2.0000 | ORAL_TABLET | Freq: Four times a day (QID) | ORAL | 0 refills | Status: DC | PRN

## 2016-09-28 MED ORDER — ACYCLOVIR 200 MG PO CAPS: ORAL_CAPSULE | ORAL | 0 refills | Status: DC

## 2016-09-28 NOTE — Discharge Instructions (Signed)
Take Lomotil as directed for diarrhea. Keep hydrated, you urine should be clear to pale yellow in color. Bland diet as attached, advance as tolerated. Probiotics after diarrhea resolves. Monitor for any worsening of symptoms, nausea or vomiting not controlled by medication, worsening abdominal pain, fever, follow-up for reevaluation.  Please establish PCP care for further evaluation and treatment needed of history of rectal cancer and hypertension. If you experience weakness, dizziness, chest pain, shortness of breath, headache/blurry  vision, follow up at the emergency department for further evaluation.

## 2016-09-28 NOTE — ED Provider Notes (Signed)
[facility]    CSN: [id number] Arrival date & time: 09/28/16  1211     History   Chief Complaint Chief Complaint  Patient presents with  . Diarrhea    HPI Wendy [name] is a 61 y.o. female.   61 year old female with history of rectal cancer, alcohol dependency, HSV, comes in for a 3 day history of diarrhea. She states she has these episodes intermittently, and is associated with food. She states she had a burger with cheese, which she thinks is what started symptoms. States she is having loose to watery bowel movements every few hours. Denies blood in stool. Has some associated nausea, but no vomiting. She continues to keep fluids down without a problem. Denies fever, chills, night sweats. States she has generalized abdominal pain during bowel movements, otherwise no abdominal pain at baseline. Patient is also follow up with oncologist due to loss of insurance. At that time, she was told yearly follow-ups with oncologist and is in need of colonoscopy. Denies sick contacts, recent travels, antibiotic use.      Past Medical History:  Diagnosis Date  . Adenocarcinoma of colon ([place]) 07/2007  . Alcohol dependency ([place])   . Allergy   . Anxiety   . Hep C w/o coma, chronic ([facility])    remote h/o IVD  . Hepatitis C   . Herpes genitalia   . History of DVT (deep vein thrombosis) 12/2007   R jugular vein  . HSV-1 (herpes simplex virus 1) infection   . HSV-2 (herpes simplex virus 2) infection   . Hypertension   . Migraine headache   . Rectal cancer [facility])     Patient Active Problem List   Diagnosis Date Noted  . Closed fracture nasal bone 10/12/2012  . Rectal cancer ([place]) 12/20/2011  . Hep C w/o coma, chronic ([place])   . HSV-1 (herpes simplex virus 1) infection   . Migraine headache   . History of DVT (deep vein thrombosis)   . 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    Past Surgical History:  Procedure Laterality Date  . ABDOMINAL HYSTERECTOMY  1996   With bilateral oophorectomy.  . APPENDECTOMY  1996  . BLADDER REPAIR    . CLOSED REDUCTION NASAL FRACTURE N/A 10/12/2012   Procedure: CLOSED REDUCTION NASAL BONE FRACTURE WITH STABILZATION ;  Surgeon: Isac Caddy, DDS;  Location: [facility];  Service: Oral Surgery;  Laterality: N/A;  . COLON SURGERY    . COLONOSCOPY W/ BIOPSIES AND POLYPECTOMY     Hx; of  . TONSILLECTOMY    . TONSILLECTOMY AND ADENOIDECTOMY      OB History    No data available       Home Medications    Prior to Admission medications   Medication Sig Start Date End Date Taking? Authorizing Provider  acyclovir (ZOVIRAX) 200 MG capsule TAKE ONE TO TWO CAPSULES BY MOUTH DAILY. 09/28/16   Tasia Catchings, Tiwanna Tuch V, PA-C  cetirizine (ZYRTEC) 10 MG tablet Take 10 mg by mouth daily as needed for allergies.     [provider]  diphenoxylate-atropine (LOMOTIL) 2.5-0.025 MG tablet Take 2 tablets by mouth 4 (four) times daily as needed for diarrhea or loose stools. 09/28/16   Tasia Catchings, Tailer Volkert V, PA-C  ibuprofen (ADVIL,MOTRIN) 600 MG tablet Take 1 tablet (600 mg total) by mouth every 6 (six) hours as needed for pain. 10/04/12   Teressa Lower, MD  lisinopril-hydrochlorothiazide (PRINZIDE,ZESTORETIC) 10-12.5 MG per tablet Take 1 tablet by mouth daily. Patient  not taking: Reported on 05/23/2014 03/06/11   Argentina Donovan, PA-C    Family History Family History  Problem Relation Age of Onset  . Colon cancer Mother   . Hypertension Mother   . Hyperparathyroidism Mother   . Colon cancer Father   . Cancer Father   . Cancer Other     Social History Social History  Substance Use Topics  . Smoking status: Current Every Day Smoker    Packs/day: 1.50    Types: Cigarettes  . Smokeless tobacco: Never Used  . Alcohol use 12.0 oz/week    20 Shots of liquor per week     Allergies   Amoxicillin and Vancomycin   Review of Systems Review of Systems  Reason unable to perform ROS: See HPI as above.     Physical Exam Triage Vital Signs ED  Triage Vitals [09/28/16 1248]  Enc Vitals Group     BP (!) 184/86     Pulse Rate 68     Resp      Temp 99 F (37.2 C)     Temp Source Oral     SpO2 100 %     Weight      Height      Head Circumference      Peak Flow      Pain Score      Pain Loc      Pain Edu?      Excl. in [place]?    No data found.   Updated Vital Signs BP (!) 187/74 (BP Location: Left Arm)   Pulse 62   Temp 99 F (37.2 C) (Oral)   LMP 01/03/1995   SpO2 100%   Physical Exam  Constitutional: She is oriented to person, place, and time. She appears well-developed and well-nourished. No distress.  HENT:  Head: Normocephalic and atraumatic.  Eyes: Pupils are equal, round, and reactive to light. Conjunctivae are normal.  Neck: Normal range of motion. Neck supple.  Cardiovascular: Normal rate, regular rhythm and normal heart sounds.  Exam reveals no gallop and no friction rub.   No murmur heard. Pulmonary/Chest: Effort normal and breath sounds normal. She has no wheezes. She has no rales.  Abdominal: Soft. Bowel sounds are normal. She exhibits no mass. There is tenderness (Generalized tenderness, max at LLQ). There is no rebound, no guarding and no CVA tenderness.  Lymphadenopathy:    She has no cervical adenopathy.  Neurological: She is alert and oriented to person, place, and time. She has normal strength. She is not disoriented. No cranial nerve deficit or sensory deficit. She displays a negative Romberg sign.  Skin: Skin is warm and dry.  Psychiatric: She has a normal mood and affect. Her behavior is normal. Judgment normal.     UC Treatments / Results  Labs (all labs ordered are listed, but only abnormal results are displayed) Labs Reviewed - No data to display  EKG  EKG Interpretation None       Radiology No results found.  Procedures Procedures (including critical care time)  Medications Ordered in UC Medications - No data to display   Initial Impression / Assessment and Plan / UC  Course  I have reviewed the triage vital signs and the nursing notes.  Pertinent labs & imaging results that were available during my care of the patient were reviewed by me and considered in my medical decision making (see chart for details).  Clinical Course as of Sep 28 1400  Sat Sep 28, 2016  1347 Discussed blood pressure results with patient, patient with history of hypertension not on antihypertensives due to loss of insurance. Denies chest pain, shortness of breath, weakness, dizziness, headache, blurry vision.  [AY]    Clinical Course User Index [AY] Ok Edwards, PA-C   Start Lomotil for diarrhea. Push fluids. Bland diet, advance as tolerated. Discussed with patient given history of rectal cancer, will need follow-up with PCP for further evaluation and treatment. Discussed vitals with patient, who denies chest pain, shortness of breath, weakness, dizziness, headache, blurry vision. Patient to follow up with PCP for further management of hypertension. Resources given. Return precautions given.  Case discussed with Dr Valere Dross, who agrees to plan.   Final Clinical Impressions(s) / UC Diagnoses   Final diagnoses:  Diarrhea, unspecified type  Medication refill    New Prescriptions New Prescriptions   DIPHENOXYLATE-ATROPINE (LOMOTIL) 2.5-0.025 MG TABLET    Take 2 tablets by mouth 4 (four) times daily as needed for diarrhea or loose stools.     Controlled Substance Prescriptions [place] Controlled Substance Registry consulted? Yes, I have consulted the [place] Controlled Substances Registry for this patient, and feel the risk/benefit ratio today is favorable for proceeding with this prescription for a controlled substance.   Ok Edwards, PA-C 09/28/16 1402

## 2016-09-28 NOTE — ED Triage Notes (Signed)
Pt reports diarrhea x3 days.  Imodium is not working.

## 2019-03-03 ENCOUNTER — Other Ambulatory Visit: Payer: Self-pay | Admitting: Physician Assistant

## 2019-03-03 DIAGNOSIS — B1920 Unspecified viral hepatitis C without hepatic coma: Secondary | ICD-10-CM

## 2019-03-18 ENCOUNTER — Ambulatory Visit
Admission: RE | Admit: 2019-03-18 | Discharge: 2019-03-18 | Disposition: A | Discharge status: Home / Self Care | Payer: 59 | Source: Ambulatory Visit | Attending: Physician Assistant | Admitting: Physician Assistant

## 2019-03-18 DIAGNOSIS — B1920 Unspecified viral hepatitis C without hepatic coma: Secondary | ICD-10-CM

## 2023-11-14 ENCOUNTER — Inpatient Hospital Stay (HOSPITAL_COMMUNITY)
Admission: EM | Admit: 2023-11-14 | Discharge: 2023-11-17 | DRG: 291 | Disposition: A | Discharge status: Home Health | Attending: Internal Medicine | Admitting: Internal Medicine

## 2023-11-14 ENCOUNTER — Other Ambulatory Visit: Payer: Self-pay

## 2023-11-14 ENCOUNTER — Encounter (HOSPITAL_COMMUNITY): Payer: Self-pay | Admitting: Internal Medicine

## 2023-11-14 ENCOUNTER — Inpatient Hospital Stay (HOSPITAL_COMMUNITY)

## 2023-11-14 ENCOUNTER — Emergency Department (HOSPITAL_COMMUNITY)

## 2023-11-14 DIAGNOSIS — I5023 Acute on chronic systolic (congestive) heart failure: Secondary | ICD-10-CM | POA: Diagnosis present

## 2023-11-14 DIAGNOSIS — R739 Hyperglycemia, unspecified: Secondary | ICD-10-CM | POA: Diagnosis present

## 2023-11-14 DIAGNOSIS — J9622 Acute and chronic respiratory failure with hypercapnia: Secondary | ICD-10-CM | POA: Diagnosis present

## 2023-11-14 DIAGNOSIS — Z716 Tobacco abuse counseling: Secondary | ICD-10-CM

## 2023-11-14 DIAGNOSIS — Z86718 Personal history of other venous thrombosis and embolism: Secondary | ICD-10-CM | POA: Diagnosis not present

## 2023-11-14 DIAGNOSIS — I10 Essential (primary) hypertension: Secondary | ICD-10-CM

## 2023-11-14 DIAGNOSIS — I21A1 Myocardial infarction type 2: Secondary | ICD-10-CM | POA: Diagnosis present

## 2023-11-14 DIAGNOSIS — Z923 Personal history of irradiation: Secondary | ICD-10-CM

## 2023-11-14 DIAGNOSIS — Z8249 Family history of ischemic heart disease and other diseases of the circulatory system: Secondary | ICD-10-CM | POA: Diagnosis not present

## 2023-11-14 DIAGNOSIS — E8729 Other acidosis: Secondary | ICD-10-CM | POA: Diagnosis present

## 2023-11-14 DIAGNOSIS — Z881 Allergy status to other antibiotic agents status: Secondary | ICD-10-CM

## 2023-11-14 DIAGNOSIS — F411 Generalized anxiety disorder: Secondary | ICD-10-CM | POA: Diagnosis present

## 2023-11-14 DIAGNOSIS — R7989 Other specified abnormal findings of blood chemistry: Secondary | ICD-10-CM | POA: Diagnosis not present

## 2023-11-14 DIAGNOSIS — I4581 Long QT syndrome: Secondary | ICD-10-CM | POA: Diagnosis not present

## 2023-11-14 DIAGNOSIS — R9431 Abnormal electrocardiogram [ECG] [EKG]: Secondary | ICD-10-CM | POA: Insufficient documentation

## 2023-11-14 DIAGNOSIS — J9621 Acute and chronic respiratory failure with hypoxia: Secondary | ICD-10-CM | POA: Diagnosis present

## 2023-11-14 DIAGNOSIS — Z1152 Encounter for screening for COVID-19: Secondary | ICD-10-CM | POA: Diagnosis not present

## 2023-11-14 DIAGNOSIS — Z880 Allergy status to penicillin: Secondary | ICD-10-CM

## 2023-11-14 DIAGNOSIS — I2510 Atherosclerotic heart disease of native coronary artery without angina pectoris: Secondary | ICD-10-CM | POA: Diagnosis present

## 2023-11-14 DIAGNOSIS — F17210 Nicotine dependence, cigarettes, uncomplicated: Secondary | ICD-10-CM | POA: Diagnosis present

## 2023-11-14 DIAGNOSIS — J449 Chronic obstructive pulmonary disease, unspecified: Secondary | ICD-10-CM | POA: Diagnosis present

## 2023-11-14 DIAGNOSIS — R0603 Acute respiratory distress: Secondary | ICD-10-CM | POA: Diagnosis not present

## 2023-11-14 DIAGNOSIS — M7989 Other specified soft tissue disorders: Secondary | ICD-10-CM | POA: Diagnosis not present

## 2023-11-14 DIAGNOSIS — Z23 Encounter for immunization: Secondary | ICD-10-CM

## 2023-11-14 DIAGNOSIS — Z79899 Other long term (current) drug therapy: Secondary | ICD-10-CM | POA: Diagnosis not present

## 2023-11-14 DIAGNOSIS — I5181 Takotsubo syndrome: Secondary | ICD-10-CM | POA: Diagnosis present

## 2023-11-14 DIAGNOSIS — R079 Chest pain, unspecified: Secondary | ICD-10-CM | POA: Diagnosis not present

## 2023-11-14 DIAGNOSIS — I5021 Acute systolic (congestive) heart failure: Secondary | ICD-10-CM

## 2023-11-14 DIAGNOSIS — I429 Cardiomyopathy, unspecified: Secondary | ICD-10-CM | POA: Diagnosis present

## 2023-11-14 DIAGNOSIS — I110 Hypertensive heart disease with heart failure: Secondary | ICD-10-CM | POA: Diagnosis present

## 2023-11-14 DIAGNOSIS — Z85048 Personal history of other malignant neoplasm of rectum, rectosigmoid junction, and anus: Secondary | ICD-10-CM

## 2023-11-14 LAB — HEMOGLOBIN A1C
Hgb A1c MFr Bld: 5 % (ref 4.8–5.6)
Mean Plasma Glucose: 96.8 mg/dL

## 2023-11-14 LAB — ECHOCARDIOGRAM COMPLETE
Area-P 1/2: 4.31 cm2
Calc EF: 48.7 %
Est EF: 45
S' Lateral: 2.2 cm
Single Plane A2C EF: 48.3 %
Single Plane A4C EF: 49.9 %

## 2023-11-14 LAB — CBC WITH DIFFERENTIAL/PLATELET
Abs Immature Granulocytes: 0.04 K/uL (ref 0.00–0.07)
Basophils Absolute: 0.1 K/uL (ref 0.0–0.1)
Basophils Relative: 1 %
Eosinophils Absolute: 0.1 K/uL (ref 0.0–0.5)
Eosinophils Relative: 1 %
HCT: 46.5 % — ABNORMAL HIGH (ref 36.0–46.0)
Hemoglobin: 15.1 g/dL — ABNORMAL HIGH (ref 12.0–15.0)
Immature Granulocytes: 1 %
Lymphocytes Relative: 27 %
Lymphs Abs: 1.5 K/uL (ref 0.7–4.0)
MCH: 32.1 pg (ref 26.0–34.0)
MCHC: 32.5 g/dL (ref 30.0–36.0)
MCV: 98.7 fL (ref 80.0–100.0)
Monocytes Absolute: 0.3 K/uL (ref 0.1–1.0)
Monocytes Relative: 5 %
Neutro Abs: 3.7 K/uL (ref 1.7–7.7)
Neutrophils Relative %: 65 %
Platelets: 290 K/uL (ref 150–400)
RBC: 4.71 MIL/uL (ref 3.87–5.11)
RDW: 14.6 % (ref 11.5–15.5)
WBC: 5.7 K/uL (ref 4.0–10.5)
nRBC: 0 % (ref 0.0–0.2)

## 2023-11-14 LAB — BASIC METABOLIC PANEL WITH GFR
Anion gap: 17 — ABNORMAL HIGH (ref 5–15)
BUN: 8 mg/dL (ref 8–23)
CO2: 19 mmol/L — ABNORMAL LOW (ref 22–32)
Calcium: 8.3 mg/dL — ABNORMAL LOW (ref 8.9–10.3)
Chloride: 100 mmol/L (ref 98–111)
Creatinine, Ser: 0.75 mg/dL (ref 0.44–1.00)
GFR, Estimated: >60 mL/min (ref >60)
Glucose, Bld: 75 mg/dL (ref 70–99)
Potassium: 3.7 mmol/L (ref 3.5–5.1)
Sodium: 136 mmol/L (ref 135–145)

## 2023-11-14 LAB — I-STAT VENOUS BLOOD GAS, ED
Acid-base deficit: 8 mmol/L — ABNORMAL HIGH (ref 0.0–2.0)
Bicarbonate: 21.8 mmol/L (ref 20.0–28.0)
Calcium, Ion: 1.16 mmol/L (ref 1.15–1.40)
HCT: 47 % — ABNORMAL HIGH (ref 36.0–46.0)
Hemoglobin: 16 g/dL — ABNORMAL HIGH (ref 12.0–15.0)
O2 Saturation: 86 %
Potassium: 3.8 mmol/L (ref 3.5–5.1)
Sodium: 139 mmol/L (ref 135–145)
TCO2: 24 mmol/L (ref 22–32)
pCO2, Ven: 61.5 mmHg — ABNORMAL HIGH (ref 44–60)
pH, Ven: 7.158 — CL (ref 7.25–7.43)
pO2, Ven: 66 mmHg — ABNORMAL HIGH (ref 32–45)

## 2023-11-14 LAB — PROCALCITONIN: Procalcitonin: <0.1 ng/mL

## 2023-11-14 LAB — TROPONIN I (HIGH SENSITIVITY)
Troponin I (High Sensitivity): 174 ng/L (ref <18)
Troponin I (High Sensitivity): 411 ng/L (ref <18)
Troponin I (High Sensitivity): 495 ng/L (ref <18)
Troponin I (High Sensitivity): 499 ng/L (ref <18)

## 2023-11-14 LAB — I-STAT CHEM 8, ED
BUN: 8 mg/dL (ref 8–23)
Calcium, Ion: 1.16 mmol/L (ref 1.15–1.40)
Chloride: 106 mmol/L (ref 98–111)
Creatinine, Ser: 0.8 mg/dL (ref 0.44–1.00)
Glucose, Bld: 223 mg/dL — ABNORMAL HIGH (ref 70–99)
HCT: 48 % — ABNORMAL HIGH (ref 36.0–46.0)
Hemoglobin: 16.3 g/dL — ABNORMAL HIGH (ref 12.0–15.0)
Potassium: 3.8 mmol/L (ref 3.5–5.1)
Sodium: 139 mmol/L (ref 135–145)
TCO2: 22 mmol/L (ref 22–32)

## 2023-11-14 LAB — CBG MONITORING, ED
Glucose-Capillary: 85 mg/dL (ref 70–99)
Glucose-Capillary: 84 mg/dL (ref 70–99)
Glucose-Capillary: 82 mg/dL (ref 70–99)

## 2023-11-14 LAB — HIV ANTIBODY (ROUTINE TESTING W REFLEX): HIV Screen 4th Generation wRfx: NONREACTIVE

## 2023-11-14 LAB — RESP PANEL BY RT-PCR (RSV, FLU A&B, COVID)  RVPGX2
Influenza A by PCR: NEGATIVE
Influenza B by PCR: NEGATIVE
Resp Syncytial Virus by PCR: NEGATIVE
SARS Coronavirus 2 by RT PCR: NEGATIVE

## 2023-11-14 LAB — HEPATITIS PANEL, ACUTE
HCV Ab: REACTIVE — AB
Hep A IgM: NONREACTIVE
Hep B C IgM: NONREACTIVE
Hepatitis B Surface Ag: NONREACTIVE

## 2023-11-14 LAB — COMPREHENSIVE METABOLIC PANEL WITH GFR
ALT: 25 U/L (ref 0–44)
AST: 41 U/L (ref 15–41)
Albumin: 3.6 g/dL (ref 3.5–5.0)
Alkaline Phosphatase: 66 U/L (ref 38–126)
Anion gap: 14 (ref 5–15)
BUN: 8 mg/dL (ref 8–23)
CO2: 19 mmol/L — ABNORMAL LOW (ref 22–32)
Calcium: 8.5 mg/dL — ABNORMAL LOW (ref 8.9–10.3)
Chloride: 104 mmol/L (ref 98–111)
Creatinine, Ser: 0.89 mg/dL (ref 0.44–1.00)
GFR, Estimated: >60 mL/min (ref >60)
Glucose, Bld: 229 mg/dL — ABNORMAL HIGH (ref 70–99)
Potassium: 3.9 mmol/L (ref 3.5–5.1)
Sodium: 137 mmol/L (ref 135–145)
Total Bilirubin: 0.7 mg/dL (ref 0.0–1.2)
Total Protein: 6.7 g/dL (ref 6.5–8.1)

## 2023-11-14 LAB — MRSA NEXT GEN BY PCR, NASAL: MRSA by PCR Next Gen: NOT DETECTED

## 2023-11-14 LAB — I-STAT CG4 LACTIC ACID, ED
Lactic Acid, Venous: 3.6 mmol/L (ref 0.5–1.9)
Lactic Acid, Venous: 1.2 mmol/L (ref 0.5–1.9)

## 2023-11-14 LAB — C-REACTIVE PROTEIN: CRP: <0.5 mg/dL (ref <1.0)

## 2023-11-14 LAB — MAGNESIUM: Magnesium: 1.4 mg/dL — ABNORMAL LOW (ref 1.7–2.4)

## 2023-11-14 LAB — BRAIN NATRIURETIC PEPTIDE: B Natriuretic Peptide: 1097.8 pg/mL — ABNORMAL HIGH (ref 0.0–100.0)

## 2023-11-14 LAB — HEPARIN LEVEL (UNFRACTIONATED): Heparin Unfractionated: 0.38 [IU]/mL (ref 0.30–0.70)

## 2023-11-14 LAB — GLUCOSE, CAPILLARY
Glucose-Capillary: 112 mg/dL — ABNORMAL HIGH (ref 70–99)
Glucose-Capillary: 119 mg/dL — ABNORMAL HIGH (ref 70–99)

## 2023-11-14 LAB — D-DIMER, QUANTITATIVE: D-Dimer, Quant: 1.67 ug{FEU}/mL — ABNORMAL HIGH (ref 0.00–0.50)

## 2023-11-14 MED ORDER — PROCHLORPERAZINE EDISYLATE 10 MG/2ML IJ SOLN: 5.0000 mg | Freq: Four times a day (QID) | INTRAMUSCULAR | Status: DC | PRN

## 2023-11-14 MED ORDER — FUROSEMIDE 10 MG/ML IJ SOLN
40.0000 mg | Freq: Once | INTRAMUSCULAR | Status: AC
  Administered 2023-11-14: 40 mg via INTRAVENOUS
  Filled 2023-11-14: qty 4

## 2023-11-14 MED ORDER — FUROSEMIDE 10 MG/ML IJ SOLN: 40.0000 mg | Freq: Once | INTRAMUSCULAR | Status: DC

## 2023-11-14 MED ORDER — ENOXAPARIN SODIUM 30 MG/0.3ML IJ SOSY
30.0000 mg | PREFILLED_SYRINGE | INTRAMUSCULAR | Status: DC
  Administered 2023-11-14: 30 mg via SUBCUTANEOUS
  Filled 2023-11-14: qty 0.3

## 2023-11-14 MED ORDER — HEPARIN BOLUS VIA INFUSION
2500.0000 [IU] | Freq: Once | INTRAVENOUS | Status: AC
  Administered 2023-11-14: 2500 [IU] via INTRAVENOUS

## 2023-11-14 MED ORDER — POLYETHYLENE GLYCOL 3350 17 G PO PACK: 17.0000 g | PACK | Freq: Every day | ORAL | Status: DC | PRN

## 2023-11-14 MED ORDER — INSULIN ASPART 100 UNIT/ML IJ SOLN: 0.0000 [IU] | INTRAMUSCULAR | Status: DC

## 2023-11-14 MED ORDER — PERFLUTREN LIPID MICROSPHERE
1.0000 mL | INTRAVENOUS | Status: AC | PRN
  Administered 2023-11-14: 2 mL via INTRAVENOUS
  Filled 2023-11-14: qty 10

## 2023-11-14 MED ORDER — MAGNESIUM SULFATE 4 GM/100ML IV SOLN
4.0000 g | Freq: Once | INTRAVENOUS | Status: AC
  Administered 2023-11-14: 4 g via INTRAVENOUS
  Filled 2023-11-14: qty 100

## 2023-11-14 MED ORDER — NITROGLYCERIN IN D5W 200-5 MCG/ML-% IV SOLN: 0.0000 ug/min | INTRAVENOUS | Status: DC

## 2023-11-14 MED ORDER — ASPIRIN 81 MG PO TBEC
81.0000 mg | DELAYED_RELEASE_TABLET | Freq: Every day | ORAL | Status: DC
  Administered 2023-11-15 – 2023-11-17 (×3): 81 mg via ORAL
  Filled 2023-11-14 (×3): qty 1

## 2023-11-14 MED ORDER — DIPHENHYDRAMINE HCL 25 MG PO CAPS
25.0000 mg | ORAL_CAPSULE | Freq: Four times a day (QID) | ORAL | Status: DC | PRN
  Administered 2023-11-14: 25 mg via ORAL
  Filled 2023-11-14: qty 1

## 2023-11-14 MED ORDER — SODIUM CHLORIDE 0.9 % IV SOLN
2.0000 g | INTRAVENOUS | Status: DC
  Administered 2023-11-14: 2 g via INTRAVENOUS
  Filled 2023-11-14: qty 20

## 2023-11-14 MED ORDER — LORAZEPAM 2 MG/ML IJ SOLN
0.5000 mg | Freq: Once | INTRAMUSCULAR | Status: AC
  Administered 2023-11-14: 0.5 mg via INTRAVENOUS
  Filled 2023-11-14: qty 1

## 2023-11-14 MED ORDER — MELATONIN 5 MG PO TABS
5.0000 mg | ORAL_TABLET | Freq: Every evening | ORAL | Status: DC | PRN
  Administered 2023-11-15 – 2023-11-16 (×2): 5 mg via ORAL
  Filled 2023-11-14 (×2): qty 1

## 2023-11-14 MED ORDER — IOHEXOL 350 MG/ML SOLN
75.0000 mL | Freq: Once | INTRAVENOUS | Status: AC | PRN
  Administered 2023-11-14: 75 mL via INTRAVENOUS

## 2023-11-14 MED ORDER — ASPIRIN 81 MG PO CHEW
324.0000 mg | CHEWABLE_TABLET | Freq: Once | ORAL | Status: AC
  Administered 2023-11-14: 324 mg via ORAL
  Filled 2023-11-14: qty 4

## 2023-11-14 MED ORDER — HEPARIN (PORCINE) 25000 UT/250ML-% IV SOLN
850.0000 [IU]/h | INTRAVENOUS | Status: DC
  Administered 2023-11-14: 500 [IU]/h via INTRAVENOUS
  Administered 2023-11-16: 850 [IU]/h via INTRAVENOUS
  Filled 2023-11-14 (×2): qty 250

## 2023-11-14 MED ORDER — SODIUM CHLORIDE 0.9 % IV SOLN
100.0000 mg | Freq: Two times a day (BID) | INTRAVENOUS | Status: DC
  Administered 2023-11-14 – 2023-11-15 (×2): 100 mg via INTRAVENOUS
  Filled 2023-11-14 (×2): qty 100

## 2023-11-14 MED ORDER — POTASSIUM CHLORIDE CRYS ER 20 MEQ PO TBCR
40.0000 meq | EXTENDED_RELEASE_TABLET | Freq: Once | ORAL | Status: AC
  Administered 2023-11-14: 40 meq via ORAL
  Filled 2023-11-14: qty 2

## 2023-11-14 MED ORDER — PNEUMOCOCCAL 20-VAL CONJ VACC 0.5 ML IM SUSY
0.5000 mL | PREFILLED_SYRINGE | INTRAMUSCULAR | Status: AC
  Administered 2023-11-15: 0.5 mL via INTRAMUSCULAR
  Filled 2023-11-14: qty 0.5

## 2023-11-14 MED ORDER — ACETAMINOPHEN 500 MG PO TABS
500.0000 mg | ORAL_TABLET | Freq: Four times a day (QID) | ORAL | Status: AC | PRN
  Administered 2023-11-14 – 2023-11-16 (×4): 500 mg via ORAL
  Filled 2023-11-14 (×4): qty 1

## 2023-11-14 NOTE — H&P (Signed)
 History and Physical  DEMRI P[place]LTON FMW:[id number] DOB: October 16, 1955 DOA: 11/14/2023  Referring physician: Dr. Jerral, EDP  PCP: System, Provider Not In  Outpatient Specialists: None Patient coming from: Home  Chief Complaint: Respiratory distress.  HPI: Wendy [name] is a 68 y.o. female with medical history significant for rectal cancer status post resection, adjuvant therapy, and external radiation (in 2010), history of right internal jugular DVT (previously on Coumadin), hypertension, generalized anxiety disorder, current tobacco user, who presented to the ER due to sudden onset of shortness of breath.  The patient went outside of her home around 1 AM as she does habitually to smoke cigarettes.  After she returns in the house, she felt acutely short of breath and could not catch her breath.  EMS was activated.  The patient was noted to be tachycardic with heart rate in the 130s.  Hypoxic with O2 saturation of 83% on room air.  Not on O2 supplementation at baseline.  Has not seen a doctor in more than 5 years.  She is not on any prescribed medications.  In the ER, severely hypertensive with SBP close to 200, tachycardic, tachypneic.  BNP greater than 1000.  Chest x-ray with hyperinflated lungs with mild pulmonary edema.  Venous blood gas with pH 7.158/61.5/66.  She was placed on BiPAP with improvement of her respiratory distress.  The patient received IV Lasix 40 mg x 1.  Troponin 174, 411.  EDP discussed the case with cardiology.  Cardiology team suspects the elevation of troponin is secondary to demand ischemia, recommended holding off on heparin drip for now.  Admitted by [facility], hospitalist service.  ED Course: Temperature 97.4.  BP 194/94, pulse 109, respiratory 26, O2 saturation at 100% on BiPAP.  Review of Systems: Review of systems as noted in the HPI. All other systems reviewed and are negative.   Past Medical History:  Diagnosis Date   Adenocarcinoma of colon (HCC) 07/2007    Alcohol dependency (HCC)    Allergy    Anxiety    Hep C w/o coma, chronic (HCC)    remote h/o IVD   Hepatitis C    Herpes genitalia    History of DVT (deep vein thrombosis) 12/2007   R jugular vein   HSV-1 (herpes simplex virus 1) infection    HSV-2 (herpes simplex virus 2) infection    Hypertension    Migraine headache    Rectal cancer [facility])    Past Surgical History:  Procedure Laterality Date   ABDOMINAL HYSTERECTOMY  1996   With bilateral oophorectomy.   APPENDECTOMY  1996   BLADDER REPAIR     CLOSED REDUCTION NASAL FRACTURE N/A 10/12/2012   Procedure: CLOSED REDUCTION NASAL BONE FRACTURE WITH STABILZATION ;  Surgeon: Lonni LITTIE Sax, DDS;  Location: [facility] OR;  Service: Oral Surgery;  Laterality: N/A;   COLON SURGERY     COLONOSCOPY W/ BIOPSIES AND POLYPECTOMY     Hx; of   TONSILLECTOMY     TONSILLECTOMY AND ADENOIDECTOMY      Social History:  reports that she has been smoking cigarettes. She has never used smokeless tobacco. She reports current alcohol use of about 20.0 standard drinks of alcohol per week. She reports that she does not use drugs.   Allergies  Allergen Reactions   Amoxicillin Rash   Vancomycin Itching    Erythema and mild itching -  Resolved with IV Benadryl.    Family History  Problem Relation Age of Onset   Colon cancer Mother  Hypertension Mother    Hyperparathyroidism Mother    Colon cancer Father    Cancer Father    Cancer Other       Prior to Admission medications   Medication Sig Start Date End Date Taking? Authorizing Provider  acyclovir  (ZOVIRAX ) 200 MG capsule TAKE ONE TO TWO CAPSULES BY MOUTH DAILY. 09/28/16   Babara, Amy V, PA-C  cetirizine (ZYRTEC) 10 MG tablet Take 10 mg by mouth daily as needed for allergies.     [provider]  diphenoxylate -atropine  (LOMOTIL ) 2.5-0.025 MG tablet Take 2 tablets by mouth 4 (four) times daily as needed for diarrhea or loose stools. 09/28/16   Babara, Amy V, PA-C  ibuprofen  (ADVIL ,MOTRIN ) 600  MG tablet Take 1 tablet (600 mg total) by mouth every 6 (six) hours as needed for pain. 10/04/12   Sheran Rogue, MD  lisinopril -hydrochlorothiazide  (PRINZIDE ,ZESTORETIC ) 10-12.5 MG per tablet Take 1 tablet by mouth daily. Patient not taking: Reported on 05/23/2014 03/06/11   Danton Jon HERO, PA-C    Physical Exam: BP 110/70   Pulse 64   Temp (!) 97.4 F (36.3 C) (Oral)   Resp 16   LMP 01/03/1995   SpO2 100%   General: 68 y.o. year-old female well developed well nourished in no acute distress.  Alert and oriented x3. Cardiovascular: Regular rate and rhythm with no rubs or gallops.  No thyromegaly or JVD noted.  No lower extremity edema. 2/4 pulses in all 4 extremities. Respiratory: Faint rales diffusely.  Poor inspiratory effort.  On BiPAP. Abdomen: Soft nontender nondistended with normal bowel sounds x4 quadrants. Muskuloskeletal: No cyanosis, clubbing or edema noted bilaterally Neuro: CN II-XII intact, strength, sensation, reflexes Skin: No ulcerative lesions noted or rashes Psychiatry: Judgement and insight appear normal. Mood is appropriate for condition and setting          Labs on Admission:  Basic Metabolic Panel: Recent Labs  Lab 11/14/23 0210 11/14/23 0223  NA 137 139  139  K 3.9 3.8  3.8  CL 104 106  CO2 19*  --   GLUCOSE 229* 223*  BUN 8 8  CREATININE 0.89 0.80  CALCIUM 8.5*  --    Liver Function Tests: Recent Labs  Lab 11/14/23 0210  AST 41  ALT 25  ALKPHOS 66  BILITOT 0.7  PROT 6.7  ALBUMIN 3.6   No results for input(s): LIPASE, AMYLASE in the last 168 hours. No results for input(s): AMMONIA in the last 168 hours. CBC: Recent Labs  Lab 11/14/23 0210 11/14/23 0223  WBC 5.7  --   NEUTROABS 3.7  --   HGB 15.1* 16.0*  16.3*  HCT 46.5* 47.0*  48.0*  MCV 98.7  --   PLT 290  --    Cardiac Enzymes: No results for input(s): CKTOTAL, CKMB, CKMBINDEX, TROPONINI in the last 168 hours.  BNP (last 3 results) Recent Labs     11/14/23 0210  BNP 1,097.8*    ProBNP (last 3 results) No results for input(s): PROBNP in the last 8760 hours.  CBG: No results for input(s): GLUCAP in the last 168 hours.  Radiological Exams on Admission: DG Chest Portable 1 View Result Date: 11/14/2023 EXAM: 1 VIEW(S) XRAY OF THE CHEST 11/14/2023 02:34:35 AM COMPARISON: None available. CLINICAL HISTORY: SOB SOB FINDINGS: LUNGS AND PLEURA: Lungs are hyperinflated in keeping with changes of underlying COPD. Interval development of diffuse mild interstitial thickening, or purulent lung base is most in keeping with mild pulmonary edema. No pleural effusion. No pneumothorax. HEART AND  MEDIASTINUM: Cardiac size within normal limits. BONES AND SOFT TISSUES: No acute osseous abnormality. IMPRESSION: 1. Interval development of diffuse mild interstitial thickening, most in keeping with mild pulmonary edema. 2. Hyperinflated lungs with changes of underlying COPD. Electronically signed by: Dorethia Molt MD 11/14/2023 03:38 AM EDT RP Workstation: [id number]    EKG: I independently viewed the EKG done and my findings are as followed: Sinus rhythm rate of 87.Nonspecific ST T changes.  QTc 502.  Assessment/Plan Present on Admission:  Respiratory distress  Principal Problem:   Respiratory distress  Respiratory distress secondary to pulmonary edema Continue diuresis Maintain O2 saturation above 92%. Early mobilization.  Acute hypoxic and hypercarbic respiratory failure in the setting of pulmonary edema Hyperinflated lungs on chest x-ray with pulmonary edema Continue to treat underlying conditions Venous blood gas with pH 7.158/61.5/66 Continue BiPAP due to moderate to severe respiratory acidosis. Wean off BiPAP as able  Elevated troponin, suspect demand ischemia in the setting of acute respiratory distress Troponin 174, 411 Continue to trend troponin No evidence of acute ischemia on twelve-lead EKG Monitor on telemetry Follow-up 2D  echo.  Hypertension, BP is not at goal, elevated Has not been taking blood pressure medications. Has not seen a medical provider in at least 5 years. Continue to closely monitor vital signs.  Generalized anxiety As needed anxiolytics  Hyperglycemia Presented with serum glucose of 229. Follow hemoglobin A1c  QTc prolongation Admission QTc 502 Avoid QTc prolonging agents Optimize magnesium and potassium levels. Monitor on telemetry    Critical care time: 55 minutes.    DVT prophylaxis: Subcu Lovenox daily.  Code Status: Full code.  Family Communication: None at bedside.  Disposition Plan: Admitted to progressive care unit.  Consults called: Cardiology consulted by EDP.  Admission status: Inpatient status.   Status is: Inpatient The patient requires at least 2 midnights for further evaluation and treatment of present condition.   Wendy LOISE Hurst MD [facility] Pager [phone]  If 7PM-7AM, please contact night-coverage [email or web address] Password [email or web address]  11/14/2023, 5:35 AM

## 2023-11-14 NOTE — ED Notes (Signed)
 Echo at bedside

## 2023-11-14 NOTE — ED Notes (Signed)
 Pt requesting benadryl for sinus congestion and drainage. Admitting MD Dibia notified via secure chat.

## 2023-11-14 NOTE — ED Provider Notes (Signed)
 [facility] AT [facility] Provider Note   CSN: [id number] Arrival date & time: 11/14/23  9847     History Chief Complaint  Patient presents with   Shortness of Breath    HPI Wendy [name] is a 68 y.o. female presenting for chief complaint shortness of breath.  Per EMS, patient was unresponsive at time of arrival.  EMS called out by family member for progressive shortness of breath throughout the day.  Patient endorses a longstanding history of smoking, working diagnosis of COPD and though no formal testing with worsening of her shortness of breath over the past year. Acute worsening tonight.  Some mild chest pain though subacute in nature. EMS started patient on CPAP with gross improvement of her oxygen saturations, mental status and respiratory distress. .   Patient's recorded medical, surgical, social, medication list and allergies were reviewed in the Snapshot window as part of the initial history.   Review of Systems   Review of Systems  Constitutional:  Negative for chills and fever.  HENT:  Negative for ear pain and sore throat.   Eyes:  Negative for pain and visual disturbance.  Respiratory:  Positive for chest tightness and shortness of breath. Negative for cough.   Cardiovascular:  Positive for chest pain. Negative for palpitations.  Gastrointestinal:  Negative for abdominal pain and vomiting.  Genitourinary:  Negative for dysuria and hematuria.  Musculoskeletal:  Negative for arthralgias and back pain.  Skin:  Negative for color change and rash.  Neurological:  Negative for seizures and syncope.  All other systems reviewed and are negative.   Physical Exam Updated Vital Signs BP 110/70   Pulse 64   Temp (!) 97.4 F (36.3 C) (Oral)   Resp 16   LMP 01/03/1995   SpO2 100%  Physical Exam Vitals and nursing note reviewed.  Constitutional:      General: She is not in acute distress.    Appearance: She is well-developed.  HENT:      Head: Normocephalic and atraumatic.  Eyes:     Conjunctiva/sclera: Conjunctivae normal.  Cardiovascular:     Rate and Rhythm: Normal rate and regular rhythm.     Heart sounds: No murmur heard. Pulmonary:     Effort: Tachypnea and respiratory distress present.     Breath sounds: Rhonchi and rales present.  Abdominal:     General: There is no distension.     Palpations: Abdomen is soft.     Tenderness: There is no abdominal tenderness. There is no right CVA tenderness or left CVA tenderness.  Musculoskeletal:        General: No swelling or tenderness. Normal range of motion.     Cervical back: Neck supple.  Skin:    General: Skin is warm and dry.  Neurological:     General: No focal deficit present.     Mental Status: She is alert and oriented to person, place, and time. Mental status is at baseline.     Cranial Nerves: No cranial nerve deficit.      ED Course/ Medical Decision Making/ A&P    Procedures .Critical Care  Performed by: Jerral Meth, MD Authorized by: Jerral Meth, MD   Critical care provider statement:    Critical care time (minutes):  90   Critical care was necessary to treat or prevent imminent or life-threatening deterioration of the following conditions:  Circulatory failure, respiratory failure and cardiac failure   Critical care was time spent personally by me  on the following activities:  Development of treatment plan with patient or surrogate, discussions with consultants, evaluation of patient's response to treatment, examination of patient, ordering and review of laboratory studies, ordering and review of radiographic studies, ordering and performing treatments and interventions, pulse oximetry, re-evaluation of patient's condition and review of old charts   Care discussed with: admitting provider      Medications Ordered in ED Medications  nitroGLYCERIN 50 mg in dextrose 5 % 250 mL (0.2 mg/mL) infusion (0 mcg/min Intravenous Hold  11/14/23 0218)  aspirin chewable tablet 324 mg (has no administration in time range)  enoxaparin (LOVENOX) injection 30 mg (has no administration in time range)  acetaminophen  (TYLENOL ) tablet 500 mg (has no administration in time range)  prochlorperazine (COMPAZINE) injection 5 mg (has no administration in time range)  melatonin tablet 5 mg (has no administration in time range)  polyethylene glycol (MIRALAX / GLYCOLAX) packet 17 g (has no administration in time range)  LORazepam (ATIVAN) injection 0.5 mg (0.5 mg Intravenous Given 11/14/23 0342)  furosemide (LASIX) injection 40 mg (40 mg Intravenous Given 11/14/23 0506)    Medical Decision Making:   This is a 68 year old female presenting in acute respiratory distress.  Her history of present illness and physical exam findings are most consistent with COPD exacerbation versus heart failure exacerbation.  The degree of rhonchi and rails as well as degree of hypertension is more consistent with acute pulmonary edema likely secondary to developing heart failure with gross volume overload.  She had a dramatic response to BiPAP and has had improvement of her heart rate, blood pressure and respiratory symptoms.  X-ray does show pleural effusions and pulmonary edema that is persistent. Patient started on Lasix in the emergency room and serial troponins were drawn.  They were uptrending.  I consulted cardiology who stated that this is likely secondary to demand from her respiratory distress.  They agreed with aspirin but recommended holding off on heparin until formal echocardiogram can be completed and further troponins were drawn. Patient reassessed frequently while in the emergency room, gradually improving.  Initial lactic positive though downtrending with management of her respiratory symptoms.  Infectious pathology favored grossly less likely based on lack of fever or leukocytosis. Discussed with hospitalist who agreed with admission for ongoing care  and management.  Clinical Impression: No diagnosis found.   Admit   Final Clinical Impression(s) / ED Diagnoses Final diagnoses:  None    Rx / DC Orders ED Discharge Orders     None         Jerral Meth, MD 11/14/23 [phone]

## 2023-11-14 NOTE — Progress Notes (Signed)
 PHARMACY - ANTICOAGULATION CONSULT NOTE  Pharmacy Consult for Heparin Indication: chest pain/ACS  Allergies  Allergen Reactions   Amoxicillin Rash   Vancomycin Itching    Erythema and mild itching -  Resolved with IV Benadryl.   Erythromycin Base Hives and Other (See Comments)    Pt states it makes her shaky.    Patient Measurements:    Vital Signs: Temp: 97.4 F (36.3 C) (10/31 1005) Temp Source: Axillary (10/31 0622) BP: 123/71 (10/31 1245) Pulse Rate: 62 (10/31 1245)  Labs: Recent Labs    11/14/23 0210 11/14/23 0223 11/14/23 0416 11/14/23 0730 11/14/23 0829  HGB 15.1* 16.0*  16.3*  --   --   --   HCT 46.5* 47.0*  48.0*  --   --   --   PLT 290  --   --   --   --   CREATININE 0.89 0.80  --   --   --   TROPONINIHS 174*  --  411* 499* 495*    CrCl cannot be calculated (Unknown ideal weight.).   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  No AC PTA, prophylactic enoxaparin dose last given @ 0930. Confirmed with RN, bed weight 93.2 lbs.    Goal of Therapy:  Heparin level 0.3-0.7 units/ml Monitor platelets by anticoagulation protocol: Yes   Plan:  Give 2500 units IV x1  Start heparin infusion at 500 units/hr Check heparin level in 6 hours and daily while on heparin Continue to monitor H&H and platelets  Thank you for allowing pharmacy to be a part of this patient's care.  Shelba Collier, PharmD, BCPS Clinical Pharmacist

## 2023-11-14 NOTE — ED Triage Notes (Signed)
 Sudden onset of shortness of breath while sitting on couch. EMS reports crackles, 83% on RA upon arrival, tachy in 130s. Does not use 02 at baseline, arrives on CPAP

## 2023-11-14 NOTE — Progress Notes (Addendum)
 Repeat EKG reviewed. Still with baseline artifact, but NSR with improvement of ST/TW changes and QT prolongation. Continue plan as discussed. Addendum: d-dimer elevated at 1.67. Proceed with CTA to exclude PE. K 3.7, Mg 1.4. Will supp with KCl 40meq x1, mag sulfate 4g. Repeat lytes in AM. 2nd addendum: CTA results reviewed, no PE. Trace bilateral effusions (planning PM Lasix dose). Relayed question of aspiration/PNA to IM.

## 2023-11-14 NOTE — Progress Notes (Signed)
 PHARMACY - ANTICOAGULATION CONSULT NOTE  Pharmacy Consult for Heparin Indication: chest pain/ACS  Allergies  Allergen Reactions   Amoxicillin Rash   Vancomycin Itching    Erythema and mild itching -  Resolved with IV Benadryl.   Erythromycin Base Hives and Other (See Comments)    Pt states it makes her shaky.    Patient Measurements: Height: 5' 2 (157.5 cm) Weight: 42.6 kg (94 lb) IBW/kg (Calculated) : 50.1 HEPARIN DW (KG): 42.6  Vital Signs: Temp: 97.9 F (36.6 C) (10/31 1937) Temp Source: Oral (10/31 1937) BP: 126/64 (10/31 1937) Pulse Rate: 66 (10/31 1937)  Labs: Recent Labs    11/14/23 0210 11/14/23 0223 11/14/23 0416 11/14/23 0730 11/14/23 0829 11/14/23 1343 11/14/23 2123  HGB 15.1* 16.0*  16.3*  --   --   --   --   --   HCT 46.5* 47.0*  48.0*  --   --   --   --   --   PLT 290  --   --   --   --   --   --   HEPARINUNFRC  --   --   --   --   --   --  0.38  CREATININE 0.89 0.80  --   --   --  0.75  --   TROPONINIHS 174*  --  411* 499* 495*  --   --     Estimated Creatinine Clearance: 46.5 mL/min (by C-G formula based on SCr of 0.75 mg/dL).   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 No anticoagulation prior to admission. Pharmacy consulted for heparin.  Confirmed with RN, bed weight 93.2 lbs.   Heparin level 0.38 is therapeutic on 500 units/hr.    Goal of Therapy:  Heparin level 0.3-0.7 units/ml Monitor platelets by anticoagulation protocol: Yes   Plan:   Heparin infusion 500 units/hr Monitor daily heparin level, CBC, signs/symptoms of bleeding    Thank you for allowing pharmacy to be a part of this patient's care. Jinnie Door, PharmD, BCPS, BCCP Clinical Pharmacist  Please check AMION for all [facility] Pharmacy phone  numbers After 10:00 PM, call Main Pharmacy [phone]

## 2023-11-14 NOTE — Consult Note (Addendum)
 Cardiology Consultation   [name] MRN: [id number]; DOB: March 09, 1955  Admit date: 11/14/2023 Date of Consult: 11/14/2023  PCP:  System, Provider Not In   [facility] Providers Cardiologist: New Click here to update MD or APP on Care Team, Refresh:1}     Patient Profile: Wendy [name] is a 68 y.o. female with a hx of rectal cancer s/p resection/adjuvant therapy/external radiation, RIJ DVT 2009 previously on Coumadin (patient does not recall details), HTN, generalized anxiety disorder, tobacco use, hepatitis C, THC use, ongoing tobacco use, remote hx IVDA, former alcohol dependency who is being seen 11/14/2023 for the evaluation of elevated troponin/CHF at the request of Dr. Carlota.  History of Present Illness:  Wendy [name] has no prior cardiac history. The patient has a longstanding history of tobacco abuse but no prior formal testing for COPD and has not seen a doctor in several years. She used to drink heavily per chart but reports she has not been drinking the last 2 years. She smokes marijuana. She noticed some mild ankle edema over the last week. She went outside around 1am to smoke. Upon return into the house she had sudden onset of SOB and contacted EMS. She also had some heaviness in her chest, but no overt chest pain recently. On their arrival she was 83% RA and tachycardic in the 130s, placed on BiPAP. In the ER she was hypertensive 195/94, tachycardic, and tachypneic. CXR showed interval development of diffuse mild interstitial thickening/possible mild pulmonary edema, underlying COPD. Labs showed lactic acidosis 3.6, BNP 1097, Hgb 15.1, glucose 229, A1c 5.0, Covid/flu/RSV neg, Cr wnl, hsTroponin 174->411->499->495, UDS pending. 2D echo showed EF 45% with akinesis of the apical lateral segment, mid inferoseptal segment, apical septal segment, and apex, moderate AI. She received 0.5mg  IV ativan and 40mg  IV Lasix and de-ecalated to [place] O2. BP has normalized.  Her SOB has improved significantly, just doesn't feel completely back to baseline yet. She has had some sinus congestion but no  recent fevers, chills, cough, or syncope.   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   APPENDECTOMY  1996   BLADDER REPAIR     CLOSED REDUCTION NASAL FRACTURE N/A 10/12/2012   Procedure: CLOSED REDUCTION NASAL BONE FRACTURE WITH STABILZATION ;  Surgeon: Lonni LITTIE Sax, DDS;  Location: [facility] OR;  Service: Oral Surgery;  Laterality: N/A;   COLON SURGERY     COLONOSCOPY W/ BIOPSIES AND POLYPECTOMY     Hx; of   TONSILLECTOMY     TONSILLECTOMY AND ADENOIDECTOMY       Home Medications:  Prior to Admission medications   Medication Sig Start Date End Date Taking? Authorizing Provider  acetaminophen  (TYLENOL ) 325 MG tablet Take 650 mg by mouth every 6 (six) hours as needed for mild pain (pain score 1-3) or headache.   Yes [provider]  ibuprofen  (ADVIL ) 200 MG tablet Take 400 mg by mouth every 6 (six) hours as needed for headache or mild pain (pain score 1-3).   Yes [provider]    Scheduled Meds:  [START ON 11/15/2023] aspirin EC  81 mg Oral Daily   insulin aspart  0-9 Units Subcutaneous Q4H   Continuous Infusions:  PRN Meds: acetaminophen , diphenhydrAMINE, melatonin, polyethylene glycol, prochlorperazine  Allergies:    Allergies  Allergen Reactions   Amoxicillin Rash   Vancomycin Itching    Erythema and mild itching -   Resolved with IV Benadryl.   Erythromycin Base Hives and Other (See Comments)    Pt states it makes her shaky.    Social History:   Social History   Socioeconomic History   Marital status: Single    Spouse name: Not on file   Number of children: Not on file   Years of education: Not on file   Highest education level: Not on file  Occupational History   Not on file  Tobacco Use   Smoking status: Every Day    Current packs/day: 1.50    Types: Cigarettes   Smokeless tobacco: Never  Substance and Sexual Activity   Alcohol use: Yes    Alcohol/week: 20.0 standard drinks of alcohol    Types: 20 Shots of liquor per week   Drug use: No   Sexual activity: Not on file  Other Topics Concern   Not on file  Social History Narrative   Not on file   Social Drivers of Health   Financial Resource Strain: Not on file  Food Insecurity: Not on file  Transportation Needs: Not on file  Physical Activity: Not on file  Stress: Not on file  Social Connections: Not on file  Intimate Partner Violence: Not on file    Family History:   Family History  Problem Relation Age of Onset   Colon cancer Mother    Hypertension Mother    Hyperparathyroidism Mother    Colon cancer Father    Cancer Father    Cancer Other      ROS:  Please see the history of present illness.   All other ROS reviewed and negative.     Physical Exam/Data: Vitals:   11/14/23 0945 11/14/23 1005 11/14/23 1100 11/14/23 1245  BP: (!) 128/93  125/83 123/71  Pulse: 74  65 62  Resp: 17  16 17   Temp:  (!) 97.4 F (36.3 C)    TempSrc:      SpO2: 100%  100% 100%   No intake or output data in the 24 hours ending 11/14/23 1319    06/23/2014    2:32 PM 06/08/2014    2:03 PM 06/01/2014    2:31 PM  Last 3 Weights  Weight (lbs) 104 lb 3.2 oz 101 lb 101 lb  Weight (kg) 47.265 kg 45.813 kg 45.813 kg     There is no height or weight on file to calculate BMI.  General: Cachectic appearing WF in no acute distress. Head:  Normocephalic, atraumatic, sclera non-icteric, no xanthomas, nares are without discharge. Neck: Negative for carotid bruits. JVP not elevated. Lungs: Coarse BS bilaterally to auscultation without wheezes, rales, or rhonchi. Breathing is unlabored. Heart: RRR S1 S2 without murmurs, rubs, or gallops.  Abdomen: Soft, non-tender, non-distended with normoactive bowel sounds. No rebound/guarding. Extremities: No clubbing or cyanosis. No edema. Distal pedal pulses are 2+ and equal bilaterally. Neuro: Alert and oriented X 3. Moves all extremities spontaneously.  Psych:  Responds to questions appropriately with a stoic affect.  EKG:  The EKG was personally reviewed and demonstrates:  NSR 87bpm, left atrial enlargement, old anterior infarct, diffuse primarily TW changes with TWI II, III, avF, V5-V6, and flattening I, avL  Telemetry:  Telemetry was personally reviewed and demonstrates:  NSR  Relevant CV Studies:  Echo 11/14/23  1. Left ventricular ejection fraction, by estimation, is 45%. Left  ventricular ejection fraction by 2D MOD biplane is 48.7 %. The left  ventricle has mildly decreased function. The left ventricle demonstrates  regional wall motion abnormalities (see  scoring diagram/findings for description). The left ventricular internal  cavity size was mildly dilated. Left ventricular diastolic parameters are  consistent with Grade I diastolic dysfunction (impaired relaxation).   2. Right ventricular systolic function is normal. The right ventricular  size is normal. There is normal pulmonary artery systolic pressure.   3. Left atrial size was mildly dilated.   4. A small pericardial effusion is present. There is no evidence of  cardiac tamponade.   5. The mitral valve is normal in structure. Trivial mitral valve  regurgitation. No evidence of mitral stenosis.   6. The aortic valve is calcified. Aortic valve regurgitation is moderate.  Aortic valve sclerosis is present, with no evidence  of aortic valve  stenosis.   7. The inferior vena cava is normal in size with greater than 50%  respiratory variability, suggesting right atrial pressure of 3 mmHg.   Conclusion(s)/Recommendation(s): Mildly reduced LV systolic function =  45%. Regional wall motion abnormalities in an LAD distribution. Relative  preservation of the basal contractility which can be seen in Takotsubo as  well. Moderate AR. Recommend  cardiology consultation for further evaluation.   Laboratory Data: High Sensitivity Troponin:   Recent Labs  Lab 11/14/23 0210 11/14/23 0416 11/14/23 0730 11/14/23 0829  TROPONINIHS 174* 411* 499* 495*     Chemistry Recent Labs  Lab 11/14/23 0210 11/14/23 0223  NA 137 139  139  K 3.9 3.8  3.8  CL 104 106  CO2 19*  --   GLUCOSE 229* 223*  BUN 8 8  CREATININE 0.89 0.80  CALCIUM 8.5*  --   GFRNONAA >60  --   ANIONGAP 14  --     Recent Labs  Lab 11/14/23 0210  PROT 6.7  ALBUMIN 3.6  AST 41  ALT 25  ALKPHOS 66  BILITOT 0.7   Lipids No results for input(s): CHOL, TRIG, HDL, LABVLDL, LDLCALC, CHOLHDL in the last 168 hours.  Hematology Recent Labs  Lab 11/14/23 0210 11/14/23 0223  WBC 5.7  --   RBC 4.71  --   HGB 15.1* 16.0*  16.3*  HCT 46.5* 47.0*  48.0*  MCV 98.7  --   MCH 32.1  --   MCHC 32.5  --   RDW 14.6  --   PLT 290  --    Thyroid No results for input(s): TSH, FREET4 in the last 168 hours.  BNP Recent Labs  Lab 11/14/23 0210  BNP 1,097.8*    DDimer No results for input(s): DDIMER in the last 168 hours.  Radiology/Studies:  ECHOCARDIOGRAM COMPLETE Result Date: 11/14/2023    ECHOCARDIOGRAM REPORT   Patient Name:   Wendy [name] Date of Exam: 11/14/2023 Medical Rec #:  [id number]       Height:       62.5 in Accession #:    [id number]      Weight:  104.2 lb Date of Birth:  06/05/1955       BSA:          1.458 m Patient Age:    68 years        BP:           111/74 mmHg Patient Gender: F               HR:            74 bpm. Exam Location:  Inpatient Procedure: 2D Echo and Intracardiac Opacification Agent (Both Spectral and Color            Flow Doppler were utilized during procedure). Indications:    Chest pain  History:        Patient has no prior history of Echocardiogram examinations.  Sonographer:    Charmaine Gaskins Referring Phys: [id number] CHASE COUNTRYMAN IMPRESSIONS  1. Left ventricular ejection fraction, by estimation, is 45%. Left ventricular ejection fraction by 2D MOD biplane is 48.7 %. The left ventricle has mildly decreased function. The left ventricle demonstrates regional wall motion abnormalities (see scoring diagram/findings for description). The left ventricular internal cavity size was mildly dilated. Left ventricular diastolic parameters are consistent with Grade I diastolic dysfunction (impaired relaxation).  2. Right ventricular systolic function is normal. The right ventricular size is normal. There is normal pulmonary artery systolic pressure.  3. Left atrial size was mildly dilated.  4. A small pericardial effusion is present. There is no evidence of cardiac tamponade.  5. The mitral valve is normal in structure. Trivial mitral valve regurgitation. No evidence of mitral stenosis.  6. The aortic valve is calcified. Aortic valve regurgitation is moderate. Aortic valve sclerosis is present, with no evidence of aortic valve stenosis.  7. The inferior vena cava is normal in size with greater than 50% respiratory variability, suggesting right atrial pressure of 3 mmHg. Conclusion(s)/Recommendation(s): Mildly reduced LV systolic function = 45%. Regional wall motion abnormalities in an LAD distribution. Relative preservation of the basal contractility which can be seen in Takotsubo as well. Moderate AR. Recommend cardiology consultation for further evaluation. FINDINGS  Left Ventricle: Left ventricular ejection fraction, by estimation, is 45%. Left ventricular ejection fraction by 2D MOD biplane is 48.7 %.  The left ventricle has mildly decreased function. The left ventricle demonstrates regional wall motion abnormalities. Definity contrast agent was given IV to delineate the left ventricular endocardial borders. The left ventricular internal cavity size was mildly dilated. There is no left ventricular hypertrophy. Left ventricular diastolic parameters are consistent with Grade I diastolic dysfunction (impaired relaxation).  LV Wall Scoring: The apical lateral segment, mid inferoseptal segment, apical septal segment, and apex are akinetic. The antero-lateral wall and basal inferoseptal segment are normal. Right Ventricle: The right ventricular size is normal. No increase in right ventricular wall thickness. Right ventricular systolic function is normal. There is normal pulmonary artery systolic pressure. The tricuspid regurgitant velocity is 2.54 m/s, and  with an assumed right atrial pressure of 3 mmHg, the estimated right ventricular systolic pressure is 28.8 mmHg. Left Atrium: Left atrial size was mildly dilated. Right Atrium: Right atrial size was normal in size. Pericardium: A small pericardial effusion is present. There is no evidence of cardiac tamponade. Mitral Valve: The mitral valve is normal in structure. Trivial mitral valve regurgitation. No evidence of mitral valve stenosis. Tricuspid Valve: The tricuspid valve is normal in structure. Tricuspid valve regurgitation is mild . No evidence of tricuspid stenosis. Aortic Valve: The aortic valve is calcified.  Aortic valve regurgitation is moderate. Aortic valve sclerosis is present, with no evidence of aortic valve stenosis. Pulmonic Valve: The pulmonic valve was normal in structure. Pulmonic valve regurgitation is not visualized. No evidence of pulmonic stenosis. Aorta: The aortic root and ascending aorta are structurally normal, with no evidence of dilitation. Venous: The inferior vena cava is normal in size with greater than 50% respiratory variability,  suggesting right atrial pressure of 3 mmHg. IAS/Shunts: No atrial level shunt detected by color flow Doppler.  LEFT VENTRICLE PLAX 2D                        Biplane EF (MOD) LVIDd:         4.90 cm         LV Biplane EF:   Left LVIDs:         2.20 cm                          ventricular LV PW:         1.00 cm                          ejection LV IVS:        1.00 cm                          fraction by LVOT diam:     2.10 cm                          2D MOD LVOT Area:     3.46 cm                         biplane is                                                 48.7 %.  LV Volumes (MOD)               Diastology LV vol d, MOD    82.0 ml       LV e' medial:    5.33 cm/s A2C:                           LV E/e' medial:  8.3 LV vol d, MOD    101.0 ml      LV e' lateral:   3.59 cm/s A4C:                           LV E/e' lateral: 12.4 LV vol s, MOD    42.4 ml A2C: LV vol s, MOD    50.6 ml A4C: LV SV MOD A2C:   39.6 ml LV SV MOD A4C:   101.0 ml LV SV MOD BP:    44.7 ml RIGHT VENTRICLE RV Basal diam:  3.10 cm RV Mid diam:    2.60 cm RV S prime:     24.90 cm/s RVSP:           28.8 mmHg LEFT ATRIUM             Index  RIGHT ATRIUM           Index LA diam:        3.10 cm 2.13 cm/m   RA Pressure: 3.00 mmHg LA Vol (A2C):   66.8 ml 45.83 ml/m  RA Area:     12.90 cm LA Vol (A4C):   61.8 ml 42.40 ml/m  RA Volume:   30.20 ml  20.72 ml/m LA Biplane Vol: 65.4 ml 44.87 ml/m   AORTA Ao Root diam: 2.80 cm Ao Asc diam:  2.80 cm MITRAL VALVE               TRICUSPID VALVE MV Area (PHT): 4.31 cm    TR Peak grad:   25.8 mmHg MV Decel Time: 176 msec    TR Vmax:        254.00 cm/s MV E velocity: 44.50 cm/s  Estimated RAP:  3.00 mmHg MV A velocity: 73.90 cm/s  RVSP:           28.8 mmHg MV E/A ratio:  0.60                            SHUNTS                            Systemic Diam: 2.10 cm Georganna Archer Electronically signed by Georganna Archer Signature Date/Time: 11/14/2023/11:18:14 AM    Final    DG Chest Portable 1 View Result  Date: 11/14/2023 EXAM: 1 VIEW(S) XRAY OF THE CHEST 11/14/2023 02:34:35 AM COMPARISON: None available. CLINICAL HISTORY: SOB SOB FINDINGS: LUNGS AND PLEURA: Lungs are hyperinflated in keeping with changes of underlying COPD. Interval development of diffuse mild interstitial thickening, or purulent lung base is most in keeping with mild pulmonary edema. No pleural effusion. No pneumothorax. HEART AND MEDIASTINUM: Cardiac size within normal limits. BONES AND SOFT TISSUES: No acute osseous abnormality. IMPRESSION: 1. Interval development of diffuse mild interstitial thickening, most in keeping with mild pulmonary edema. 2. Hyperinflated lungs with changes of underlying COPD. Electronically signed by: Dorethia Molt MD 11/14/2023 03:38 AM EDT RP Workstation: [id number]     Assessment and Plan:  1. Acute hypoxic respiratory failure - suspect multifactorial in setting of underlying COPD/ongoing tobacco abuse with concomitant acute HFmrEF, improved with BiPAP, lorazepam, and dose of IV Lasix -> de-escalated to [place] O2 - management of potential AECOPD per primary team, would benefit from outpatient pulm referral at discharge - cardiac management as below - given hx of malignancy, prior DVT, and abrupt onset of dyspnea, will check d-dimer - if positive would recommend CTA to exclude PE  2. Acute HFmrEF - no prior echo to compare to - EF 45% with WMA possibly suggestive of LAD distribution versus Takotsubo cardiomyopathy in setting of acute physiologic stress - treated with IV Lasix with improvement, will dose another dose this afternoon then recommend reassess volume status in AM - given dramatic swing in BP to normotensive, hold off additional GDMT pending BP trends  3. Elevated troponin - unclear if this represents demand ischemia from hypoxic event or ACS, will treat as the latter for now - start IV heparin per pharmacy - received 324mg  ASA, start 81mg  in AM - hold off acute BB given acute HF/pulm  issues, can reassess in AM - check lipids in AM and consider statin depending on results - consider cardiac catheterization on Monday for elevated troponin/WMA depending on progress  4. Tobacco abuse,  THC use, prior ETOH abuse - counseled on importance of cessation of tobacco/THC - reports abstaining from alcohol the last 2 years - UDS pending  5. Essential HTN - improved, follow  6. Remote hx of DVT - checking d-dimer as above  7. Prolonged QT interval - suspect due to physiologic demand (cannot exclude underlying ischemia), will recheck EKG this afternoon given clinical improvement, and recheck lytes - avoid QT prolonging agents for now   Risk Assessment/Risk Scores:    TIMI Risk Score for Unstable Angina or Non-ST Elevation MI:   The patient's TIMI risk score is 2, which indicates a 8% risk of all cause mortality, new or recurrent myocardial infarction or need for urgent revascularization in the next 14 days.  [place]  Heart Association (NYHA) Functional Class NYHA Class IV on arrival       For questions or updates, please contact  [facility] Please consult [email or web address] for contact info under      Signed, Raphael LOISE Bring, PA-C  11/14/2023 1:19 PM  Wendy [name] was seen by me today along with Dayna Dunn, PA-C. I have personally performed an evaluation on this patient.  My findings are as follows:  68 y.o. female with history of rectal cancer, RIJ DVT, HTN, tobacco abuse, Hep C, remote history of IVDA, former alcohol abuse admitted with dyspnea and found to have elevated troponin and volume overload. Echo with LVEF 45%. Akinesis of the apical lateral, mid inferoseptal and apical segments. Troponin up to 499. She has been diuresed with IV  Lasix and is feeling much better.   Data: EKG(s) and pertinent labs, studies, etc were personally reviewed and interpreted by me:  EKG: sinus, LVH, prolonged QT Tele: sinus Labs reviewed by me Otherwise, I agree with  data as outlined by the advanced practice provider.  Exam performed by me: Hzw:Uypw, cachectic female in NAD Neck: No JVD Cardiac: RRR, no murmurs Lungs: coarse BS bilaterally Extremities: No LE edema  My Assessment and Plan:  Acute hypoxic resp failure: Her dyspnea is most likely combination of her underlying lung disease with COPD exacerbation with acute on chronic systolic CHF. Check d-dimer to exclude PE. Continue IV Lasix today.  Elevated troponin: Cannot exclude ACS although this is likely demand ischemia. With wall motion abnormality on echo, will treat as ACS for now. Will start IV heparin. She may need an ischemic workup next with cardiac cath.  3.   Prolonged QT: avoid QT prolonging agents Signed,  Lonni Cash, MD  11/14/2023 1:44 PM

## 2023-11-14 NOTE — Plan of Care (Signed)
 [name]

## 2023-11-15 ENCOUNTER — Inpatient Hospital Stay (HOSPITAL_COMMUNITY)

## 2023-11-15 DIAGNOSIS — M7989 Other specified soft tissue disorders: Secondary | ICD-10-CM

## 2023-11-15 DIAGNOSIS — R7989 Other specified abnormal findings of blood chemistry: Secondary | ICD-10-CM | POA: Diagnosis not present

## 2023-11-15 DIAGNOSIS — R0603 Acute respiratory distress: Secondary | ICD-10-CM | POA: Diagnosis not present

## 2023-11-15 LAB — TSH: TSH: 3.03 u[IU]/mL (ref 0.350–4.500)

## 2023-11-15 LAB — BASIC METABOLIC PANEL WITH GFR
Anion gap: 15 (ref 5–15)
BUN: 14 mg/dL (ref 8–23)
CO2: 23 mmol/L (ref 22–32)
Calcium: 8.8 mg/dL — ABNORMAL LOW (ref 8.9–10.3)
Chloride: 98 mmol/L (ref 98–111)
Creatinine, Ser: 0.75 mg/dL (ref 0.44–1.00)
GFR, Estimated: >60 mL/min (ref >60)
Glucose, Bld: 91 mg/dL (ref 70–99)
Potassium: 4.2 mmol/L (ref 3.5–5.1)
Sodium: 136 mmol/L (ref 135–145)

## 2023-11-15 LAB — CBC
HCT: 43.8 % (ref 36.0–46.0)
Hemoglobin: 15 g/dL (ref 12.0–15.0)
MCH: 31.9 pg (ref 26.0–34.0)
MCHC: 34.2 g/dL (ref 30.0–36.0)
MCV: 93.2 fL (ref 80.0–100.0)
Platelets: 273 K/uL (ref 150–400)
RBC: 4.7 MIL/uL (ref 3.87–5.11)
RDW: 14.5 % (ref 11.5–15.5)
WBC: 5.5 K/uL (ref 4.0–10.5)
nRBC: 0 % (ref 0.0–0.2)

## 2023-11-15 LAB — GLUCOSE, CAPILLARY
Glucose-Capillary: 101 mg/dL — ABNORMAL HIGH (ref 70–99)
Glucose-Capillary: 102 mg/dL — ABNORMAL HIGH (ref 70–99)
Glucose-Capillary: 128 mg/dL — ABNORMAL HIGH (ref 70–99)
Glucose-Capillary: 134 mg/dL — ABNORMAL HIGH (ref 70–99)
Glucose-Capillary: 81 mg/dL (ref 70–99)
Glucose-Capillary: 97 mg/dL (ref 70–99)

## 2023-11-15 LAB — PROCALCITONIN: Procalcitonin: <0.1 ng/mL

## 2023-11-15 LAB — LIPID PANEL
Cholesterol: 168 mg/dL (ref 0–200)
HDL: 71 mg/dL (ref >40)
LDL Cholesterol: 82 mg/dL (ref 0–99)
Total CHOL/HDL Ratio: 2.4 ratio
Triglycerides: 73 mg/dL (ref <150)
VLDL: 15 mg/dL (ref 0–40)

## 2023-11-15 LAB — MAGNESIUM: Magnesium: 2.3 mg/dL (ref 1.7–2.4)

## 2023-11-15 LAB — HEPARIN LEVEL (UNFRACTIONATED)
Heparin Unfractionated: 0.19 [IU]/mL — ABNORMAL LOW (ref 0.30–0.70)
Heparin Unfractionated: <0.1 [IU]/mL — ABNORMAL LOW (ref 0.30–0.70)
Heparin Unfractionated: <0.1 [IU]/mL — ABNORMAL LOW (ref 0.30–0.70)

## 2023-11-15 LAB — BRAIN NATRIURETIC PEPTIDE: B Natriuretic Peptide: 1131.7 pg/mL — ABNORMAL HIGH (ref 0.0–100.0)

## 2023-11-15 LAB — PHOSPHORUS: Phosphorus: 3.9 mg/dL (ref 2.5–4.6)

## 2023-11-15 LAB — C-REACTIVE PROTEIN: CRP: 0.6 mg/dL (ref <1.0)

## 2023-11-15 MED ORDER — HYDRALAZINE HCL 20 MG/ML IJ SOLN: 10.0000 mg | Freq: Four times a day (QID) | INTRAMUSCULAR | Status: DC | PRN

## 2023-11-15 MED ORDER — ROSUVASTATIN CALCIUM 5 MG PO TABS
10.0000 mg | ORAL_TABLET | Freq: Every day | ORAL | Status: DC
Start: 2023-11-15 — End: 2023-11-17
  Administered 2023-11-15 – 2023-11-17 (×3): 10 mg via ORAL
  Filled 2023-11-15 (×3): qty 2

## 2023-11-15 MED ORDER — CARVEDILOL 3.125 MG PO TABS
3.1250 mg | ORAL_TABLET | Freq: Two times a day (BID) | ORAL | Status: DC
  Administered 2023-11-15 – 2023-11-17 (×5): 3.125 mg via ORAL
  Filled 2023-11-15 (×5): qty 1

## 2023-11-15 MED ORDER — INSULIN ASPART 100 UNIT/ML IJ SOLN
0.0000 [IU] | Freq: Three times a day (TID) | INTRAMUSCULAR | Status: DC
  Administered 2023-11-16: 1 [IU] via SUBCUTANEOUS
  Filled 2023-11-15: qty 1

## 2023-11-15 MED ORDER — DOXYCYCLINE HYCLATE 100 MG PO TABS
100.0000 mg | ORAL_TABLET | Freq: Two times a day (BID) | ORAL | Status: DC
  Administered 2023-11-15 – 2023-11-17 (×5): 100 mg via ORAL
  Filled 2023-11-15 (×5): qty 1

## 2023-11-15 MED ORDER — PANTOPRAZOLE SODIUM 40 MG PO TBEC
40.0000 mg | DELAYED_RELEASE_TABLET | Freq: Every day | ORAL | Status: DC
  Administered 2023-11-15 – 2023-11-17 (×3): 40 mg via ORAL
  Filled 2023-11-15 (×3): qty 1

## 2023-11-15 MED ORDER — ISOSORBIDE MONONITRATE ER 30 MG PO TB24
30.0000 mg | ORAL_TABLET | Freq: Every day | ORAL | Status: DC
  Administered 2023-11-15 – 2023-11-17 (×3): 30 mg via ORAL
  Filled 2023-11-15 (×3): qty 1

## 2023-11-15 NOTE — Progress Notes (Signed)
 PHARMACY - ANTICOAGULATION CONSULT NOTE  Pharmacy Consult for Heparin Indication: chest pain/ACS  Allergies  Allergen Reactions   Amoxicillin Rash   Vancomycin Itching    Erythema and mild itching -  Resolved with IV Benadryl.   Erythromycin Base Hives and Other (See Comments)    Pt states it makes her shaky.    Patient Measurements: Height: 5' 2 (157.5 cm) Weight: 42.6 kg (94 lb) IBW/kg (Calculated) : 50.1 HEPARIN DW (KG): 42.6  Vital Signs: Temp: 98 F (36.7 C) (11/01 1151) Temp Source: Oral (11/01 1151) BP: 133/85 (11/01 1151) Pulse Rate: 75 (11/01 1151)  Labs: Recent Labs    11/14/23 0210 11/14/23 0223 11/14/23 0416 11/14/23 0730 11/14/23 0829 11/14/23 1343 11/14/23 2123 11/15/23 0413 11/15/23 1349  HGB 15.1* 16.0*  16.3*  --   --   --   --   --  15.0  --   HCT 46.5* 47.0*  48.0*  --   --   --   --   --  43.8  --   PLT 290  --   --   --   --   --   --  273  --   HEPARINUNFRC  --   --   --   --   --   --  0.38 0.19* <0.10*  CREATININE 0.89 0.80  --   --   --  0.75  --  0.75  --   TROPONINIHS 174*  --  411* 499* 495*  --   --   --   --     Estimated Creatinine Clearance: 46.5 mL/min (by C-G formula based on SCr of 0.75 mg/dL).   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  11/1: heparin level subtherapeutic at <0.10 on 600 units/hr. Hgb, PLT WNL. No signs of bleeding or pauses in infusion per RN.  Cardiology wants to treat medically with heparin for 48 hours (11/2 at 1430).   Goal of Therapy:  Heparin level 0.3-0.7 units/ml Monitor platelets by anticoagulation protocol: Yes   Plan:  Increase heparin to 750 units/hr Check heparin level in 6 hours and daily while on heparin F/u DVT ultrasound  Continue to monitor  H&H and platelets  Thank you for allowing pharmacy to be a part of this patient's care.  Izetta Carl, PharmD PGY1 Pharmacy Resident

## 2023-11-15 NOTE — Plan of Care (Signed)

## 2023-11-15 NOTE — Progress Notes (Signed)
 PROGRESS NOTE                                                                                                                                                                                                             Patient Demographics:    Wendy [name], is a 68 y.o. female, DOB - January 02, 1956, FMW:[id number]  Outpatient Primary MD for the patient is System, Provider Not In    LOS - 1  Admit date - 11/14/2023    Chief Complaint  Patient presents with   Shortness of Breath       Brief Narrative (HPI from H&P)    68 y.o. female with medical history significant for rectal cancer status post resection, adjuvant therapy, and external radiation (in 2010), history of right internal jugular DVT (previously on Coumadin), hypertension, generalized anxiety disorder, current tobacco user, who presented to the ER due to sudden onset of shortness of breath.  The patient went outside of her home around 1 AM as she does habitually to smoke cigarettes.  After she returns in the house, she felt acutely short of breath and could not catch her breath.  EMS was activated.  The patient was noted to be tachycardic with heart rate in the 130s.  Hypoxic with O2 saturation of 83% on room air.  Not on O2 supplementation at baseline.  Has not seen a doctor in more than 5 years.  She is not on any prescribed medications.  She was admitted for acute hypoxic respiratory failure and admitted to the hospital.   Subjective:    Wendy [name] today has, No headache, No chest pain, No abdominal pain - No Nausea, No new weakness tingling or numbness, no cough, no SOB   Assessment  & Plan :   Cute on chronic hypoxic respiratory failure due to pulmonary edema caused by acute on chronic systolic CHF EF 45%, question Takotsubo cardiomyopathy versus nonspecific cardiomyopathy. Has uncontrolled hypertension at baseline and history of smoking.  No chest pain, EKG has no  acute changes, echo noted with EF 45% and wall motion abnormality, seen by cardiology, received Lasix with good improvement hypoxemia resolved, relatively symptom-free.  Clinically does not appear to have aspiration pneumonia, CTA negative, per cardiology on heparin drip, continue for another 24 hours however doubt this was ACS.  May require left heart cath will defer  this to cardiology.  CHF seems to have resolved initiate low-dose beta-blocker continue aspirin, since LDL is above goal will initiate Crestor as well, stable A1c, counseled to quit smoking.   Hypertension, poorly controlled  Start low-dose Coreg and as needed hydralazine   Elevated D-dimer.  Nonspecific, CTA negative, check lower extremity venous duplex.  For now on heparin drip.    Generalized anxiety As needed anxiolytics   QTc prolongation Admission QTc 502 Avoid QTc prolonging agents Optimize magnesium and potassium levels. Monitor on telemetry   Hyperglycemia Presented with serum glucose of 229. Follow hemoglobin A1c  Lab Results  Component Value Date   HGBA1C 5.0 11/14/2023   CBG (last 3)  Recent Labs    11/15/23 0023 11/15/23 0356 11/15/23 0738  GLUCAP 97 102* 81        Condition - Extremely Guarded  Family Communication  :  None  Code Status :  Full  Consults  :  Cards  PUD Prophylaxis : PPI   Procedures  :     Leg US  -   CTA - 1. No evidence of pulmonary embolism. 2. Trace bilateral pleural effusions, right-greater-than-left width bilateral lower lobe dependent atelectasis/consolidation. Superimposed aspiration or pneumonia is not excluded. 3. Reflux of contrast material into the hepatic veins, suggesting a degree of right heart dysfunction. 4. Aortic Atherosclerosis (ICD10-I70.0) and Emphysema (ICD10-J43.9). Coronary artery calcifications.   TTE -  1. Left ventricular ejection fraction, by estimation, is 45%. Left ventricular ejection fraction by 2D MOD biplane is 48.7 %. The left ventricle  has mildly decreased function. The left ventricle demonstrates regional wall motion abnormalities (see scoring diagram/findings for description). The left ventricular internal cavity size was mildly dilated. Left ventricular diastolic parameters are consistent with Grade I diastolic dysfunction (impaired relaxation).  2. Right ventricular systolic function is normal. The right ventricular size is normal. There is normal pulmonary artery systolic pressure.  3. Left atrial size was mildly dilated.  4. A small pericardial effusion is present. There is no evidence of cardiac tamponade.  5. The mitral valve is normal in structure. Trivial mitral valve regurgitation. No evidence of mitral stenosis.  6. The aortic valve is calcified. Aortic valve regurgitation is moderate. Aortic valve sclerosis is present, with no evidence of aortic valve stenosis.  7. The inferior vena cava is normal in size with greater than 50% respiratory variability, suggesting right atrial pressure of 3 mmHg. Conclusion(s)/Recommendation(s): Mildly reduced LV systolic function = 45%. Regional wall motion abnormalities in an LAD distribution. Relative preservation of the basal contractility which can be seen in Takotsubo as well. Moderate AR.      Disposition Plan  :    Status is: Inpatient  DVT Prophylaxis  :  Loevnox    Lab Results  Component Value Date   PLT 273 11/15/2023    Diet :  Diet Order             Diet Heart Room service appropriate? Yes; Fluid consistency: Thin  Diet effective now                    Inpatient Medications  Scheduled Meds:  aspirin EC  81 mg Oral Daily   carvedilol  3.125 mg Oral BID WC   doxycycline  100 mg Oral Q12H   insulin aspart  0-9 Units Subcutaneous Q4H   Continuous Infusions:  heparin 600 Units/hr (11/15/23 0702)   PRN Meds:.acetaminophen , diphenhydrAMINE, hydrALAZINE, melatonin, polyethylene glycol, prochlorperazine  Antibiotics  :  Anti-infectives (From admission,  onward)    Start     Dose/Rate Route Frequency Ordered Stop   11/15/23 1000  doxycycline (VIBRA-TABS) tablet 100 mg        100 mg Oral Every 12 hours 11/15/23 0904     11/14/23 1800  cefTRIAXone (ROCEPHIN) 2 g in sodium chloride  0.9 % 100 mL IVPB  Status:  Discontinued        2 g 200 mL/hr over 30 Minutes Intravenous Every 24 hours 11/14/23 1652 11/15/23 0903   11/14/23 1800  doxycycline (VIBRAMYCIN) 100 mg in sodium chloride  0.9 % 250 mL IVPB  Status:  Discontinued        100 mg 125 mL/hr over 120 Minutes Intravenous Every 12 hours 11/14/23 1652 11/15/23 0903         Objective:   Vitals:   11/14/23 1937 11/14/23 2322 11/15/23 0401 11/15/23 0735  BP: 126/64 121/64 (!) 140/98 (!) 144/93  Pulse: 66 64 70 74  Resp: 16 17 20 20   Temp: 97.9 F (36.6 C) 97.9 F (36.6 C) 98 F (36.7 C) 98.3 F (36.8 C)  TempSrc: Oral Oral Oral Oral  SpO2: 94% 95% 95% 97%  Weight:      Height:        Wt Readings from Last 3 Encounters:  11/14/23 42.6 kg  06/23/14 47.3 kg  06/08/14 45.8 kg     Intake/Output Summary (Last 24 hours) at 11/15/2023 0904 Last data filed at 11/15/2023 0437 Gross per 24 hour  Intake 1075.94 ml  Output 700 ml  Net 375.94 ml     Physical Exam  Awake Alert, No new F.N deficits, Normal affect [place].AT,PERRAL Supple Neck, No JVD,   Symmetrical Chest wall movement, Good air movement bilaterally, CTAB RRR,No Gallops,Rubs or new Murmurs,  +ve B.Sounds, Abd Soft, No tenderness,   No Cyanosis, Clubbing or edema        Data Review:    Recent Labs  Lab 11/14/23 0210 11/14/23 0223 11/15/23 0413  WBC 5.7  --  5.5  HGB 15.1* 16.0*  16.3* 15.0  HCT 46.5* 47.0*  48.0* 43.8  PLT 290  --  273  MCV 98.7  --  93.2  MCH 32.1  --  31.9  MCHC 32.5  --  34.2  RDW 14.6  --  14.5  LYMPHSABS 1.5  --   --   MONOABS 0.3  --   --   EOSABS 0.1  --   --   BASOSABS 0.1  --   --     Recent Labs  Lab 11/14/23 0210 11/14/23 0223 11/14/23 0420 11/14/23 0606  11/14/23 1343 11/15/23 0412 11/15/23 0413  NA 137 139  139  --   --  136  --  136  K 3.9 3.8  3.8  --   --  3.7  --  4.2  CL 104 106  --   --  100  --  98  CO2 19*  --   --   --  19*  --  23  ANIONGAP 14  --   --   --  17*  --  15  GLUCOSE 229* 223*  --   --  75  --  91  BUN 8 8  --   --  8  --  14  CREATININE 0.89 0.80  --   --  0.75  --  0.75  AST 41  --   --   --   --   --   --  ALT 25  --   --   --   --   --   --   ALKPHOS 66  --   --   --   --   --   --   BILITOT 0.7  --   --   --   --   --   --   ALBUMIN 3.6  --   --   --   --   --   --   CRP  --   --   --  <0.5  --  0.6  --   DDIMER  --   --   --   --  1.67*  --   --   PROCALCITON  --   --   --  <0.10  --  <0.10  --   LATICACIDVEN  --  3.6* 1.2  --   --   --   --   TSH  --   --   --   --   --   --  3.030  HGBA1C 5.0  --   --   --   --   --   --   BNP 1,097.8*  --   --   --   --  1,131.7*  --   MG  --   --   --   --  1.4*  --  2.3  PHOS  --   --   --   --   --   --  3.9  CALCIUM 8.5*  --   --   --  8.3*  --  8.8*      Recent Labs  Lab 11/14/23 0210 11/14/23 0223 11/14/23 0420 11/14/23 0606 11/14/23 1343 11/15/23 0412 11/15/23 0413  CRP  --   --   --  <0.5  --  0.6  --   DDIMER  --   --   --   --  1.67*  --   --   PROCALCITON  --   --   --  <0.10  --  <0.10  --   LATICACIDVEN  --  3.6* 1.2  --   --   --   --   TSH  --   --   --   --   --   --  3.030  HGBA1C 5.0  --   --   --   --   --   --   BNP 1,097.8*  --   --   --   --  1,131.7*  --   MG  --   --   --   --  1.4*  --  2.3  CALCIUM 8.5*  --   --   --  8.3*  --  8.8*    --------------------------------------------------------------------------------------------------------------- Lab Results  Component Value Date   CHOL 168 11/15/2023   HDL 71 11/15/2023   LDLCALC 82 11/15/2023   TRIG 73 11/15/2023   CHOLHDL 2.4 11/15/2023    Lab Results  Component Value Date   HGBA1C 5.0 11/14/2023   Recent Labs    11/15/23 0413  TSH 3.030   No results  for input(s): VITAMINB12, FOLATE, FERRITIN, TIBC, IRON, RETICCTPCT in the last 72 hours. ------------------------------------------------------------------------------------------------------------------ Cardiac Enzymes No results for input(s): CKMB, TROPONINI, MYOGLOBIN in the last 168 hours.  Invalid input(s): CK  Micro Results Recent Results (from the past 240 hours)  Resp panel by RT-PCR (RSV, Flu A&B, Covid) Anterior Nasal Swab  Status: None   Collection Time: 11/14/23  2:10 AM   Specimen: Anterior Nasal Swab  Result Value Ref Range Status   SARS Coronavirus 2 by RT PCR NEGATIVE NEGATIVE Final   Influenza A by PCR NEGATIVE NEGATIVE Final   Influenza B by PCR NEGATIVE NEGATIVE Final    Comment: (NOTE) The Xpert Xpress SARS-CoV-2/FLU/RSV plus assay is intended as an aid in the diagnosis of influenza from Nasopharyngeal swab specimens and should not be used as a sole basis for treatment. Nasal washings and aspirates are unacceptable for Xpert Xpress SARS-CoV-2/FLU/RSV testing.  Fact Sheet for Patients: bloggercourse.com  Fact Sheet for Healthcare Providers: seriousbroker.it  This test is not yet approved or cleared by the [place]  FDA and has been authorized for detection and/or diagnosis of SARS-CoV-2 by FDA under an Emergency Use Authorization (EUA). This EUA will remain in effect (meaning this test can be used) for the duration of the COVID-19 declaration under Section 564(b)(1) of the Act, 21 U.S.C. section 360bbb-3(b)(1), unless the authorization is terminated or revoked.     Resp Syncytial Virus by PCR NEGATIVE NEGATIVE Final    Comment: (NOTE) Fact Sheet for Patients: bloggercourse.com  Fact Sheet for Healthcare Providers: seriousbroker.it  This test is not yet approved or cleared by the [place]  FDA and has been authorized for  detection and/or diagnosis of SARS-CoV-2 by FDA under an Emergency Use Authorization (EUA). This EUA will remain in effect (meaning this test can be used) for the duration of the COVID-19 declaration under Section 564(b)(1) of the Act, 21 U.S.C. section 360bbb-3(b)(1), unless the authorization is terminated or revoked.  Performed at [facility], [address], [place], [place] [address]   MRSA Next Gen by PCR, Nasal     Status: None   Collection Time: 11/14/23  4:51 PM   Specimen: Nasal Mucosa; Nasal Swab  Result Value Ref Range Status   MRSA by PCR Next Gen NOT DETECTED NOT DETECTED Final    Comment: (NOTE) The GeneXpert MRSA Assay (FDA approved for NASAL specimens only), is one component of a comprehensive MRSA colonization surveillance program. It is not intended to diagnose MRSA infection nor to guide or monitor treatment for MRSA infections. Test performance is not FDA approved in patients less than 75 years old. Performed at [facility], [address] [place], [place] [address]     Radiology Report DG Chest Port 1 View Result Date: 11/15/2023 CLINICAL DATA:  Shortness of breath. EXAM: PORTABLE CHEST 1 VIEW COMPARISON:  11/14/2023 FINDINGS: The heart size and mediastinal contours are within normal limits. Mildly increased opacity in right lung base, suspicious for developing infiltrate. No pleural effusion or pneumothorax seen. IMPRESSION: Mildly increased opacity in right lung base, suspicious for developing infiltrate/pneumonia. COPD. Electronically Signed   By: Norleen DELENA Kil M.D.   On: 11/15/2023 06:08   CT Angio Chest Pulmonary Embolism (PE) W or WO Contrast Result Date: 11/14/2023 CLINICAL DATA:  Acute onset shortness of breath and tachycardia EXAM: CT ANGIOGRAPHY CHEST WITH CONTRAST TECHNIQUE: Multidetector CT imaging of the chest was performed using the standard protocol during bolus administration of intravenous contrast. Multiplanar CT image  reconstructions and MIPs were obtained to evaluate the vascular anatomy. RADIATION DOSE REDUCTION: This exam was performed according to the departmental dose-optimization program which includes automated exposure control, adjustment of the mA and/or kV according to patient size and/or use of iterative reconstruction technique. CONTRAST:  75mL OMNIPAQUE  IOHEXOL  350 MG/ML SOLN COMPARISON:  Same day chest  radiograph, CT chest dated 01/31/2011 FINDINGS: Cardiovascular: The study is high quality for the evaluation of pulmonary embolism. There are no filling defects in the central, lobar, segmental or subsegmental pulmonary artery branches to suggest acute pulmonary embolism. Great vessels are normal in course and caliber. Normal heart size. No significant pericardial fluid/thickening. Coronary artery calcifications and aortic atherosclerosis. Reflux of contrast material into the hepatic veins, suggesting a degree of right heart dysfunction. Mediastinum/Nodes: Imaged thyroid gland without nodules meeting criteria for imaging follow-up by size. Normal esophagus. No pathologically enlarged axillary, supraclavicular, mediastinal, or hilar lymph nodes. 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 Lungs/Pleura: The central airways are patent. Trace layering secretions in the trachea extending into bilateral bronchi. Moderate centrilobular and paraseptal emphysema. Right apical 6 x 3 mm nodule (7:7), unchanged from 01/31/2011, likely benign, favored scarring. Slightly irregular 5 x 2 mm perifissural left upper lobe nodule (7:49), likely perifissural lymph node. Bilateral lower lobe dependent atelectasis/consolidation. No pneumothorax. Trace bilateral pleural effusions, right-greater-than-left. Upper abdomen: Normal. Musculoskeletal: No acute or abnormal lytic  or blastic osseous lesions. Review of the MIP images confirms the above findings. IMPRESSION: 1. No evidence of pulmonary embolism. 2. Trace bilateral pleural effusions, right-greater-than-left width bilateral lower lobe dependent atelectasis/consolidation. Superimposed aspiration or pneumonia is not excluded. 3. Reflux of contrast material into the hepatic veins, suggesting a degree of right heart dysfunction. 4. Aortic Atherosclerosis (ICD10-I70.0) and Emphysema (ICD10-J43.9). Coronary artery calcifications. Assessment for potential risk factor modification, dietary therapy or pharmacologic therapy may be warranted, if clinically indicated. Electronically Signed   By: Limin  Xu M.D.   On: 11/14/2023 16:39   ECHOCARDIOGRAM COMPLETE Result Date: 11/14/2023    ECHOCARDIOGRAM REPORT   Patient Name:   NATORIA ARCHIBALD Date of Exam: 11/14/2023 Medical Rec #:  [id number]       Height:       62.5 in Accession #:    [id number]      Weight:       104.2 lb Date of Birth:  01/03/1956       BSA:          1.458 m Patient Age:    68 years        BP:           111/74 mmHg Patient Gender: F               HR:           74 bpm. Exam Location:  Inpatient Procedure: 2D Echo and Intracardiac Opacification Agent (Both Spectral and Color            Flow Doppler were utilized during procedure). Indications:    Chest pain  History:        Patient has no prior history of Echocardiogram examinations.  Sonographer:    Charmaine Gaskins Referring Phys: [id number] CHASE COUNTRYMAN IMPRESSIONS  1. Left ventricular ejection fraction, by estimation, is 45%. Left ventricular ejection fraction by 2D MOD biplane is 48.7 %. The left ventricle has mildly decreased function. The left ventricle demonstrates regional wall motion abnormalities (see scoring diagram/findings for description). The left ventricular internal cavity size was mildly dilated. Left ventricular diastolic parameters are consistent with Grade I diastolic dysfunction (impaired relaxation).   2. Right ventricular systolic function is normal. The right ventricular size is normal. There is normal pulmonary artery systolic pressure.  3. Left  atrial size was mildly dilated.  4. A small pericardial effusion is present. There is no evidence of cardiac tamponade.  5. The mitral valve is normal in structure. Trivial mitral valve regurgitation. No evidence of mitral stenosis.  6. The aortic valve is calcified. Aortic valve regurgitation is moderate. Aortic valve sclerosis is present, with no evidence of aortic valve stenosis.  7. The inferior vena cava is normal in size with greater than 50% respiratory variability, suggesting right atrial pressure of 3 mmHg. Conclusion(s)/Recommendation(s): Mildly reduced LV systolic function = 45%. Regional wall motion abnormalities in an LAD distribution. Relative preservation of the basal contractility which can be seen in Takotsubo as well. Moderate AR. Recommend cardiology consultation for further evaluation. FINDINGS  Left Ventricle: Left ventricular ejection fraction, by estimation, is 45%. Left ventricular ejection fraction by 2D MOD biplane is 48.7 %. The left ventricle has mildly decreased function. The left ventricle demonstrates regional wall motion abnormalities. Definity contrast agent was given IV to delineate the left ventricular endocardial borders. The left ventricular internal cavity size was mildly dilated. There is no left ventricular hypertrophy. Left ventricular diastolic parameters are consistent with Grade I diastolic dysfunction (impaired relaxation).  LV Wall Scoring: The apical lateral segment, mid inferoseptal segment, apical septal segment, and apex are akinetic. The antero-lateral wall and basal inferoseptal segment are normal. Right Ventricle: The right ventricular size is normal. No increase in right ventricular wall thickness. Right ventricular systolic function is normal. There is normal pulmonary artery systolic pressure. The tricuspid  regurgitant velocity is 2.54 m/s, and  with an assumed right atrial pressure of 3 mmHg, the estimated right ventricular systolic pressure is 28.8 mmHg. Left Atrium: Left atrial size was mildly dilated. Right Atrium: Right atrial size was normal in size. Pericardium: A small pericardial effusion is present. There is no evidence of cardiac tamponade. Mitral Valve: The mitral valve is normal in structure. Trivial mitral valve regurgitation. No evidence of mitral valve stenosis. Tricuspid Valve: The tricuspid valve is normal in structure. Tricuspid valve regurgitation is mild . No evidence of tricuspid stenosis. Aortic Valve: The aortic valve is calcified. Aortic valve regurgitation is moderate. Aortic valve sclerosis is present, with no evidence of aortic valve stenosis. Pulmonic Valve: The pulmonic valve was normal in structure. Pulmonic valve regurgitation is not visualized. No evidence of pulmonic stenosis. Aorta: The aortic root and ascending aorta are structurally normal, with no evidence of dilitation. Venous: The inferior vena cava is normal in size with greater than 50% respiratory variability, suggesting right atrial pressure of 3 mmHg. IAS/Shunts: No atrial level shunt detected by color flow Doppler.  LEFT VENTRICLE PLAX 2D                        Biplane EF (MOD) LVIDd:         4.90 cm         LV Biplane EF:   Left LVIDs:         2.20 cm                          ventricular LV PW:         1.00 cm                          ejection LV IVS:        1.00 cm  fraction by LVOT diam:     2.10 cm                          2D MOD LVOT Area:     3.46 cm                         biplane is                                                 48.7 %.  LV Volumes (MOD)               Diastology LV vol d, MOD    82.0 ml       LV e' medial:    5.33 cm/s A2C:                           LV E/e' medial:  8.3 LV vol d, MOD    101.0 ml      LV e' lateral:   3.59 cm/s A4C:                           LV E/e' lateral:  12.4 LV vol s, MOD    42.4 ml A2C: LV vol s, MOD    50.6 ml A4C: LV SV MOD A2C:   39.6 ml LV SV MOD A4C:   101.0 ml LV SV MOD BP:    44.7 ml RIGHT VENTRICLE RV Basal diam:  3.10 cm RV Mid diam:    2.60 cm RV S prime:     24.90 cm/s RVSP:           28.8 mmHg LEFT ATRIUM             Index        RIGHT ATRIUM           Index LA diam:        3.10 cm 2.13 cm/m   RA Pressure: 3.00 mmHg LA Vol (A2C):   66.8 ml 45.83 ml/m  RA Area:     12.90 cm LA Vol (A4C):   61.8 ml 42.40 ml/m  RA Volume:   30.20 ml  20.72 ml/m LA Biplane Vol: 65.4 ml 44.87 ml/m   AORTA Ao Root diam: 2.80 cm Ao Asc diam:  2.80 cm MITRAL VALVE               TRICUSPID VALVE MV Area (PHT): 4.31 cm    TR Peak grad:   25.8 mmHg MV Decel Time: 176 msec    TR Vmax:        254.00 cm/s MV E velocity: 44.50 cm/s  Estimated RAP:  3.00 mmHg MV A velocity: 73.90 cm/s  RVSP:           28.8 mmHg MV E/A ratio:  0.60                            SHUNTS                            Systemic Diam: 2.10 cm Georganna Archer Electronically signed by Georganna Archer Signature Date/Time: 11/14/2023/11:18:14 AM  Final    DG Chest Portable 1 View Result Date: 11/14/2023 EXAM: 1 VIEW(S) XRAY OF THE CHEST 11/14/2023 02:34:35 AM COMPARISON: None available. CLINICAL HISTORY: SOB SOB FINDINGS: LUNGS AND PLEURA: Lungs are hyperinflated in keeping with changes of underlying COPD. Interval development of diffuse mild interstitial thickening, or purulent lung base is most in keeping with mild pulmonary edema. No pleural effusion. No pneumothorax. HEART AND MEDIASTINUM: Cardiac size within normal limits. BONES AND SOFT TISSUES: No acute osseous abnormality. IMPRESSION: 1. Interval development of diffuse mild interstitial thickening, most in keeping with mild pulmonary edema. 2. Hyperinflated lungs with changes of underlying COPD. Electronically signed by: Dorethia Molt MD 11/14/2023 03:38 AM EDT RP Workstation: [id number]     Signature  -   Lavada Stank M.D on 11/15/2023 at 9:04  AM   -  To page go to [email or web address]

## 2023-11-15 NOTE — Progress Notes (Signed)
  Progress Note  Patient Name: Wendy [name] Date of Encounter: 11/15/2023 [facility] Cardiologist: Lonni Cash, MD   Interval Summary   Sitting up in bed.  States that she does have a stick.  Vital Signs Vitals:   11/14/23 2322 11/15/23 0401 11/15/23 0735 11/15/23 1151  BP: 121/64 (!) 140/98 (!) 144/93 133/85  Pulse: 64 70 74 75  Resp: 17 20 20 18   Temp: 97.9 F (36.6 C) 98 F (36.7 C) 98.3 F (36.8 C) 98 F (36.7 C)  TempSrc: Oral Oral Oral Oral  SpO2: 95% 95% 97% 96%  Weight:      Height:        Intake/Output Summary (Last 24 hours) at 11/15/2023 1553 Last data filed at 11/15/2023 1500 Gross per 24 hour  Intake 1275.94 ml  Output 700 ml  Net 575.94 ml      11/14/2023    3:15 PM 06/23/2014    2:32 PM 06/08/2014    2:03 PM  Last 3 Weights  Weight (lbs) 94 lb 104 lb 3.2 oz 101 lb  Weight (kg) 42.638 kg 47.265 kg 45.813 kg      Telemetry/ECG  Sinus rhythm no adverse arrhythmias- Personally Reviewed  Physical Exam  GEN: Thin, chronically ill-appearing Neck: No JVD Cardiac: RRR, no murmurs, rubs, or gallops.  Respiratory: Clear to auscultation bilaterally. GI: Soft, nontender, non-distended  MS: No edema  Assessment & Plan   68 year old admitted with hypoxia with echocardiogram demonstrating ejection fraction of 45% mildly elevated troponin up to 499.  Elevated troponin compatible with type II MI in the setting of underlying COPD hypoxic respiratory failure mildly reduced ejection fraction coronary artery calcification.  Not having any chest discomfort. -I think would be reasonable to stop heparin tomorrow after a total of 48 hours. -I would like to try to pursue medication management for her to the best of our abilities.  She does have dramatic swings in her blood pressure so this can be a challenge. -We discussed the possibility of heart catheterization including risks of stroke heart attack death renal impairment bleeding.  At this time,  we would like to hold off unless absolutely necessary.  She is at increased risk for morbidity and mortality. -Continue with aspirin 81 mg, carvedilol 3.125 mg twice a day, isosorbide 30 mg a day, Crestor 10 mg a day.  History of rectal cancer with resection.   For questions or updates, please contact [facility] Please consult [email or web address] for contact info under         Signed, Oneil Parchment, MD

## 2023-11-15 NOTE — TOC Initial Note (Signed)
 Transition of Care [facility]) - Initial/Assessment Note    Patient Details  Name: Wendy [name] MRN: [id number] Date of Birth: February 09, 1955  Transition [facility]) CM/SW Contact:    Marval Gell, RN Phone Number: 11/15/2023, 2:47 PM  Clinical Narrative:                  Spoke to patient at bedside.  She states that she is from home, lives in apartment, so lives in apartment next door and checks on her 3x a day or more. I offered to talk to her son and she declined.  She would like HH services, no preference, referral made to [facility].  She has a cane and RW. She states she prefers her cane. She would like a BSC, this has been ordered through [employer]. TOC will continue to follow  Expected Discharge Plan: Home w Home Health Services Barriers to Discharge: Continued Medical Work up   Patient Goals and CMS Choice Patient states their goals for this hospitalization and ongoing recovery are:: to return home CMS Medicare.gov Compare Post Acute Care list provided to:: Patient Choice offered to / list presented to : Patient      Expected Discharge Plan and Services   Discharge Planning Services: CM Consult Post Acute Care Choice: Home Health, Durable Medical Equipment Living arrangements for the past 2 months: Apartment                 DME Arranged: Bedside commode DME Agency: [employer] Date DME Agency Contacted: 11/15/23 Time DME Agency Contacted: 1446 Representative spoke with at DME Agency: [place] HH Arranged: PT, OT HH Agency: [facility] Date HH Agency Contacted: 11/15/23 Time HH Agency Contacted: 1447 Representative spoke with at [facility] Agency: [place]  Prior Living Arrangements/Services Living arrangements for the past 2 months: Apartment Lives with:: Self   Do you feel safe going back to the place where you live?: Yes               Activities of Daily Living   ADL Screening (condition at time of admission) Independently performs ADLs?: No Does the  patient have a NEW difficulty with bathing/dressing/toileting/self-feeding that is expected to last >3 days?: Yes (Initiates electronic notice to provider for possible OT consult) Does the patient have a NEW difficulty with getting in/out of bed, walking, or climbing stairs that is expected to last >3 days?: Yes (Initiates electronic notice to provider for possible PT consult) Does the patient have a NEW difficulty with communication that is expected to last >3 days?: No Is the patient deaf or have difficulty hearing?: No Does the patient have difficulty seeing, even when wearing glasses/contacts?: No Does the patient have difficulty concentrating, remembering, or making decisions?: No  Permission Sought/Granted                  Emotional Assessment              Admission diagnosis:  Respiratory distress [R06.03] Patient Active Problem List   Diagnosis Date Noted   Respiratory distress 11/14/2023   QT prolongation 11/14/2023   Closed fracture nasal bone 10/12/2012   Rectal cancer (HCC) 12/20/2011   Hep C w/o coma, chronic (HCC)    HSV-1 (herpes simplex virus 1) infection    Migraine headache    History of DVT (deep vein thrombosis)    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   PCP:  System, Provider Not In Pharmacy:   [id number] [place], [place] -  [address] [place] [address] Phone: [phone] Fax: [phone]  [facility] [address], [place] - [address] [place] [address] Phone: [phone] Fax: [phone]  [id number] [place], [place] - [address] [place] [address] Phone: [phone] Fax: [phone]  [id number] [place], [place] - [address] AT [facility] [address] & [facility] [address] [place] [address] Phone: [phone] Fax: [phone]  [id number] - [place], [place] - [address] [place] [address] Phone: [phone] Fax: [phone]     Social Drivers of Health (SDOH) Social History: SDOH Screenings   Food Insecurity: Food Insecurity Present (11/14/2023)  Housing: Low Risk  (11/14/2023)  Transportation Needs: No Transportation Needs (11/14/2023)  Utilities: Not At Risk (11/14/2023)  Social Connections: Socially Isolated (11/14/2023)  Tobacco Use: High Risk (11/14/2023)   SDOH Interventions:     Readmission Risk Interventions     No data to display

## 2023-11-15 NOTE — Progress Notes (Signed)
 PHARMACY - ANTICOAGULATION  Pharmacy Consult for Heparin Indication: chest pain/ACS Brief A/P: Heparin level subtherapeutic Increase Heparin rate  Allergies  Allergen Reactions   Amoxicillin Rash   Vancomycin Itching    Erythema and mild itching -  Resolved with IV Benadryl.   Erythromycin Base Hives and Other (See Comments)    Pt states it makes her shaky.    Patient Measurements: Height: 5' 2 (157.5 cm) Weight: 42.6 kg (94 lb) IBW/kg (Calculated) : 50.1 HEPARIN DW (KG): 42.6  Vital Signs: Temp: 98 F (36.7 C) (11/01 0401) Temp Source: Oral (11/01 0401) BP: 140/98 (11/01 0401) Pulse Rate: 70 (11/01 0401)  Labs: Recent Labs    11/14/23 0210 11/14/23 0223 11/14/23 0416 11/14/23 0730 11/14/23 0829 11/14/23 1343 11/14/23 2123 11/15/23 0413  HGB 15.1* 16.0*  16.3*  --   --   --   --   --  15.0  HCT 46.5* 47.0*  48.0*  --   --   --   --   --  43.8  PLT 290  --   --   --   --   --   --  273  HEPARINUNFRC  --   --   --   --   --   --  0.38 0.19*  CREATININE 0.89 0.80  --   --   --  0.75  --  0.75  TROPONINIHS 174*  --  411* 499* 495*  --   --   --     Estimated Creatinine Clearance: 46.5 mL/min (by C-G formula based on SCr of 0.75 mg/dL).   Assessment: 68 yo female with SOB and elevated troponin, possible ACS, for heparin   Goal of Therapy:  Heparin level 0.3-0.7 units/ml Monitor platelets by anticoagulation protocol: Yes   Plan:   Increase Heparin 600 units/hr Check heparin level in 8 hours.  Cathlyn Arrant, PharmD, BCPS

## 2023-11-15 NOTE — Plan of Care (Signed)
  Problem: Education: Goal: Ability to describe self-care measures that may prevent or decrease complications (Diabetes Survival Skills Education) will improve Outcome: Progressing   Problem: Coping: Goal: Ability to adjust to condition or change in health will improve Outcome: Progressing   Problem: Health Behavior/Discharge Planning: Goal: Ability to identify and utilize available resources and services will improve Outcome: Progressing   Problem: Health Behavior/Discharge Planning: Goal: Ability to manage health-related needs will improve Outcome: Progressing   Problem: Clinical Measurements: Goal: Ability to maintain clinical measurements within normal limits will improve Outcome: Progressing

## 2023-11-15 NOTE — Evaluation (Signed)
 Occupational Therapy Evaluation Patient Details Name: Wendy [name] MRN: [id number] DOB: 14-May-1955 Today's Date: 11/15/2023   History of Present Illness   Pt is a 68 y.o. F who presents 11/14/2023 with sudden onset of shortness of breath.  The patient was noted to be tachycardic with heart rate in the 130s, hypoxic with O2 saturation of 83% on room air and severely hypertensive with SBP close to 200. Chest x-ray with hyperinflated lungs with mild pulmonary edema.  Venous blood gas with pH 7.158/61.5/66.  She was placed on BiPAP with improvement of her respiratory distress.  The patient received IV Lasix 40 mg x 1.  Troponin 174, 411.  EDP discussed the case with cardiology.  Cardiology team suspects the elevation of troponin is secondary to demand ischemia, recommended holding off on heparin drip for now. Significant PMH: rectal cancer status post resection, adjuvant therapy, and external radiation (in 2010), history of right internal jugular DVT (previously on Coumadin), hypertension, generalized anxiety disorder, current tobacco user.     Clinical Impressions At baseline, pt is Ind to Mod I with ADLs and light meal prep, family assist with transportation and other home management tasks, and pt performs functional mobility without an AD. Pt reports 10 falls in the past 6 months. Pt now presents with decreased activity tolerance, generalized B UE weakness, decreased L UE AROM, decreased L UE coordination, increased L UE flexor tone, mild vision impairments (see below for details), decreased cognition, and decreased safety and independence with functional tasks. Pt currently demonstrating ability to largely complete ADLs with Set up to Max assist and functional mobility/transfers with no AD or HHA +1 with Contact guard to Min assist. Pt participated well in session and has good family support. Pt HR in the 90s to low 100s and O2 sat largely >/92% on RA with brief drop to 88% on RA during standing at  sink with O2 quickly recovering to >/92% on A with seated rest break. Pt will benefit from acute OT services to address deficits and increase safety and independence with functional tasks. Post acute discharge, pt will benefit from [facility] OT to include a home safety assessment, paired with continued assistance and frequent supervision from son who lives next door to pt.      If plan is discharge home, recommend the following:   A little help with walking and/or transfers;A little help with bathing/dressing/bathroom;Assistance with cooking/housework;Direct supervision/assist for medications management;Direct supervision/assist for financial management;Assist for transportation;Help with stairs or ramp for entrance (Frequent supervision for safety)     Functional Status Assessment   Patient has had a recent decline in their functional status and demonstrates the ability to make significant improvements in function in a reasonable and predictable amount of time.     Equipment Recommendations   Tub/shower seat     Recommendations for Other Services         Precautions/Restrictions   Precautions Precautions: Fall;Other (comment) Precaution/Restrictions Comments: watch O2 and HR Restrictions Weight Bearing Restrictions Per Provider Order: No     Mobility Bed Mobility Overal bed mobility: Needs Assistance Bed Mobility: Sit to Supine       Sit to supine: Supervision   General bed mobility comments: cues for technique    Transfers Overall transfer level: Needs assistance Equipment used: 1 person hand held assist Transfers: Sit to/from Stand, Bed to chair/wheelchair/BSC Sit to Stand: Contact guard assist, Min assist     Step pivot transfers: Contact guard assist, Min assist     General  transfer comment: cues for safety and technique; Min assist to boost up from toilet; CGA for STS from chair for safety with no physical assist needed      Balance Overall balance  assessment: Needs assistance Sitting-balance support: No upper extremity supported, Feet supported Sitting balance-Leahy Scale: Fair     Standing balance support: Single extremity supported, No upper extremity supported, During functional activity Standing balance-Leahy Scale: Fair Standing balance comment: Able to maintain static stand without support; benefits from support in dynamic standing and ambulation                           ADL either performed or assessed with clinical judgement   ADL Overall ADL's : Needs assistance/impaired Eating/Feeding: Set up;Sitting   Grooming: Contact guard assist;Sitting   Upper Body Bathing: Contact guard assist;Sitting;Cueing for compensatory techniques   Lower Body Bathing: Moderate assistance;Maximal assistance;Cueing for safety;Cueing for compensatory techniques;Sit to/from stand   Upper Body Dressing : Contact guard assist;Minimal assistance;Sitting;Cueing for compensatory techniques   Lower Body Dressing: Moderate assistance;Sit to/from stand;Cueing for compensatory techniques;Cueing for safety   Toilet Transfer: Contact guard assist;Minimal assistance;Regular Toilet;Grab bars;Ambulation (HHA +1 without an AD)   Toileting- Clothing Manipulation and Hygiene: Supervision/safety;Sitting/lateral lean;Set up;Maximal assistance;Sit to/from stand;Cueing for compensatory techniques Toileting - Clothing Manipulation Details (indicate cue type and reason): Set-up/Supervision in sitting; Max assist for peri care and clothing management in standing     Functional mobility during ADLs: Contact guard assist;Minimal assistance;Cueing for safety (with a gait belt; HHA +1 without an AD)       Vision Baseline Vision/History: 1 Wears glasses Ability to See in Adequate Light: 0 Adequate (per pt report, with glasses) Patient Visual Report: No change from baseline;Other (comment) (Pt reports vision currently at baseline; but reports intermittent  vision changes 6 months to 1 year ago with pt reportsing vision was jumping and moving up and down intermittently at that time.) Vision Assessment?: Yes Eye Alignment: Within Functional Limits Ocular Range of Motion: Within Functional Limits Alignment/Gaze Preference: Within Defined Limits Tracking/Visual Pursuits: Requires cues, head turns, or add eye shifts to track (Pt with increased head turn and requiring cues to hold head position when tracking on the Left side) Saccades: Within functional limits Convergence: Within functional limits Visual Fields: No apparent deficits Diplopia Assessment: Other (comment) (none) Depth Perception: Overshoots (with L hand reaching in all directions; R hand WFL reaching in all directions)     Perception         Praxis         Pertinent Vitals/Pain Pain Assessment Pain Assessment: No/denies pain     Extremity/Trunk Assessment Upper Extremity Assessment Upper Extremity Assessment: Right hand dominant;Generalized weakness;RUE deficits/detail;LUE deficits/detail RUE Deficits / Details: generalized weakness; mildly decreased fine motor coordination RUE Sensation: WNL RUE Coordination: decreased fine motor LUE Deficits / Details: generalized weakness with gross shoulder strength 2/5 and strength otherwise grossly 3/5; AROM shoulder flexion to approximately 80 degrees and PROM to approximately 95 degrees; elbow flextion mildly impaired, however, suspect due to IV placement; all other AROM WFL; increased flexor tone; decreased coordination; decreased proprioception; pt reports L UE deficits begining about a year ago and continuing to worsen over time LUE Sensation: decreased proprioception LUE Coordination: decreased fine motor;decreased gross motor   Lower Extremity Assessment Lower Extremity Assessment: Defer to PT evaluation   Cervical / Trunk Assessment Cervical / Trunk Assessment: Normal   Communication Communication Communication: No  apparent difficulties Factors Affecting Communication:  Difficulty expressing self   Cognition Arousal: Alert Behavior During Therapy: WFL for tasks assessed/performed Cognition: Cognition impaired     Awareness: Intellectual awareness intact, Online awareness intact (intermittent online awareness) Memory impairment (select all impairments): Short-term memory, Working memory Attention impairment (select first level of impairment): Selective attention Executive functioning impairment (select all impairments): Organization, Reasoning, Problem solving OT - Cognition Comments: Pt AAOx4 and pleasant throughout session with cognitive deficits noted above. Pt with decreased safety awareness and decreased insight into deficits. Per pt and son's report, pt with increased chanllenges with cognition/problem solving over the past year or so.                 Following commands: Impaired Following commands impaired: Only follows one step commands consistently, Follows multi-step commands inconsistently, Follows multi-step commands with increased time     Cueing  General Comments   Cueing Techniques: Verbal cues;Tactile cues;Visual cues  Dovetailed with PT; Pt son presetn for a portion of session; Pt HR in the 90s to low 100s and O2 sat largely >/92% on RA with brief drop to 88% on RA during standing at sink with O2 quickly recovering to >/92% on A with seated rest break.   Exercises     Shoulder Instructions      Home Living Family/patient expects to be discharged to:: Private residence Living Arrangements: Alone Available Help at Discharge: Family;Available PRN/intermittently (son lives next door; daughter lives in [place]) Type of Home: Apartment Home Access: Stairs to enter Secretary/administrator of Steps: 3 Entrance Stairs-Rails: None Home Layout: One level     Bathroom Shower/Tub: Chief Strategy Officer: Standard Bathroom Accessibility: No   Home Equipment:  Agricultural Consultant (2 wheels);Cane - single point   Additional Comments: Fallen 10 times in last 6 mo. Denies dizzines s or lightheadedness. States if feet are not on floor flat, she will fall. States she has had an inner ear infection for about a year, but she has not seen a provider about this. Denies hearing loss. States some ringing in ears, but this has been going on for many years.      Prior Functioning/Environment Prior Level of Function : Independent/Modified Independent;History of Falls (last six months)             Mobility Comments: Does not use AD at baseline. State RW would not fit in bathroom. States L hand does not work and has to use cane in R if she uses it. Pt reports 10 falls in past 6 months. ADLs Comments: Independent to Mod I for ADLs and light meal prep. Son assists with transportation and some home management tasks.    OT Problem List: Decreased strength;Decreased activity tolerance;Impaired balance (sitting and/or standing);Decreased range of motion;Decreased coordination;Decreased cognition;Decreased safety awareness;Decreased knowledge of use of DME or AE   OT Treatment/Interventions: Self-care/ADL training;Therapeutic exercise;Energy conservation;DME and/or AE instruction;Therapeutic activities;Cognitive remediation/compensation;Patient/family education;Balance training      OT Goals(Current goals can be found in the care plan section)   Acute Rehab OT Goals Patient Stated Goal: to return home OT Goal Formulation: With patient/family Time For Goal Achievement: 11/29/23 Potential to Achieve Goals: Good ADL Goals Pt Will Perform Grooming: with modified independence;standing Pt Will Perform Lower Body Bathing: with contact guard assist;sitting/lateral leans;sit to/from stand Pt Will Perform Lower Body Dressing: with supervision;sitting/lateral leans;sit to/from stand Pt Will Transfer to Toilet: with modified independence;ambulating;regular height toilet (with  least restrictive AD) Pt Will Perform Toileting - Clothing Manipulation and hygiene: with contact  guard assist;sit to/from stand Pt Will Perform Tub/Shower Transfer: with supervision;Tub transfer;ambulating;shower seat (with least restrictive AD) Pt/caregiver will Perform Home Exercise Program: Increased ROM;Increased strength;Both right and left upper extremity;With Supervision;With written HEP provided (L UE AROM; R UE theraband) Additional ADL Goal #1: Patient will demonstrate ability to accurately set up a weekly pill planner with Mod I.   OT Frequency:  Min 2X/week    Co-evaluation              AM-PAC OT 6 Clicks Daily Activity     Outcome Measure Help from another person eating meals?: A Little Help from another person taking care of personal grooming?: A Little Help from another person toileting, which includes using toliet, bedpan, or urinal?: A Lot Help from another person bathing (including washing, rinsing, drying)?: A Lot Help from another person to put on and taking off regular upper body clothing?: A Little Help from another person to put on and taking off regular lower body clothing?: A Lot 6 Click Score: 15   End of Session Equipment Utilized During Treatment: Gait belt Nurse Communication: Mobility status;Other (comment) (Vital signs)  Activity Tolerance: Patient tolerated treatment well Patient left: in bed;with call bell/phone within reach;with bed alarm set;with family/visitor present  OT Visit Diagnosis: Unsteadiness on feet (R26.81);Repeated falls (R29.6);Muscle weakness (generalized) (M62.81);History of falling (Z91.81);Ataxia, unspecified (R27.0);Other symptoms and signs involving cognitive function                Time: 9053-8981 OT Time Calculation (min): 32 min Charges:  OT General Charges $OT Visit: 1 Visit OT Evaluation $OT Eval Moderate Complexity: 1 Mod OT Treatments $Self Care/Home Management : 8-22 mins  Margarie Rockey HERO., OTR/L, MA Acute  Rehab [phone]   Margarie FORBES Horns 11/15/2023, 1:34 PM

## 2023-11-15 NOTE — Progress Notes (Signed)
 [facility] night cross cover note:   CBG monitoring/SSI changed from q4H to AC/HS with low dose SSI.     Eva Pore, DO Hospitalist

## 2023-11-15 NOTE — Evaluation (Signed)
 Clinical/Bedside Swallow Evaluation Patient Details  Name: Wendy [name] MRN: [id number] Date of Birth: 04-15-1955  Today's Date: 11/15/2023 Time: SLP Start Time (ACUTE ONLY): 1010 SLP Stop Time (ACUTE ONLY): 1035 SLP Time Calculation (min) (ACUTE ONLY): 25 min  Past Medical History:  Past Medical History:  Diagnosis Date   Adenocarcinoma of colon (HCC) 07/2007   Alcohol dependency (HCC)    Allergy    Anxiety    Hep C w/o coma, chronic (HCC)    remote h/o IVD   Hepatitis C    Herpes genitalia    History of DVT (deep vein thrombosis) 12/2007   R jugular vein   HSV-1 (herpes simplex virus 1) infection    HSV-2 (herpes simplex virus 2) infection    Hypertension    Migraine headache    Rectal cancer (HCC)    Past Surgical History:  Past Surgical History:  Procedure Laterality Date   ABDOMINAL HYSTERECTOMY  1996   With bilateral oophorectomy.   APPENDECTOMY  1996   BLADDER REPAIR     CLOSED REDUCTION NASAL FRACTURE N/A 10/12/2012   Procedure: CLOSED REDUCTION NASAL BONE FRACTURE WITH STABILZATION ;  Surgeon: Lonni LITTIE Sax, DDS;  Location: [facility] OR;  Service: Oral Surgery;  Laterality: N/A;   COLON SURGERY     COLONOSCOPY W/ BIOPSIES AND POLYPECTOMY     Hx; of   TONSILLECTOMY     TONSILLECTOMY AND ADENOIDECTOMY     HPI:  68 yo female presenting to ED 10/31 with sudden onset of shortness of breath. Admitted with acute on chronic respiratory failure secondary to pulmonary edema caused by acute on chronic systolic CHF. CXR shows opacity in the R lung base, suspicious for infiltrate/pneumonia. PMH: rectal cancer s/p resection and radiation, history of R internal jugular DVT, HTN, generalized anxiety disorder, current tobacco use    Assessment / Plan / Recommendation  Clinical Impression  Pt reports recent difficulty swallowing, specifically with solids, accompanied by globus sensation and symptoms of GER. No signs clinically concerning for aspiration were observed with  consecutive sips of thin liquids. Oral transit and clearance are prompt and complete with self-fed trials of purees and solids. The symptoms pt describes are suspected to represent an esophageal component, could consider dedicated assessment. Discussed esophageal precautions with recommendations to continue current diet. Ongoing SLP f/u is not needed at this time, will sign off. SLP Visit Diagnosis: Dysphagia, unspecified (R13.10)    Aspiration Risk  Mild aspiration risk    Diet Recommendation Regular;Thin liquid    Liquid Administration via: Cup;Straw Medication Administration: Whole meds with liquid Supervision: Patient able to self feed;Intermittent supervision to cue for compensatory strategies Compensations: Slow rate;Small sips/bites Postural Changes: Seated upright at 90 degrees;Remain upright for at least 30 minutes after po intake    Other  Recommendations Recommended Consults: Consider esophageal assessment Oral Care Recommendations: Oral care BID     Assistance Recommended at Discharge    Functional Status Assessment Patient has not had a recent decline in their functional status  Frequency and Duration            Prognosis Prognosis for improved oropharyngeal function: Good Barriers to Reach Goals: Time post onset      Swallow Study   General HPI: 68 yo female presenting to ED 10/31 with sudden onset of shortness of breath. Admitted with acute on chronic respiratory failure secondary to pulmonary edema caused by acute on chronic systolic CHF. CXR shows opacity in the R lung base, suspicious for infiltrate/pneumonia. PMH:  rectal cancer s/p resection and radiation, history of R internal jugular DVT, HTN, generalized anxiety disorder, current tobacco use Type of Study: Bedside Swallow Evaluation Previous Swallow Assessment: none in chart Diet Prior to this Study: Regular;Thin liquids (Level 0) Temperature Spikes Noted: No Respiratory Status: Room air History of Recent  Intubation: No Behavior/Cognition: Alert;Cooperative Oral Cavity Assessment: Within Functional Limits Oral Care Completed by SLP: No Oral Cavity - Dentition: Adequate natural dentition Vision: Functional for self-feeding Self-Feeding Abilities: Able to feed self Patient Positioning: Upright in bed Baseline Vocal Quality: Normal Volitional Cough: Strong;Congested Volitional Swallow: Able to elicit    Oral/Motor/Sensory Function Overall Oral Motor/Sensory Function: Within functional limits   Ice Chips Ice chips: Not tested   Thin Liquid Thin Liquid: Within functional limits Presentation: Straw;Self Fed    Nectar Thick Nectar Thick Liquid: Not tested   Honey Thick Honey Thick Liquid: Not tested   Puree Puree: Within functional limits Presentation: Spoon;Self Fed   Solid     Solid: Within functional limits Presentation: Self Fed      Damien Blumenthal, M.A., CCC-SLP Speech Language Pathology, Acute Rehabilitation Services  Secure Chat preferred [phone]  11/15/2023,10:52 AM

## 2023-11-15 NOTE — Progress Notes (Signed)
 VASCULAR LAB    Bilateral lower extremity venous duplex has been performed.  See CV proc for preliminary results.   Jadarion Halbig, RVT 11/15/2023, 12:39 PM

## 2023-11-15 NOTE — Progress Notes (Signed)
 PHARMACY - ANTICOAGULATION CONSULT NOTE  Pharmacy Consult for Heparin Indication: chest pain/ACS  Allergies  Allergen Reactions   Amoxicillin Rash   Vancomycin Itching    Erythema and mild itching -  Resolved with IV Benadryl.   Erythromycin Base Hives and Other (See Comments)    Pt states it makes her shaky.    Patient Measurements: Height: 5' 2 (157.5 cm) Weight: 42.6 kg (94 lb) IBW/kg (Calculated) : 50.1 HEPARIN DW (KG): 42.6  Vital Signs: Temp: 98.3 F (36.8 C) (11/01 1929) Temp Source: Oral (11/01 1929) BP: 125/82 (11/01 1929) Pulse Rate: 78 (11/01 1929)  Labs: Recent Labs    11/14/23 0210 11/14/23 0223 11/14/23 0416 11/14/23 0730 11/14/23 0829 11/14/23 1343 11/14/23 2123 11/15/23 0413 11/15/23 1349 11/15/23 2058  HGB 15.1* 16.0*  16.3*  --   --   --   --   --  15.0  --   --   HCT 46.5* 47.0*  48.0*  --   --   --   --   --  43.8  --   --   PLT 290  --   --   --   --   --   --  273  --   --   HEPARINUNFRC  --   --   --   --   --   --    < > 0.19* <0.10* <0.10*  CREATININE 0.89 0.80  --   --   --  0.75  --  0.75  --   --   TROPONINIHS 174*  --  411* 499* 495*  --   --   --   --   --    < > = values in this interval not displayed.    Estimated Creatinine Clearance: 46.5 mL/min (by C-G formula based on SCr of 0.75 mg/dL).   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  11/1 PM heparin level subtherapeutic at <0.10 on 750 units/hr. No signs of bleeding or pauses in infusion per RN.  Cardiology wants to treat medically with heparin for 48 hours (11/2 at 1430).   Goal of Therapy:  Heparin level 0.3-0.7 units/ml Monitor platelets by anticoagulation protocol: Yes   Plan:  Increase heparin to 850 units/hr Check heparin  level with AM labs. F/u DVT ultrasound  Continue to monitor H&H and platelets  Thank you for allowing pharmacy to be a part of this patient's care.  Larraine Brazier, PharmD Clinical Pharmacist 11/15/2023  9:40 PM **Pharmacist phone directory can now be found on [email or web address] ([email or web address]).  Listed under [facility].

## 2023-11-15 NOTE — Evaluation (Signed)
 Physical Therapy Evaluation Patient Details Name: Wendy [name] MRN: [id number] DOB: 1955-05-20 Today's Date: 11/15/2023  History of Present Illness  Pt is a 68 y.o. F who presents 11/14/2023 with sudden onset of shortness of breath.  The patient was noted to be tachycardic with heart rate in the 130s, hypoxic with O2 saturation of 83% on room air and severely hypertensive with SBP close to 200. Chest x-ray with hyperinflated lungs with mild pulmonary edema.  Venous blood gas with pH 7.158/61.5/66.  She was placed on BiPAP with improvement of her respiratory distress.  The patient received IV Lasix 40 mg x 1.  Troponin 174, 411.  EDP discussed the case with cardiology.  Cardiology team suspects the elevation of troponin is secondary to demand ischemia, recommended holding off on heparin drip for now. Significant PMH: rectal cancer status post resection, adjuvant therapy, and external radiation (in 2010), history of right internal jugular DVT (previously on Coumadin), hypertension, generalized anxiety disorder, current tobacco user.  Clinical Impression  Pt received supine HOB elevated and agreeable to PT session. PTA, pt states she was living alone, independent with ADLs, and did not walk with an AD. Subjectively reports having 10 falls in the last 6 months d/t LOB, denying dizziness, lightheadedness, or hearing changes. She also states she has had an inner ear infection for the past year, but has not sought medical care for this. Today, pt presents with slow and effortful speech and requires inc time to answer multiple choice questions. Pt supervision with supine>sit, and required repeated verbal cueing to not stand before therapist instructs to do so. Able to ambulate 150' with CGA and no AD today, and SpO2 remained above 90% throughout and pt denied SOB. During ambulation, pt experienced 3 LOB, 2 of which required minA to recover.  Noted L UE flexor tone and weakness, but did have residual grip strength.  Recommending HHPT to follow up to continue focusing on balance and functional mobility improvements to prevent further falls. Acute PT to follow.   Vitals at beginning of session:  75 BPM 93% SpO2 144/83 mmHg       If plan is discharge home, recommend the following: A little help with walking and/or transfers;A little help with bathing/dressing/bathroom;Assistance with cooking/housework;Direct supervision/assist for medications management;Direct supervision/assist for financial management;Assist for transportation;Help with stairs or ramp for entrance;Supervision due to cognitive status   Can travel by private vehicle        Equipment Recommendations None recommended by PT  Recommendations for Other Services       Functional Status Assessment Patient has not had a recent decline in their functional status     Precautions / Restrictions Precautions Precautions: Fall;Other (comment) Recall of Precautions/Restrictions: Impaired Restrictions Weight Bearing Restrictions Per Provider Order: No      Mobility  Bed Mobility Overal bed mobility: Needs Assistance Bed Mobility: Supine to Sit     Supine to sit: Supervision          Transfers Overall transfer level: Needs assistance Equipment used: None Transfers: Sit to/from Stand Sit to Stand: Contact guard assist           General transfer comment: No physical assist required for STS, just CGA for safety.    Ambulation/Gait Ambulation/Gait assistance: Contact guard assist, Min assist Gait Distance (Feet): 150 Feet Assistive device: None Gait Pattern/deviations: Step-through pattern, Decreased step length - right, Decreased step length - left, Staggering left Gait velocity: Dec Gait velocity interpretation: <1.31 ft/sec, indicative of household ambulator  General Gait Details: Pt with 3 LOB, two of which required minA to recover. Pt staggered to both L and R and stated she can't fully feel her L foot.  Stairs             Wheelchair Mobility     Tilt Bed    Modified Rankin (Stroke Patients Only)       Balance Overall balance assessment: Needs assistance Sitting-balance support: No upper extremity supported, Feet supported Sitting balance-Leahy Scale: Fair     Standing balance support: No upper extremity supported, During functional activity Standing balance-Leahy Scale: Fair                               Pertinent Vitals/Pain Pain Assessment Pain Assessment: No/denies pain    Home Living Family/patient expects to be discharged to:: Private residence Living Arrangements: Alone Available Help at Discharge: Family;Available PRN/intermittently (son lives next door; daughter lives in [place]) Type of Home: Apartment Home Access: Stairs to enter Entrance Stairs-Rails: None Entrance Stairs-Number of Steps: 3   Home Layout: One level Home Equipment: Agricultural [employer]ant (2 wheels);Cane - single point Additional Comments: Fallen 10 times in last 6 mo. Denies dizzines s or lightheadedness. States if feet are not on floor flat, she will fall. States she has had an inner ear infection for about a year, but she has not seen a provider about this. Denies hearing loss. States some ringing in ears, but this has been going on for many years.    Prior Function Prior Level of Function : Independent/Modified Independent;History of Falls (last six months)             Mobility Comments: Does not use AD at baseline. State RW would not fit in bathroom. States L hand does not work and has to use cane in R if she uses it. Pt reports 10 falls in past 6 months. ADLs Comments: Independent to Mod I for ADLs and light meal prep. Son assists with transportation and some home management tasks.     Extremity/Trunk Assessment   Upper Extremity Assessment Upper Extremity Assessment: Right hand dominant;Generalized weakness;RUE deficits/detail;LUE deficits/detail RUE Deficits / Details:  generalized weakness; mildly decreased fine motor coordination RUE Sensation: WNL RUE Coordination: decreased fine motor LUE Deficits / Details: generalized weakness with gross shoulder strength 2/5 and strength otherwise grossly 3/5; AROM shoulder flexion to approximately 80 degrees and PROM to approximately 95 degrees; elbow flextion mildly impaired, however, suspect due to IV placement; all other AROM WFL; decreased coordination; decreased proprioception LUE Sensation: decreased proprioception LUE Coordination: decreased fine motor;decreased gross motor    Lower Extremity Assessment Lower Extremity Assessment: Defer to PT evaluation RLE Deficits / Details: MMT hip flexion, knee extension, knee flexion, PF, DF : 4+/5 LLE Deficits / Details: MMT hip flexion, knee extension, knee flexion, PF, DF : 5/5 LLE Sensation:  (States she has dec sensation in L foot.)    Cervical / Trunk Assessment Cervical / Trunk Assessment: Normal  Communication   Communication Communication: No apparent difficulties Factors Affecting Communication: Difficulty expressing self    Cognition Arousal: Alert Behavior During Therapy: WFL for tasks assessed/performed   PT - Cognitive impairments: Attention, Safety/Judgement                       PT - Cognition Comments: Pt with slow and effortful speech. Required slightly inc time to answer questions. Following commands: Impaired Following commands  impaired: Only follows one step commands consistently, Follows multi-step commands inconsistently, Follows multi-step commands with increased time     Cueing Cueing Techniques: Verbal cues, Tactile cues, Visual cues     General Comments General comments (skin integrity, edema, etc.): Pt was handed off to OT to finish toileting. With PT, pt required minA to rip toilet paper and stated she was having issues with wiping. Demonstrates some L UE flexor tone and weakness, which she said started about a year  ago.    Exercises     Assessment/Plan    PT Assessment Patient needs continued PT services  PT Problem List Decreased strength;Decreased range of motion;Decreased activity tolerance;Decreased balance;Decreased mobility;Decreased cognition;Decreased knowledge of use of DME;Decreased safety awareness;Impaired sensation       PT Treatment Interventions Gait training;Functional mobility training;Therapeutic activities;Therapeutic exercise;Balance training;Neuromuscular re-education;Cognitive remediation;Patient/family education;Manual techniques    PT Goals (Current goals can be found in the Care Plan section)  Acute Rehab PT Goals Patient Stated Goal: to go home PT Goal Formulation: With patient Time For Goal Achievement: 11/29/23 Potential to Achieve Goals: Good    Frequency Min 2X/week     Co-evaluation               AM-PAC PT 6 Clicks Mobility  Outcome Measure Help needed turning from your back to your side while in a flat bed without using bedrails?: A Little Help needed moving from lying on your back to sitting on the side of a flat bed without using bedrails?: A Little Help needed moving to and from a bed to a chair (including a wheelchair)?: A Little Help needed standing up from a chair using your arms (e.g., wheelchair or bedside chair)?: A Little Help needed to walk in hospital room?: A Little Help needed climbing 3-5 steps with a railing? : A Little 6 Click Score: 18    End of Session Equipment Utilized During Treatment: Gait belt Activity Tolerance: Patient tolerated treatment well Patient left: Other (comment) (Pt left with OT while toileting.) Nurse Communication: Mobility status PT Visit Diagnosis: Unsteadiness on feet (R26.81);Other abnormalities of gait and mobility (R26.89);History of falling (Z91.81)    Time: 9083-9050 PT Time Calculation (min) (ACUTE ONLY): 33 min   Charges:   PT Evaluation $PT Eval Low Complexity: 1 Low PT  Treatments $Therapeutic Activity: 8-22 mins PT General Charges $$ ACUTE PT VISIT: 1 Visit         Muranda Coye, SPT   Ailey Wessling 11/15/2023, 1:08 PM

## 2023-11-16 DIAGNOSIS — R0603 Acute respiratory distress: Secondary | ICD-10-CM | POA: Diagnosis not present

## 2023-11-16 LAB — CBC
HCT: 39 % (ref 36.0–46.0)
Hemoglobin: 13.4 g/dL (ref 12.0–15.0)
MCH: 31.9 pg (ref 26.0–34.0)
MCHC: 34.4 g/dL (ref 30.0–36.0)
MCV: 92.9 fL (ref 80.0–100.0)
Platelets: 170 K/uL (ref 150–400)
RBC: 4.2 MIL/uL (ref 3.87–5.11)
RDW: 14.4 % (ref 11.5–15.5)
WBC: 5.9 K/uL (ref 4.0–10.5)
nRBC: 0 % (ref 0.0–0.2)

## 2023-11-16 LAB — GLUCOSE, CAPILLARY
Glucose-Capillary: 110 mg/dL — ABNORMAL HIGH (ref 70–99)
Glucose-Capillary: 96 mg/dL (ref 70–99)
Glucose-Capillary: 142 mg/dL — ABNORMAL HIGH (ref 70–99)
Glucose-Capillary: 99 mg/dL (ref 70–99)

## 2023-11-16 LAB — HEPARIN LEVEL (UNFRACTIONATED): Heparin Unfractionated: <0.1 [IU]/mL — ABNORMAL LOW (ref 0.30–0.70)

## 2023-11-16 LAB — BRAIN NATRIURETIC PEPTIDE: B Natriuretic Peptide: 497.7 pg/mL — ABNORMAL HIGH (ref 0.0–100.0)

## 2023-11-16 LAB — C-REACTIVE PROTEIN: CRP: 0.6 mg/dL (ref <1.0)

## 2023-11-16 LAB — PROCALCITONIN: Procalcitonin: <0.1 ng/mL

## 2023-11-16 MED ORDER — GUAIFENESIN-DM 100-10 MG/5ML PO SYRP: 5.0000 mL | ORAL_SOLUTION | ORAL | Status: DC | PRN

## 2023-11-16 MED ORDER — ACETAMINOPHEN 325 MG PO TABS
650.0000 mg | ORAL_TABLET | Freq: Four times a day (QID) | ORAL | Status: DC | PRN
  Administered 2023-11-16 – 2023-11-17 (×2): 650 mg via ORAL
  Filled 2023-11-16 (×2): qty 2

## 2023-11-16 MED ORDER — ENOXAPARIN SODIUM 30 MG/0.3ML IJ SOSY
30.0000 mg | PREFILLED_SYRINGE | INTRAMUSCULAR | Status: DC
  Administered 2023-11-16 – 2023-11-17 (×2): 30 mg via SUBCUTANEOUS
  Filled 2023-11-16 (×2): qty 0.3

## 2023-11-16 MED ORDER — ENOXAPARIN SODIUM 30 MG/0.3ML IJ SOSY: 30.0000 mg | PREFILLED_SYRINGE | INTRAMUSCULAR | Status: DC

## 2023-11-16 NOTE — Plan of Care (Signed)

## 2023-11-16 NOTE — Progress Notes (Signed)
 [facility] night cross cover note:   Prn acetaminophen  added for headache.    Eva Pore, DO Hospitalist

## 2023-11-16 NOTE — Progress Notes (Signed)
 PROGRESS NOTE                                                                                                                                                                                                             Patient Demographics:    Wendy [name], is a 68 y.o. female, DOB - 01-Jul-1955, FMW:[id number]  Outpatient Primary MD for the patient is System, Provider Not In    LOS - 2  Admit date - 11/14/2023    Chief Complaint  Patient presents with   Shortness of Breath       Brief Narrative (HPI from H&P)    68 y.o. female with medical history significant for rectal cancer status post resection, adjuvant therapy, and external radiation (in 2010), history of right internal jugular DVT (previously on Coumadin), hypertension, generalized anxiety disorder, current tobacco user, who presented to the ER due to sudden onset of shortness of breath.  The patient went outside of her home around 1 AM as she does habitually to smoke cigarettes.  After she returns in the house, she felt acutely short of breath and could not catch her breath.  EMS was activated.  The patient was noted to be tachycardic with heart rate in the 130s.  Hypoxic with O2 saturation of 83% on room air.  Not on O2 supplementation at baseline.  Has not seen a doctor in more than 5 years.  She is not on any prescribed medications.  She was admitted for acute hypoxic respiratory failure and admitted to the hospital.   Subjective:   Patient in bed, appears comfortable, denies any headache, no fever, no chest pain or pressure, no shortness of breath , no abdominal pain. No focal weakness.   Assessment  & Plan :   Cute on chronic hypoxic respiratory failure due to pulmonary edema caused by acute on chronic systolic CHF EF 45%, question Takotsubo cardiomyopathy versus nonspecific cardiomyopathy. Has uncontrolled hypertension at baseline and history of smoking.  No chest  pain, EKG has no acute changes, echo noted with EF 45% and wall motion abnormality, seen by cardiology, received Lasix with good improvement hypoxemia resolved, relatively symptom-free.  Clinically does not appear to have aspiration pneumonia, CTA negative, per cardiology on heparin drip, continue for another 24 hours however doubt this was ACS.  May require left heart cath will defer  this to cardiology.  CHF seems to have resolved initiate low-dose beta-blocker continue aspirin, since LDL is above goal will initiate Crestor as well, stable A1c, counseled to quit smoking.  If stable likely discharge home on 11/17/2023 with outpatient cardiology follow-up.   Hypertension, poorly controlled  Start low-dose Coreg and as needed hydralazine   Elevated D-dimer nonspecific.  Nonspecific, CTA and lower extremity venous ultrasound negative, transition to prophylactic Lovenox.     Generalized anxiety As needed anxiolytics   QTc prolongation Admission QTc 502 Avoid QTc prolonging agents Optimize magnesium and potassium levels. Monitor on telemetry   Hyperglycemia Presented with serum glucose of 229. Follow hemoglobin A1c  Lab Results  Component Value Date   HGBA1C 5.0 11/14/2023   CBG (last 3)  Recent Labs    11/15/23 1151 11/15/23 1612 11/15/23 2124  GLUCAP 128* 101* 134*        Condition - Extremely Guarded  Family Communication  :  None  Code Status :  Full  Consults  :  Cards  PUD Prophylaxis : PPI   Procedures  :     Leg US  - no DVT  CTA - 1. No evidence of pulmonary embolism. 2. Trace bilateral pleural effusions, right-greater-than-left width bilateral lower lobe dependent atelectasis/consolidation. Superimposed aspiration or pneumonia is not excluded. 3. Reflux of contrast material into the hepatic veins, suggesting a degree of right heart dysfunction. 4. Aortic Atherosclerosis (ICD10-I70.0) and Emphysema (ICD10-J43.9). Coronary artery calcifications.   TTE -  1. Left  ventricular ejection fraction, by estimation, is 45%. Left ventricular ejection fraction by 2D MOD biplane is 48.7 %. The left ventricle has mildly decreased function. The left ventricle demonstrates regional wall motion abnormalities (see scoring diagram/findings for description). The left ventricular internal cavity size was mildly dilated. Left ventricular diastolic parameters are consistent with Grade I diastolic dysfunction (impaired relaxation).  2. Right ventricular systolic function is normal. The right ventricular size is normal. There is normal pulmonary artery systolic pressure.  3. Left atrial size was mildly dilated.  4. A small pericardial effusion is present. There is no evidence of cardiac tamponade.  5. The mitral valve is normal in structure. Trivial mitral valve regurgitation. No evidence of mitral stenosis.  6. The aortic valve is calcified. Aortic valve regurgitation is moderate. Aortic valve sclerosis is present, with no evidence of aortic valve stenosis.  7. The inferior vena cava is normal in size with greater than 50% respiratory variability, suggesting right atrial pressure of 3 mmHg. Conclusion(s)/Recommendation(s): Mildly reduced LV systolic function = 45%. Regional wall motion abnormalities in an LAD distribution. Relative preservation of the basal contractility which can be seen in Takotsubo as well. Moderate AR.      Disposition Plan  :    Status is: Inpatient  DVT Prophylaxis  :  Loevnox    Lab Results  Component Value Date   PLT 170 11/16/2023    Diet :  Diet Order             Diet Heart Room service appropriate? Yes; Fluid consistency: Thin  Diet effective now                    Inpatient Medications  Scheduled Meds:  aspirin EC  81 mg Oral Daily   carvedilol  3.125 mg Oral BID WC   doxycycline  100 mg Oral Q12H   enoxaparin (LOVENOX) injection  30 mg Subcutaneous Q24H   insulin aspart  0-9 Units Subcutaneous TID WC   isosorbide  mononitrate  30  mg Oral Daily   pantoprazole  40 mg Oral Daily   rosuvastatin  10 mg Oral Daily   Continuous Infusions:   PRN Meds:.diphenhydrAMINE, guaiFENesin-dextromethorphan, hydrALAZINE, melatonin, polyethylene glycol, prochlorperazine  Antibiotics  :    Anti-infectives (From admission, onward)    Start     Dose/Rate Route Frequency Ordered Stop   11/15/23 1000  doxycycline (VIBRA-TABS) tablet 100 mg        100 mg Oral Every 12 hours 11/15/23 0904     11/14/23 1800  cefTRIAXone (ROCEPHIN) 2 g in sodium chloride  0.9 % 100 mL IVPB  Status:  Discontinued        2 g 200 mL/hr over 30 Minutes Intravenous Every 24 hours 11/14/23 1652 11/15/23 0903   11/14/23 1800  doxycycline (VIBRAMYCIN) 100 mg in sodium chloride  0.9 % 250 mL IVPB  Status:  Discontinued        100 mg 125 mL/hr over 120 Minutes Intravenous Every 12 hours 11/14/23 1652 11/15/23 0903         Objective:   Vitals:   11/15/23 1610 11/15/23 1929 11/16/23 0001 11/16/23 0300  BP: 117/70 125/82 (!) 118/90 126/72  Pulse: 86 78 (!) 59 (!) 58  Resp:  18 18 17   Temp:  98.3 F (36.8 C) 98.5 F (36.9 C) 98.3 F (36.8 C)  TempSrc:  Oral Oral Oral  SpO2:  92% 96% 95%  Weight:      Height:        Wt Readings from Last 3 Encounters:  11/14/23 42.6 kg  06/23/14 47.3 kg  06/08/14 45.8 kg     Intake/Output Summary (Last 24 hours) at 11/16/2023 0747 Last data filed at 11/15/2023 2141 Gross per 24 hour  Intake 860 ml  Output --  Net 860 ml     Physical Exam  Awake Alert, No new F.N deficits, Normal affect [place].AT,PERRAL Supple Neck, No JVD,   Symmetrical Chest wall movement, Good air movement bilaterally, CTAB RRR,No Gallops,Rubs or new Murmurs,  +ve B.Sounds, Abd Soft, No tenderness,   No Cyanosis, Clubbing or edema        Data Review:    Recent Labs  Lab 11/14/23 0210 11/14/23 0223 11/15/23 0413 11/16/23 0449  WBC 5.7  --  5.5 5.9  HGB 15.1* 16.0*  16.3* 15.0 13.4  HCT 46.5* 47.0*  48.0* 43.8 39.0  PLT 290   --  273 170  MCV 98.7  --  93.2 92.9  MCH 32.1  --  31.9 31.9  MCHC 32.5  --  34.2 34.4  RDW 14.6  --  14.5 14.4  LYMPHSABS 1.5  --   --   --   MONOABS 0.3  --   --   --   EOSABS 0.1  --   --   --   BASOSABS 0.1  --   --   --     Recent Labs  Lab 11/14/23 0210 11/14/23 0223 11/14/23 0420 11/14/23 0606 11/14/23 1343 11/15/23 0412 11/15/23 0413 11/16/23 0449  NA 137 139  139  --   --  136  --  136  --   K 3.9 3.8  3.8  --   --  3.7  --  4.2  --   CL 104 106  --   --  100  --  98  --   CO2 19*  --   --   --  19*  --  23  --   ANIONGAP  14  --   --   --  17*  --  15  --   GLUCOSE 229* 223*  --   --  75  --  91  --   BUN 8 8  --   --  8  --  14  --   CREATININE 0.89 0.80  --   --  0.75  --  0.75  --   AST 41  --   --   --   --   --   --   --   ALT 25  --   --   --   --   --   --   --   ALKPHOS 66  --   --   --   --   --   --   --   BILITOT 0.7  --   --   --   --   --   --   --   ALBUMIN 3.6  --   --   --   --   --   --   --   CRP  --   --   --  <0.5  --  0.6  --  0.6  DDIMER  --   --   --   --  1.67*  --   --   --   PROCALCITON  --   --   --  <0.10  --  <0.10  --  <0.10  LATICACIDVEN  --  3.6* 1.2  --   --   --   --   --   TSH  --   --   --   --   --   --  3.030  --   HGBA1C 5.0  --   --   --   --   --   --   --   BNP 1,097.8*  --   --   --   --  1,131.7*  --  497.7*  MG  --   --   --   --  1.4*  --  2.3  --   PHOS  --   --   --   --   --   --  3.9  --   CALCIUM 8.5*  --   --   --  8.3*  --  8.8*  --       Recent Labs  Lab 11/14/23 0210 11/14/23 0223 11/14/23 0420 11/14/23 0606 11/14/23 1343 11/15/23 0412 11/15/23 0413 11/16/23 0449  CRP  --   --   --  <0.5  --  0.6  --  0.6  DDIMER  --   --   --   --  1.67*  --   --   --   PROCALCITON  --   --   --  <0.10  --  <0.10  --  <0.10  LATICACIDVEN  --  3.6* 1.2  --   --   --   --   --   TSH  --   --   --   --   --   --  3.030  --   HGBA1C 5.0  --   --   --   --   --   --   --   BNP 1,097.8*  --   --   --   --   1,131.7*  --  497.7*  MG  --   --   --   --  1.4*  --  2.3  --   CALCIUM 8.5*  --   --   --  8.3*  --  8.8*  --     --------------------------------------------------------------------------------------------------------------- Lab Results  Component Value Date   CHOL 168 11/15/2023   HDL 71 11/15/2023   LDLCALC 82 11/15/2023   TRIG 73 11/15/2023   CHOLHDL 2.4 11/15/2023    Lab Results  Component Value Date   HGBA1C 5.0 11/14/2023   Recent Labs    11/15/23 0413  TSH 3.030   No results for input(s): VITAMINB12, FOLATE, FERRITIN, TIBC, IRON, RETICCTPCT in the last 72 hours. ------------------------------------------------------------------------------------------------------------------ Cardiac Enzymes No results for input(s): CKMB, TROPONINI, MYOGLOBIN in the last 168 hours.  Invalid input(s): CK  Micro Results Recent Results (from the past 240 hours)  Resp panel by RT-PCR (RSV, Flu A&B, Covid) Anterior Nasal Swab     Status: None   Collection Time: 11/14/23  2:10 AM   Specimen: Anterior Nasal Swab  Result Value Ref Range Status   SARS Coronavirus 2 by RT PCR NEGATIVE NEGATIVE Final   Influenza A by PCR NEGATIVE NEGATIVE Final   Influenza B by PCR NEGATIVE NEGATIVE Final    Comment: (NOTE) The Xpert Xpress SARS-CoV-2/FLU/RSV plus assay is intended as an aid in the diagnosis of influenza from Nasopharyngeal swab specimens and should not be used as a sole basis for treatment. Nasal washings and aspirates are unacceptable for Xpert Xpress SARS-CoV-2/FLU/RSV testing.  Fact Sheet for Patients: bloggercourse.com  Fact Sheet for Healthcare Providers: seriousbroker.it  This test is not yet approved or cleared by the [place]  FDA and has been authorized for detection and/or diagnosis of SARS-CoV-2 by FDA under an Emergency Use Authorization (EUA). This EUA will remain in effect (meaning this test  can be used) for the duration of the COVID-19 declaration under Section 564(b)(1) of the Act, 21 U.S.C. section 360bbb-3(b)(1), unless the authorization is terminated or revoked.     Resp Syncytial Virus by PCR NEGATIVE NEGATIVE Final    Comment: (NOTE) Fact Sheet for Patients: bloggercourse.com  Fact Sheet for Healthcare Providers: seriousbroker.it  This test is not yet approved or cleared by the [place]  FDA and has been authorized for detection and/or diagnosis of SARS-CoV-2 by FDA under an Emergency Use Authorization (EUA). This EUA will remain in effect (meaning this test can be used) for the duration of the COVID-19 declaration under Section 564(b)(1) of the Act, 21 U.S.C. section 360bbb-3(b)(1), unless the authorization is terminated or revoked.  Performed at [facility], [address], [place], [place] [address]   MRSA Next Gen by PCR, Nasal     Status: None   Collection Time: 11/14/23  4:51 PM   Specimen: Nasal Mucosa; Nasal Swab  Result Value Ref Range Status   MRSA by PCR Next Gen NOT DETECTED NOT DETECTED Final    Comment: (NOTE) The GeneXpert MRSA Assay (FDA approved for NASAL specimens only), is one component of a comprehensive MRSA colonization surveillance program. It is not intended to diagnose MRSA infection nor to guide or monitor treatment for MRSA infections. Test performance is not FDA approved in patients less than 98 years old. Performed at [facility], [address], [place], [place] [address]     Radiology Report VAS US  LOWER EXTREMITY VENOUS (DVT) Result Date: 11/15/2023  Lower Venous DVT Study Patient Name:  FOTINI LEMUS  Date of Exam:   11/15/2023 Medical Rec #: [id number]  Accession #:    [id number] Date of Birth: July 26, 1955        Patient Gender: F Patient Age:   43 years Exam Location:  [facility] Procedure:      VAS US  LOWER EXTREMITY VENOUS (DVT) Referring  Phys: LAVADA [facility] --------------------------------------------------------------------------------  Indications: SOB, and Elevated D-dimer.  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 Comparison Study: No prior study on file Performing Technologist: Alberta Lis RVS  Examination Guidelines: A complete evaluation includes B-mode imaging, spectral Doppler, color Doppler, and power Doppler as needed of all accessible portions of each vessel. Bilateral testing is considered an integral part of a complete examination. Limited examinations for reoccurring indications may be performed as noted. The reflux portion of the exam is performed with the patient in reverse Trendelenburg.  +---------+---------------+---------+-----------+----------+--------------+ RIGHT    CompressibilityPhasicitySpontaneityPropertiesThrombus Aging +---------+---------------+---------+-----------+----------+--------------+ CFV      Full           Yes      No                                  +---------+---------------+---------+-----------+----------+--------------+ SFJ      Full                                                        +---------+---------------+---------+-----------+----------+--------------+ FV Prox  Full                                                        +---------+---------------+---------+-----------+----------+--------------+ FV Mid   Full                                                        +---------+---------------+---------+-----------+----------+--------------+ FV DistalFull                                                        +---------+---------------+---------+-----------+----------+--------------+ PFV      Full                                                        +---------+---------------+---------+-----------+----------+--------------+ POP      Full           Yes      Yes                                  +---------+---------------+---------+-----------+----------+--------------+ PTV      Full                                                        +---------+---------------+---------+-----------+----------+--------------+  PERO     Full                                                        +---------+---------------+---------+-----------+----------+--------------+ Gastroc  Full                                                        +---------+---------------+---------+-----------+----------+--------------+   +---------+---------------+---------+-----------+----------+--------------+ LEFT     CompressibilityPhasicitySpontaneityPropertiesThrombus Aging +---------+---------------+---------+-----------+----------+--------------+ CFV      Full           Yes      Yes                                 +---------+---------------+---------+-----------+----------+--------------+ SFJ      Full                                                        +---------+---------------+---------+-----------+----------+--------------+ FV Prox  Full                                                        +---------+---------------+---------+-----------+----------+--------------+ FV Mid   Full                                                        +---------+---------------+---------+-----------+----------+--------------+ FV DistalFull                                                        +---------+---------------+---------+-----------+----------+--------------+ PFV      Full                                                        +---------+---------------+---------+-----------+----------+--------------+ POP      Full           Yes      Yes                                 +---------+---------------+---------+-----------+----------+--------------+ PTV      Full                                                         +---------+---------------+---------+-----------+----------+--------------+  PERO     Full                                                        +---------+---------------+---------+-----------+----------+--------------+ Gastroc  Full                                                        +---------+---------------+---------+-----------+----------+--------------+    Summary: RIGHT: - There is no evidence of deep vein thrombosis in the lower extremity.  - No cystic structure found in the popliteal fossa. Interstitial edema noted throughout  LEFT: - There is no evidence of deep vein thrombosis in the lower extremity.  - No cystic structure found in the popliteal fossa. Interstitial edema noted throughout.  *See table(s) above for measurements and observations.    Preliminary    DG Chest Port 1 View Result Date: 11/15/2023 CLINICAL DATA:  Shortness of breath. EXAM: PORTABLE CHEST 1 VIEW COMPARISON:  11/14/2023 FINDINGS: The heart size and mediastinal contours are within normal limits. Mildly increased opacity in right lung base, suspicious for developing infiltrate. No pleural effusion or pneumothorax seen. IMPRESSION: Mildly increased opacity in right lung base, suspicious for developing infiltrate/pneumonia. COPD. Electronically Signed   By: Norleen DELENA Kil M.D.   On: 11/15/2023 06:08   CT Angio Chest Pulmonary Embolism (PE) W or WO Contrast Result Date: 11/14/2023 CLINICAL DATA:  Acute onset shortness of breath and tachycardia EXAM: CT ANGIOGRAPHY CHEST WITH CONTRAST TECHNIQUE: Multidetector CT imaging of the chest was performed using the standard protocol during bolus administration of intravenous contrast. Multiplanar CT image reconstructions and MIPs were obtained to evaluate the vascular anatomy. RADIATION DOSE REDUCTION: This exam was performed according to the departmental dose-optimization program which includes automated exposure control, adjustment of the mA and/or kV according to patient  size and/or use of iterative reconstruction technique. CONTRAST:  75mL OMNIPAQUE  IOHEXOL  350 MG/ML SOLN COMPARISON:  Same day chest radiograph, CT chest dated 01/31/2011 FINDINGS: Cardiovascular: The study is high quality for the evaluation of pulmonary embolism. There are no filling defects in the central, lobar, segmental or subsegmental pulmonary artery branches to suggest acute pulmonary embolism. Great vessels are normal in course and caliber. Normal heart size. No significant pericardial fluid/thickening. Coronary artery calcifications and aortic atherosclerosis. Reflux of contrast material into the hepatic veins, suggesting a degree of right heart dysfunction. Mediastinum/Nodes: Imaged thyroid gland without nodules meeting criteria for imaging follow-up by size. Normal esophagus. No pathologically enlarged axillary, supraclavicular, mediastinal, or hilar lymph nodes. 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 Lungs/Pleura: The central airways are patent. Trace layering secretions in the trachea extending into bilateral bronchi. Moderate centrilobular and paraseptal emphysema. Right apical 6 x 3 mm nodule (7:7), unchanged from 01/31/2011, likely benign, favored scarring. Slightly irregular 5 x 2 mm perifissural left upper lobe nodule (7:49), likely perifissural lymph node. Bilateral lower lobe dependent atelectasis/consolidation. No pneumothorax. Trace bilateral pleural effusions, right-greater-than-left. Upper abdomen: Normal. Musculoskeletal: No acute or abnormal lytic or blastic osseous lesions. Review of the MIP images confirms the above findings.  IMPRESSION: 1. No evidence of pulmonary embolism. 2. Trace bilateral pleural effusions, right-greater-than-left width bilateral lower lobe dependent atelectasis/consolidation. Superimposed  aspiration or pneumonia is not excluded. 3. Reflux of contrast material into the hepatic veins, suggesting a degree of right heart dysfunction. 4. Aortic Atherosclerosis (ICD10-I70.0) and Emphysema (ICD10-J43.9). Coronary artery calcifications. Assessment for potential risk factor modification, dietary therapy or pharmacologic therapy may be warranted, if clinically indicated. Electronically Signed   By: Limin  Xu M.D.   On: 11/14/2023 16:39   ECHOCARDIOGRAM COMPLETE Result Date: 11/14/2023    ECHOCARDIOGRAM REPORT   Patient Name:   SHANAIYA BENE Date of Exam: 11/14/2023 Medical Rec #:  [id number]       Height:       62.5 in Accession #:    [id number]      Weight:       104.2 lb Date of Birth:  October 08, 1955       BSA:          1.458 m Patient Age:    68 years        BP:           111/74 mmHg Patient Gender: F               HR:           74 bpm. Exam Location:  Inpatient Procedure: 2D Echo and Intracardiac Opacification Agent (Both Spectral and Color            Flow Doppler were utilized during procedure). Indications:    Chest pain  History:        Patient has no prior history of Echocardiogram examinations.  Sonographer:    Charmaine Gaskins Referring Phys: [id number] [employer] IMPRESSIONS  1. Left ventricular ejection fraction, by estimation, is 45%. Left ventricular ejection fraction by 2D MOD biplane is 48.7 %. The left ventricle has mildly decreased function. The left ventricle demonstrates regional wall motion abnormalities (see scoring diagram/findings for description). The left ventricular internal cavity size was mildly dilated. Left ventricular diastolic parameters are consistent with Grade I diastolic dysfunction (impaired relaxation).  2. Right ventricular systolic function is normal. The right ventricular size is normal. There is normal pulmonary artery systolic pressure.  3. Left atrial size was mildly dilated.  4. A small pericardial effusion is present. There is no evidence of cardiac tamponade.   5. The mitral valve is normal in structure. Trivial mitral valve regurgitation. No evidence of mitral stenosis.  6. The aortic valve is calcified. Aortic valve regurgitation is moderate. Aortic valve sclerosis is present, with no evidence of aortic valve stenosis.  7. The inferior vena cava is normal in size with greater than 50% respiratory variability, suggesting right atrial pressure of 3 mmHg. Conclusion(s)/Recommendation(s): Mildly reduced LV systolic function = 45%. Regional wall motion abnormalities in an LAD distribution. Relative preservation of the basal contractility which can be seen in Takotsubo as well. Moderate AR. Recommend cardiology consultation for further evaluation. FINDINGS  Left Ventricle: Left ventricular ejection fraction, by estimation, is 45%. Left ventricular ejection fraction by 2D MOD biplane is 48.7 %. The left ventricle has mildly decreased function. The left ventricle demonstrates regional wall motion abnormalities. Definity contrast agent was given IV to delineate the left ventricular endocardial borders. The left ventricular internal cavity size was mildly dilated. There is no left ventricular hypertrophy. Left ventricular diastolic parameters are consistent with Grade I diastolic dysfunction (impaired relaxation).  LV Wall Scoring: The apical lateral segment, mid inferoseptal segment, apical septal  segment, and apex are akinetic. The antero-lateral wall and basal inferoseptal segment are normal. Right Ventricle: The right ventricular size is normal. No increase in right ventricular wall thickness. Right ventricular systolic function is normal. There is normal pulmonary artery systolic pressure. The tricuspid regurgitant velocity is 2.54 m/s, and  with an assumed right atrial pressure of 3 mmHg, the estimated right ventricular systolic pressure is 28.8 mmHg. Left Atrium: Left atrial size was mildly dilated. Right Atrium: Right atrial size was normal in size. Pericardium: A small  pericardial effusion is present. There is no evidence of cardiac tamponade. Mitral Valve: The mitral valve is normal in structure. Trivial mitral valve regurgitation. No evidence of mitral valve stenosis. Tricuspid Valve: The tricuspid valve is normal in structure. Tricuspid valve regurgitation is mild . No evidence of tricuspid stenosis. Aortic Valve: The aortic valve is calcified. Aortic valve regurgitation is moderate. Aortic valve sclerosis is present, with no evidence of aortic valve stenosis. Pulmonic Valve: The pulmonic valve was normal in structure. Pulmonic valve regurgitation is not visualized. No evidence of pulmonic stenosis. Aorta: The aortic root and ascending aorta are structurally normal, with no evidence of dilitation. Venous: The inferior vena cava is normal in size with greater than 50% respiratory variability, suggesting right atrial pressure of 3 mmHg. IAS/Shunts: No atrial level shunt detected by color flow Doppler.  LEFT VENTRICLE PLAX 2D                        Biplane EF (MOD) LVIDd:         4.90 cm         LV Biplane EF:   Left LVIDs:         2.20 cm                          ventricular LV PW:         1.00 cm                          ejection LV IVS:        1.00 cm                          fraction by LVOT diam:     2.10 cm                          2D MOD LVOT Area:     3.46 cm                         biplane is                                                 48.7 %.  LV Volumes (MOD)               Diastology LV vol d, MOD    82.0 ml       LV e' medial:    5.33 cm/s A2C:                           LV E/e' medial:  8.3 LV vol d, MOD  101.0 ml      LV e' lateral:   3.59 cm/s A4C:                           LV E/e' lateral: 12.4 LV vol s, MOD    42.4 ml A2C: LV vol s, MOD    50.6 ml A4C: LV SV MOD A2C:   39.6 ml LV SV MOD A4C:   101.0 ml LV SV MOD BP:    44.7 ml RIGHT VENTRICLE RV Basal diam:  3.10 cm RV Mid diam:    2.60 cm RV S prime:     24.90 cm/s RVSP:           28.8 mmHg LEFT ATRIUM              Index        RIGHT ATRIUM           Index LA diam:        3.10 cm 2.13 cm/m   RA Pressure: 3.00 mmHg LA Vol (A2C):   66.8 ml 45.83 ml/m  RA Area:     12.90 cm LA Vol (A4C):   61.8 ml 42.40 ml/m  RA Volume:   30.20 ml  20.72 ml/m LA Biplane Vol: 65.4 ml 44.87 ml/m   AORTA Ao Root diam: 2.80 cm Ao Asc diam:  2.80 cm MITRAL VALVE               TRICUSPID VALVE MV Area (PHT): 4.31 cm    TR Peak grad:   25.8 mmHg MV Decel Time: 176 msec    TR Vmax:        254.00 cm/s MV E velocity: 44.50 cm/s  Estimated RAP:  3.00 mmHg MV A velocity: 73.90 cm/s  RVSP:           28.8 mmHg MV E/A ratio:  0.60                            SHUNTS                            Systemic Diam: 2.10 cm Georganna Archer Electronically signed by Georganna Archer Signature Date/Time: 11/14/2023/11:18:14 AM    Final      Signature  -   Lavada Stank M.D on 11/16/2023 at 7:47 AM   -  To page go to [email or web address]

## 2023-11-16 NOTE — Plan of Care (Signed)
 Wendy [name]

## 2023-11-16 NOTE — Progress Notes (Signed)
 PHARMACY - ANTICOAGULATION CONSULT NOTE  Pharmacy Consult for enoxaparin (switching from heparin for ACS) Indication: VTE prophylaxis  Allergies  Allergen Reactions   Amoxicillin Rash   Vancomycin Itching    Erythema and mild itching -  Resolved with IV Benadryl.   Erythromycin Base Hives and Other (See Comments)    Pt states it makes her shaky.    Patient Measurements: Height: 5' 2 (157.5 cm) Weight: 42.6 kg (94 lb) IBW/kg (Calculated) : 50.1 HEPARIN DW (KG): 42.6  Vital Signs: Temp: 98.3 F (36.8 C) (11/02 0300) Temp Source: Oral (11/02 0300) BP: 126/72 (11/02 0300) Pulse Rate: 58 (11/02 0300)  Labs: Recent Labs    11/14/23 0210 11/14/23 0223 11/14/23 0416 11/14/23 0730 11/14/23 0829 11/14/23 1343 11/14/23 2123 11/15/23 0413 11/15/23 1349 11/15/23 2058 11/16/23 0449  HGB 15.1* 16.0*  16.3*  --   --   --   --   --  15.0  --   --  13.4  HCT 46.5* 47.0*  48.0*  --   --   --   --   --  43.8  --   --  39.0  PLT 290  --   --   --   --   --   --  273  --   --  170  HEPARINUNFRC  --   --   --   --   --   --    < > 0.19* <0.10* <0.10* <0.10*  CREATININE 0.89 0.80  --   --   --  0.75  --  0.75  --   --   --   TROPONINIHS 174*  --  411* 499* 495*  --   --   --   --   --   --    < > = values in this interval not displayed.    Estimated Creatinine Clearance: 46.5 mL/min (by C-G formula based on SCr of 0.75 mg/dL).   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 Now determined to be demand ischemia in setting of COPD. Pharmacy consulted to start heparin for medical management.   11/2: heparin level consistently subtherapeutic at <0.10 after increasing to 850 units/hr. Hgb, PLT WNL. No signs of bleeding on patient assessment.  Cardiology wanted to treat medically with heparin  for 48 hours, ending today, 11/2. Will stop heparin and start enoxaparin for VTE prophylaxis.   Goal of Therapy:  Heparin level 0.3-0.7 units/ml Monitor platelets by anticoagulation protocol: Yes   Plan:  Stop heparin infusion Start enoxaparin 30 mg SQ daily Continue to monitor H&H and platelets  Thank you for allowing pharmacy to be a part of this patient's care.  Izetta Carl, PharmD PGY1 Pharmacy Resident

## 2023-11-17 ENCOUNTER — Other Ambulatory Visit (HOSPITAL_COMMUNITY): Payer: Self-pay

## 2023-11-17 DIAGNOSIS — R0603 Acute respiratory distress: Secondary | ICD-10-CM | POA: Diagnosis not present

## 2023-11-17 LAB — CBC
HCT: 40 % (ref 36.0–46.0)
Hemoglobin: 13.8 g/dL (ref 12.0–15.0)
MCH: 31.7 pg (ref 26.0–34.0)
MCHC: 34.5 g/dL (ref 30.0–36.0)
MCV: 91.7 fL (ref 80.0–100.0)
Platelets: 275 K/uL (ref 150–400)
RBC: 4.36 MIL/uL (ref 3.87–5.11)
RDW: 14.3 % (ref 11.5–15.5)
WBC: 4.3 K/uL (ref 4.0–10.5)
nRBC: 0 % (ref 0.0–0.2)

## 2023-11-17 LAB — C-REACTIVE PROTEIN: CRP: 0.9 mg/dL (ref <1.0)

## 2023-11-17 LAB — PROCALCITONIN: Procalcitonin: <0.1 ng/mL

## 2023-11-17 LAB — BRAIN NATRIURETIC PEPTIDE: B Natriuretic Peptide: 366.5 pg/mL — ABNORMAL HIGH (ref 0.0–100.0)

## 2023-11-17 MED ORDER — LISINOPRIL 5 MG PO TABS
5.0000 mg | ORAL_TABLET | Freq: Every day | ORAL | Status: DC
  Administered 2023-11-17: 5 mg via ORAL
  Filled 2023-11-17: qty 1

## 2023-11-17 MED ORDER — ASPIRIN 81 MG PO TBEC
81.0000 mg | DELAYED_RELEASE_TABLET | Freq: Every day | ORAL | 0 refills | Status: AC
  Filled 2023-11-17: qty 30, 30d supply, fill #0

## 2023-11-17 MED ORDER — ALBUTEROL SULFATE HFA 108 (90 BASE) MCG/ACT IN AERS
2.0000 | INHALATION_SPRAY | Freq: Four times a day (QID) | RESPIRATORY_TRACT | 2 refills | Status: AC | PRN
  Filled 2023-11-17: qty 6.7, 25d supply, fill #0

## 2023-11-17 MED ORDER — LISINOPRIL 5 MG PO TABS
5.0000 mg | ORAL_TABLET | Freq: Every day | ORAL | 0 refills | Status: DC
  Filled 2023-11-17: qty 30, 30d supply, fill #0

## 2023-11-17 MED ORDER — DOXYCYCLINE HYCLATE 100 MG PO TABS
100.0000 mg | ORAL_TABLET | Freq: Two times a day (BID) | ORAL | 0 refills | Status: DC
  Filled 2023-11-17: qty 4, 2d supply, fill #0

## 2023-11-17 MED ORDER — CARVEDILOL 3.125 MG PO TABS
3.1250 mg | ORAL_TABLET | Freq: Two times a day (BID) | ORAL | 0 refills | Status: DC
  Filled 2023-11-17: qty 60, 30d supply, fill #0

## 2023-11-17 MED ORDER — ISOSORBIDE MONONITRATE ER 30 MG PO TB24
30.0000 mg | ORAL_TABLET | Freq: Every day | ORAL | 0 refills | Status: DC
  Filled 2023-11-17: qty 30, 30d supply, fill #0

## 2023-11-17 MED ORDER — FUROSEMIDE 40 MG PO TABS
20.0000 mg | ORAL_TABLET | Freq: Every day | ORAL | 0 refills | Status: DC | PRN
  Filled 2023-11-17: qty 10, 20d supply, fill #0

## 2023-11-17 MED ORDER — PANTOPRAZOLE SODIUM 40 MG PO TBEC
40.0000 mg | DELAYED_RELEASE_TABLET | Freq: Every day | ORAL | 0 refills | Status: DC
  Filled 2023-11-17: qty 30, 30d supply, fill #0

## 2023-11-17 MED ORDER — ROSUVASTATIN CALCIUM 10 MG PO TABS
10.0000 mg | ORAL_TABLET | Freq: Every day | ORAL | 0 refills | Status: DC
  Filled 2023-11-17: qty 30, 30d supply, fill #0

## 2023-11-17 NOTE — TOC Transition Note (Signed)
 Transition of Care [facility]) - Discharge Note   Patient Details  Name: Wendy [name] MRN: [id number] Date of Birth: [birth year]  Transition of Care [facility] CM/SW Contact:  Roxie KANDICE Stain, RN Phone Number: 11/17/2023, 8:29 AM   Clinical Narrative:    Patient stable for discharge.  Notified Lynette with [facility] of discharge.  No other ICM (Inpatient Care Management) needs at this time.  Final next level of care: Home w Home Health Services Barriers to Discharge: Barriers Resolved   Patient Goals and CMS Choice Patient states their goals for this hospitalization and ongoing recovery are:: to return home CMS Medicare.gov Compare Post Acute Care list provided to:: Patient Choice offered to / list presented to : Patient      Discharge Placement                   Home    Discharge Plan and Services Additional resources added to the After Visit Summary for     Discharge Planning Services: CM Consult Post Acute Care Choice: Home Health, Durable Medical Equipment          DME Arranged: Bedside commode DME Agency: [employer] Date DME Agency Contacted: 11/15/23 Time DME Agency Contacted: [phone] Representative spoke with at DME Agency: [place] HH Arranged: PT, OT HH Agency: [facility] Date HH Agency Contacted: 11/15/23 Time HH Agency Contacted: [phone] Representative spoke with at [facility] Agency: [place]  Social Drivers of Health (SDOH) Interventions SDOH Screenings   Food Insecurity: Food Insecurity Present (11/14/2023)  Housing: Low Risk  (11/14/2023)  Transportation Needs: No Transportation Needs (11/14/2023)  Utilities: Not At Risk (11/14/2023)  Social Connections: Socially Isolated (11/14/2023)  Tobacco Use: High Risk (11/14/2023)     Readmission Risk Interventions    11/17/2023    8:29 AM  Readmission Risk Prevention Plan  Post Dischage Appt Complete  Medication Screening Complete  Transportation Screening Complete

## 2023-11-17 NOTE — Discharge Summary (Addendum)
 Wendy [name] FMW:[id number] DOB: [birth year] DOA: 11/14/2023  PCP: System, Provider Not In  Admit date: 11/14/2023  Discharge date: 11/17/2023  Admitted From: Home   Disposition:  Home   Recommendations for Outpatient Follow-up:   Follow up with PCP in 1-2 weeks  PCP Please obtain BMP/CBC, 2 view CXR in 1week,  (see Discharge instructions)   PCP Please follow up on the following pending results: Needs outpatient cardiology follow-up in 2 to 3 weeks   Home Health: None Equipment/Devices: None Consultations: Neurology Discharge Condition: Stable    CODE STATUS: Full    Diet Recommendation: Heart Healthy - 1.5 L fluid restriction per day    Chief Complaint  Patient presents with   Shortness of Breath     Brief history of present illness from the day of admission and additional interim summary     68 y.o. female with medical history significant for rectal cancer status post resection, adjuvant therapy, and external radiation (in 2010), history of right internal jugular DVT (previously on Coumadin), hypertension, generalized anxiety disorder, current tobacco user, who presented to the ER due to sudden onset of shortness of breath.  The patient went outside of her home around 1 AM as she does habitually to smoke cigarettes.  After she returns in the house, she felt acutely short of breath and could not catch her breath.  EMS was activated.  The patient was noted to be tachycardic with heart rate in the 130s.  Hypoxic with O2 saturation of 83% on room air.  Not on O2 supplementation at baseline.  Has not seen a doctor in more than 5 years.  She is not on any prescribed medications.  She was admitted for acute hypoxic respiratory failure and admitted to the hospital.                                                                   Hospital Course   Acute on chronic hypoxic respiratory failure due to pulmonary edema caused by acute on chronic systolic CHF EF 45%, question Takotsubo cardiomyopathy versus nonspecific cardiomyopathy. Has uncontrolled hypertension at baseline and history of smoking.  No chest pain, EKG has no acute changes, echo noted with EF 45% and wall motion abnormality, seen by cardiology, received Lasix with good improvement hypoxemia resolved, relatively symptom-free.  Clinically does not appear to have aspiration pneumonia, CTA negative, per cardiology on heparin drip, continue for another 24 hours however doubt this was ACS.  May require left heart cath will defer this to cardiology.  CHF seems to have resolved initiate low-dose beta-blocker, ACE inhibitor, aspirin, since LDL is above goal will initiate Crestor as well, stable A1c, counseled to quit smoking.  He is now symptom-free will be discharged home on above dictated medications along with as needed Lasix,  requested to follow fluid restriction with written instructions.  Postdischarge follow-up with PCP and cardiology.  Case was discussed with cardiologist Dr. Jeffrie on 11/16/2023, he will follow the patient in the office.   Hypertension, poorly controlled  Fully controlled blood pressure placed on Coreg, Imdur and lisinopril  combination PCP to monitor and adjust   Elevated D-dimer nonspecific.  Nonspecific, CTA and lower extremity venous ultrasound negative, t he was here on prophylactic Lovenox.      Generalized anxiety No acute issues   QTc prolongation Improved after abortive care avoid QTc prolonging medications outpatient   Hyperglycemia  Present related stable A1c PCP tomorrow Discharge diagnosis     Principal Problem:   Respiratory distress Active Problems:   QT prolongation    Discharge instructions    Discharge Instructions     Discharge instructions   Complete by: As directed    Follow with Primary MD System,  Provider Not In in 7 days   Get CBC, CMP, Magnesium, 2 view Chest X ray -  checked next visit with your primary MD   Activity: As tolerated with Full fall precautions use walker/cane & assistance as needed  Disposition Home    Diet: Heart healthy diet, 1.5 L fluid restriction per day - Check your Weight same time everyday, if you gain over 2 pounds, or you develop in leg swelling, experience more shortness of breath or chest pain, call your Primary MD immediately. Follow Cardiac Low Salt Diet and 1.5 lit/day fluid restriction.  Special Instructions: If you have smoked or chewed Tobacco  in the last 2 yrs please stop smoking, stop any regular Alcohol  and or any Recreational drug use.  On your next visit with your primary care physician please Get Medicines reviewed and adjusted.  Please request your Prim.MD to go over all Hospital Tests and Procedure/Radiological results at the follow up, please get all Hospital records sent to your Prim MD by signing hospital release before you go home.  If you experience worsening of your admission symptoms, develop shortness of breath, life threatening emergency, suicidal or homicidal thoughts you must seek medical attention immediately by calling 911 or calling your MD immediately  if symptoms less severe.  You Must read complete instructions/literature along with all the possible adverse reactions/side effects for all the Medicines you take and that have been prescribed to you. Take any new Medicines after you have completely understood and accpet all the possible adverse reactions/side effects.   Do not drive when taking Pain medications.  Do not take more than prescribed Pain, Sleep and Anxiety Medications  Wear Seat belts while driving.   Increase activity slowly   Complete by: As directed        Discharge Medications   Allergies as of 11/17/2023       Reactions   Amoxicillin Rash   Vancomycin Itching   Erythema and mild itching -  Resolved  with IV Benadryl.   Erythromycin Base Hives, Other (See Comments)   Pt states it makes her shaky.        Medication List     STOP taking these medications    ibuprofen  200 MG tablet Commonly known as: ADVIL        TAKE these medications    acetaminophen  325 MG tablet Commonly known as: TYLENOL  Take 650 mg by mouth every 6 (six) hours as needed for mild pain (pain score 1-3) or headache.   albuterol 108 (90 Base) MCG/ACT inhaler Commonly known as: VENTOLIN HFA Inhale  2 puffs into the lungs every 6 (six) hours as needed for wheezing or shortness of breath.   aspirin EC 81 MG tablet Take 1 tablet (81 mg total) by mouth daily. Swallow whole.   carvedilol 3.125 MG tablet Commonly known as: COREG Take 1 tablet (3.125 mg total) by mouth 2 (two) times daily with a meal.   doxycycline 100 MG tablet Commonly known as: VIBRA-TABS Take 1 tablet (100 mg total) by mouth every 12 (twelve) hours.   furosemide 40 MG tablet Commonly known as: Lasix Take 0.5 tablets (20 mg total) by mouth daily as needed for fluid or edema.   isosorbide mononitrate 30 MG 24 hr tablet Commonly known as: IMDUR Take 1 tablet (30 mg total) by mouth daily.   lisinopril  5 MG tablet Commonly known as: ZESTRIL  Take 1 tablet (5 mg total) by mouth daily.   pantoprazole 40 MG tablet Commonly known as: PROTONIX Take 1 tablet (40 mg total) by mouth daily.   rosuvastatin 10 MG tablet Commonly known as: CRESTOR Take 1 tablet (10 mg total) by mouth daily.               Durable Medical Equipment  (From admission, onward)           Start     Ordered   11/15/23 1444  For home use only DME Bedside commode  Once       Question:  Patient needs a bedside commode to treat with the following condition  Answer:  Weakness   11/15/23 1443             Follow-up Information     [facility] Follow up.   Specialty: Home Health Services Why: for home health  services Contact information: [address] [place] [address] [place] [address] [phone]         Jeffrie Oneil BROCKS, MD. Schedule an appointment as soon as possible for a visit in 2 week(s).   Specialty: Cardiology Contact information: [address] [place] [address] [phone]                 Major procedures and Radiology Reports - PLEASE review detailed and final reports thoroughly  -      VAS US  LOWER EXTREMITY VENOUS (DVT) Result Date: 11/16/2023  Lower Venous DVT Study Patient Name:  Wendy [name]  Date of Exam:   11/15/2023 Medical Rec #: [id number]        Accession #:    [id number] Date of Birth: [birth year]        Patient Gender: F Patient Age:   82 years Exam Location:  [facility] Procedure:      VAS US  LOWER EXTREMITY VENOUS (DVT) Referring Phys: [facility] --------------------------------------------------------------------------------  Indications: SOB, and Elevated D-dimer.  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 Comparison Study: No prior study on file Performing Technologist: Wendy Lis RVS  Examination Guidelines: A complete evaluation includes B-mode imaging, spectral Doppler, color Doppler, and power Doppler as needed of all accessible portions of each vessel. Bilateral testing is considered an integral part of a complete examination. Limited examinations for reoccurring indications may be performed as noted. The reflux portion of the exam is performed with the patient in reverse Trendelenburg.  +---------+---------------+---------+-----------+----------+--------------+ RIGHT    CompressibilityPhasicitySpontaneityPropertiesThrombus Aging +---------+---------------+---------+-----------+----------+--------------+ CFV      Full           Yes      No                                  +---------+---------------+---------+-----------+----------+--------------+  SFJ       Full                                                        +---------+---------------+---------+-----------+----------+--------------+ FV Prox  Full                                                        +---------+---------------+---------+-----------+----------+--------------+ FV Mid   Full                                                        +---------+---------------+---------+-----------+----------+--------------+ FV DistalFull                                                        +---------+---------------+---------+-----------+----------+--------------+ PFV      Full                                                        +---------+---------------+---------+-----------+----------+--------------+ POP      Full           Yes      Yes                                 +---------+---------------+---------+-----------+----------+--------------+ PTV      Full                                                        +---------+---------------+---------+-----------+----------+--------------+ PERO     Full                                                        +---------+---------------+---------+-----------+----------+--------------+ Gastroc  Full                                                        +---------+---------------+---------+-----------+----------+--------------+   +---------+---------------+---------+-----------+----------+--------------+ LEFT     CompressibilityPhasicitySpontaneityPropertiesThrombus Aging +---------+---------------+---------+-----------+----------+--------------+ CFV      Full           Yes      Yes                                 +---------+---------------+---------+-----------+----------+--------------+  SFJ      Full                                                        +---------+---------------+---------+-----------+----------+--------------+ FV Prox  Full                                                         +---------+---------------+---------+-----------+----------+--------------+ FV Mid   Full                                                        +---------+---------------+---------+-----------+----------+--------------+ FV DistalFull                                                        +---------+---------------+---------+-----------+----------+--------------+ PFV      Full                                                        +---------+---------------+---------+-----------+----------+--------------+ POP      Full           Yes      Yes                                 +---------+---------------+---------+-----------+----------+--------------+ PTV      Full                                                        +---------+---------------+---------+-----------+----------+--------------+ PERO     Full                                                        +---------+---------------+---------+-----------+----------+--------------+ Gastroc  Full                                                        +---------+---------------+---------+-----------+----------+--------------+     Summary: RIGHT: - There is no evidence of deep vein thrombosis in the lower extremity.  - No cystic structure found in the popliteal fossa. Interstitial edema noted throughout  LEFT: - There is no evidence of deep vein thrombosis in the lower extremity.  - No cystic structure  found in the popliteal fossa. Interstitial edema noted throughout.  *See table(s) above for measurements and observations. Electronically signed by Norman Serve on 11/16/2023 at 8:32:40 AM.    Final    DG Chest Port 1 View Result Date: 11/15/2023 CLINICAL DATA:  Shortness of breath. EXAM: PORTABLE CHEST 1 VIEW COMPARISON:  11/14/2023 FINDINGS: The heart size and mediastinal contours are within normal limits. Mildly increased opacity in right lung base, suspicious for developing infiltrate. No pleural  effusion or pneumothorax seen. IMPRESSION: Mildly increased opacity in right lung base, suspicious for developing infiltrate/pneumonia. COPD. Electronically Signed   By: Norleen DELENA Kil M.D.   On: 11/15/2023 06:08   CT Angio Chest Pulmonary Embolism (PE) W or WO Contrast Result Date: 11/14/2023 CLINICAL DATA:  Acute onset shortness of breath and tachycardia EXAM: CT ANGIOGRAPHY CHEST WITH CONTRAST TECHNIQUE: Multidetector CT imaging of the chest was performed using the standard protocol during bolus administration of intravenous contrast. Multiplanar CT image reconstructions and MIPs were obtained to evaluate the vascular anatomy. RADIATION DOSE REDUCTION: This exam was performed according to the departmental dose-optimization program which includes automated exposure control, adjustment of the mA and/or kV according to patient size and/or use of iterative reconstruction technique. CONTRAST:  75mL OMNIPAQUE  IOHEXOL  350 MG/ML SOLN COMPARISON:  Same day chest radiograph, CT chest dated 01/31/2011 FINDINGS: Cardiovascular: The study is high quality for the evaluation of pulmonary embolism. There are no filling defects in the central, lobar, segmental or subsegmental pulmonary artery branches to suggest acute pulmonary embolism. Great vessels are normal in course and caliber. Normal heart size. No significant pericardial fluid/thickening. Coronary artery calcifications and aortic atherosclerosis. Reflux of contrast material into the hepatic veins, suggesting a degree of right heart dysfunction. Mediastinum/Nodes: Imaged thyroid gland without nodules meeting criteria for imaging follow-up by size. Normal esophagus. No pathologically enlarged axillary, supraclavicular, mediastinal, or hilar lymph nodes. 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  Lungs/Pleura: The central airways are patent. Trace layering secretions in the trachea extending into bilateral bronchi. Moderate centrilobular and paraseptal emphysema. Right apical 6 x 3 mm nodule (7:7), unchanged from 01/31/2011, likely benign, favored scarring. Slightly irregular 5 x 2 mm perifissural left upper lobe nodule (7:49), likely perifissural lymph node. Bilateral lower lobe dependent atelectasis/consolidation. No pneumothorax. Trace bilateral pleural effusions, right-greater-than-left. Upper abdomen: Normal. Musculoskeletal: No acute or abnormal lytic or blastic osseous lesions. Review of the MIP images confirms the above findings. IMPRESSION: 1. No evidence of pulmonary embolism. 2. Trace bilateral pleural effusions, right-greater-than-left width bilateral lower lobe dependent atelectasis/consolidation. Superimposed aspiration or pneumonia is not excluded. 3. Reflux of contrast material into the hepatic veins, suggesting a degree of right heart dysfunction. 4. Aortic Atherosclerosis (ICD10-I70.0) and Emphysema (ICD10-J43.9). Coronary artery calcifications. Assessment for potential risk factor modification, dietary therapy or pharmacologic therapy may be warranted, if clinically indicated. Electronically Signed   By: Limin  Xu M.D.   On: 11/14/2023 16:39   ECHOCARDIOGRAM COMPLETE Result Date: 11/14/2023    ECHOCARDIOGRAM REPORT   Patient Name:   Wendy [name] Date of Exam: 11/14/2023 Medical Rec #:  [id number]       Height:       62.5 in Accession #:    [id number]      Weight:       104.2 lb Date of Birth:  Apr 17, 1955       BSA:  1.458 m Patient Age:    68 years        BP:           111/74 mmHg Patient Gender: F               HR:           74 bpm. Exam Location:  Inpatient Procedure: 2D Echo and Intracardiac Opacification Agent (Both Spectral and Color            Flow Doppler were utilized during procedure). Indications:    Chest pain  History:        Patient has no prior history of  Echocardiogram examinations.  Sonographer:    Charmaine Gaskins Referring Phys: [id number] [employer] IMPRESSIONS  1. Left ventricular ejection fraction, by estimation, is 45%. Left ventricular ejection fraction by 2D MOD biplane is 48.7 %. The left ventricle has mildly decreased function. The left ventricle demonstrates regional wall motion abnormalities (see scoring diagram/findings for description). The left ventricular internal cavity size was mildly dilated. Left ventricular diastolic parameters are consistent with Grade I diastolic dysfunction (impaired relaxation).  2. Right ventricular systolic function is normal. The right ventricular size is normal. There is normal pulmonary artery systolic pressure.  3. Left atrial size was mildly dilated.  4. A small pericardial effusion is present. There is no evidence of cardiac tamponade.  5. The mitral valve is normal in structure. Trivial mitral valve regurgitation. No evidence of mitral stenosis.  6. The aortic valve is calcified. Aortic valve regurgitation is moderate. Aortic valve sclerosis is present, with no evidence of aortic valve stenosis.  7. The inferior vena cava is normal in size with greater than 50% respiratory variability, suggesting right atrial pressure of 3 mmHg. Conclusion(s)/Recommendation(s): Mildly reduced LV systolic function = 45%. Regional wall motion abnormalities in an LAD distribution. Relative preservation of the basal contractility which can be seen in Takotsubo as well. Moderate AR. Recommend cardiology consultation for further evaluation. FINDINGS  Left Ventricle: Left ventricular ejection fraction, by estimation, is 45%. Left ventricular ejection fraction by 2D MOD biplane is 48.7 %. The left ventricle has mildly decreased function. The left ventricle demonstrates regional wall motion abnormalities. Definity contrast agent was given IV to delineate the left ventricular endocardial borders. The left ventricular internal cavity size  was mildly dilated. There is no left ventricular hypertrophy. Left ventricular diastolic parameters are consistent with Grade I diastolic dysfunction (impaired relaxation).  LV Wall Scoring: The apical lateral segment, mid inferoseptal segment, apical septal segment, and apex are akinetic. The antero-lateral wall and basal inferoseptal segment are normal. Right Ventricle: The right ventricular size is normal. No increase in right ventricular wall thickness. Right ventricular systolic function is normal. There is normal pulmonary artery systolic pressure. The tricuspid regurgitant velocity is 2.54 m/s, and  with an assumed right atrial pressure of 3 mmHg, the estimated right ventricular systolic pressure is 28.8 mmHg. Left Atrium: Left atrial size was mildly dilated. Right Atrium: Right atrial size was normal in size. Pericardium: A small pericardial effusion is present. There is no evidence of cardiac tamponade. Mitral Valve: The mitral valve is normal in structure. Trivial mitral valve regurgitation. No evidence of mitral valve stenosis. Tricuspid Valve: The tricuspid valve is normal in structure. Tricuspid valve regurgitation is mild . No evidence of tricuspid stenosis. Aortic Valve: The aortic valve is calcified. Aortic valve regurgitation is moderate. Aortic valve sclerosis is present, with no evidence of aortic valve stenosis. Pulmonic Valve: The pulmonic valve was  normal in structure. Pulmonic valve regurgitation is not visualized. No evidence of pulmonic stenosis. Aorta: The aortic root and ascending aorta are structurally normal, with no evidence of dilitation. Venous: The inferior vena cava is normal in size with greater than 50% respiratory variability, suggesting right atrial pressure of 3 mmHg. IAS/Shunts: No atrial level shunt detected by color flow Doppler.  LEFT VENTRICLE PLAX 2D                        Biplane EF (MOD) LVIDd:         4.90 cm         LV Biplane EF:   Left LVIDs:         2.20 cm                           ventricular LV PW:         1.00 cm                          ejection LV IVS:        1.00 cm                          fraction by LVOT diam:     2.10 cm                          2D MOD LVOT Area:     3.46 cm                         biplane is                                                 48.7 %.  LV Volumes (MOD)               Diastology LV vol d, MOD    82.0 ml       LV e' medial:    5.33 cm/s A2C:                           LV E/e' medial:  8.3 LV vol d, MOD    101.0 ml      LV e' lateral:   3.59 cm/s A4C:                           LV E/e' lateral: 12.4 LV vol s, MOD    42.4 ml A2C: LV vol s, MOD    50.6 ml A4C: LV SV MOD A2C:   39.6 ml LV SV MOD A4C:   101.0 ml LV SV MOD BP:    44.7 ml RIGHT VENTRICLE RV Basal diam:  3.10 cm RV Mid diam:    2.60 cm RV S prime:     24.90 cm/s RVSP:           28.8 mmHg LEFT ATRIUM             Index        RIGHT ATRIUM           Index LA diam:  3.10 cm 2.13 cm/m   RA Pressure: 3.00 mmHg LA Vol (A2C):   66.8 ml 45.83 ml/m  RA Area:     12.90 cm LA Vol (A4C):   61.8 ml 42.40 ml/m  RA Volume:   30.20 ml  20.72 ml/m LA Biplane Vol: 65.4 ml 44.87 ml/m   AORTA Ao Root diam: 2.80 cm Ao Asc diam:  2.80 cm MITRAL VALVE               TRICUSPID VALVE MV Area (PHT): 4.31 cm    TR Peak grad:   25.8 mmHg MV Decel Time: 176 msec    TR Vmax:        254.00 cm/s MV E velocity: 44.50 cm/s  Estimated RAP:  3.00 mmHg MV A velocity: 73.90 cm/s  RVSP:           28.8 mmHg MV E/A ratio:  0.60                            SHUNTS                            Systemic Diam: 2.10 cm Georganna Archer Electronically signed by Georganna Archer Signature Date/Time: 11/14/2023/11:18:14 AM    Final    DG Chest Portable 1 View Result Date: 11/14/2023 EXAM: 1 VIEW(S) XRAY OF THE CHEST 11/14/2023 02:34:35 AM COMPARISON: None available. CLINICAL HISTORY: SOB SOB FINDINGS: LUNGS AND PLEURA: Lungs are hyperinflated in keeping with changes of underlying COPD. Interval development of diffuse mild  interstitial thickening, or purulent lung base is most in keeping with mild pulmonary edema. No pleural effusion. No pneumothorax. HEART AND MEDIASTINUM: Cardiac size within normal limits. BONES AND SOFT TISSUES: No acute osseous abnormality. IMPRESSION: 1. Interval development of diffuse mild interstitial thickening, most in keeping with mild pulmonary edema. 2. Hyperinflated lungs with changes of underlying COPD. Electronically signed by: Dorethia Molt MD 11/14/2023 03:38 AM EDT RP Workstation: [id number]    Micro Results    Recent Results (from the past 240 hours)  Resp panel by RT-PCR (RSV, Flu A&B, Covid) Anterior Nasal Swab     Status: None   Collection Time: 11/14/23  2:10 AM   Specimen: Anterior Nasal Swab  Result Value Ref Range Status   SARS Coronavirus 2 by RT PCR NEGATIVE NEGATIVE Final   Influenza A by PCR NEGATIVE NEGATIVE Final   Influenza B by PCR NEGATIVE NEGATIVE Final    Comment: (NOTE) The Xpert Xpress SARS-CoV-2/FLU/RSV plus assay is intended as an aid in the diagnosis of influenza from Nasopharyngeal swab specimens and should not be used as a sole basis for treatment. Nasal washings and aspirates are unacceptable for Xpert Xpress SARS-CoV-2/FLU/RSV testing.  Fact Sheet for Patients: bloggercourse.com  Fact Sheet for Healthcare Providers: seriousbroker.it  This test is not yet approved or cleared by the [place]  FDA and has been authorized for detection and/or diagnosis of SARS-CoV-2 by FDA under an Emergency Use Authorization (EUA). This EUA will remain in effect (meaning this test can be used) for the duration of the COVID-19 declaration under Section 564(b)(1) of the Act, 21 U.S.C. section 360bbb-3(b)(1), unless the authorization is terminated or revoked.     Resp Syncytial Virus by PCR NEGATIVE NEGATIVE Final    Comment: (NOTE) Fact Sheet for  Patients: bloggercourse.com  Fact Sheet for Healthcare Providers: seriousbroker.it  This test is not yet approved or cleared by the  [place]  FDA and has been authorized for detection and/or diagnosis of SARS-CoV-2 by FDA under an Emergency Use Authorization (EUA). This EUA will remain in effect (meaning this test can be used) for the duration of the COVID-19 declaration under Section 564(b)(1) of the Act, 21 U.S.C. section 360bbb-3(b)(1), unless the authorization is terminated or revoked.  Performed at [facility], [address], [place], [place] [address]   MRSA Next Gen by PCR, Nasal     Status: None   Collection Time: 11/14/23  4:51 PM   Specimen: Nasal Mucosa; Nasal Swab  Result Value Ref Range Status   MRSA by PCR Next Gen NOT DETECTED NOT DETECTED Final    Comment: (NOTE) The GeneXpert MRSA Assay (FDA approved for NASAL specimens only), is one component of a comprehensive MRSA colonization surveillance program. It is not intended to diagnose MRSA infection nor to guide or monitor treatment for MRSA infections. Test performance is not FDA approved in patients less than 65 years old. Performed at [facility], [address], [place], [place] [address]     Today   Subjective    Wendy [name] today has no headache,no chest abdominal pain,no new weakness tingling or numbness, feels much better wants to go home today.    Objective   Blood pressure (!) 154/86, pulse 77, temperature 97.7 F (36.5 C), temperature source Oral, resp. rate 19, height 5' 2 (1.575 m), weight 42.6 kg, last menstrual period 01/03/1995, SpO2 93%.   Intake/Output Summary (Last 24 hours) at 11/17/2023 0746 Last data filed at 11/17/2023 0411 Gross per 24 hour  Intake --  Output 850 ml  Net -850 ml    Exam  Awake Alert, No new F.N deficits,    [place].AT,PERRAL Supple Neck,   Symmetrical Chest wall movement, Good air movement  bilaterally, CTAB RRR,No Gallops,   +ve B.Sounds, Abd Soft, Non tender,  No Cyanosis, Clubbing or edema    Data Review   Recent Labs  Lab 11/14/23 0210 11/14/23 0223 11/15/23 0413 11/16/23 0449 11/17/23 0516  WBC 5.7  --  5.5 5.9 4.3  HGB 15.1* 16.0*  16.3* 15.0 13.4 13.8  HCT 46.5* 47.0*  48.0* 43.8 39.0 40.0  PLT 290  --  273 170 275  MCV 98.7  --  93.2 92.9 91.7  MCH 32.1  --  31.9 31.9 31.7  MCHC 32.5  --  34.2 34.4 34.5  RDW 14.6  --  14.5 14.4 14.3  LYMPHSABS 1.5  --   --   --   --   MONOABS 0.3  --   --   --   --   EOSABS 0.1  --   --   --   --   BASOSABS 0.1  --   --   --   --     Recent Labs  Lab 11/14/23 0210 11/14/23 0223 11/14/23 0420 11/14/23 0606 11/14/23 1343 11/15/23 0412 11/15/23 0413 11/16/23 0449 11/17/23 0516  NA 137 139  139  --   --  136  --  136  --   --   K 3.9 3.8  3.8  --   --  3.7  --  4.2  --   --   CL 104 106  --   --  100  --  98  --   --   CO2 19*  --   --   --  19*  --  23  --   --  ANIONGAP 14  --   --   --  17*  --  15  --   --   GLUCOSE 229* 223*  --   --  75  --  91  --   --   BUN 8 8  --   --  8  --  14  --   --   CREATININE 0.89 0.80  --   --  0.75  --  0.75  --   --   AST 41  --   --   --   --   --   --   --   --   ALT 25  --   --   --   --   --   --   --   --   ALKPHOS 66  --   --   --   --   --   --   --   --   BILITOT 0.7  --   --   --   --   --   --   --   --   ALBUMIN 3.6  --   --   --   --   --   --   --   --   CRP  --   --   --  <0.5  --  0.6  --  0.6 0.9  DDIMER  --   --   --   --  1.67*  --   --   --   --   PROCALCITON  --   --   --  <0.10  --  <0.10  --  <0.10 <0.10  LATICACIDVEN  --  3.6* 1.2  --   --   --   --   --   --   TSH  --   --   --   --   --   --  3.030  --   --   HGBA1C 5.0  --   --   --   --   --   --   --   --   BNP 1,097.8*  --   --   --   --  1,131.7*  --  497.7* 366.5*  MG  --   --   --   --  1.4*  --  2.3  --   --   PHOS  --   --   --   --   --   --  3.9  --   --   CALCIUM 8.5*  --   --    --  8.3*  --  8.8*  --   --     Total Time in preparing paper work, data evaluation and todays exam - 35 minutes  Signature  -    Lavada Stank M.D on 11/17/2023 at 7:46 AM   -  To page go to [email or web address]

## 2023-11-17 NOTE — Discharge Instructions (Addendum)
 Follow with Primary MD System, Provider Not In in 7 days   Get CBC, CMP, Magnesium, 2 view Chest X ray -  checked next visit with your primary MD   Activity: As tolerated with Full fall precautions use walker/cane & assistance as needed  Disposition Home    Diet: Heart healthy diet, 1.5 L fluid restriction per day - Check your Weight same time everyday, if you gain over 2 pounds, or you develop in leg swelling, experience more shortness of breath or chest pain, call your Primary MD immediately. Follow Cardiac Low Salt Diet and 1.5 lit/day fluid restriction.  Special Instructions: If you have smoked or chewed Tobacco  in the last 2 yrs please stop smoking, stop any regular Alcohol  and or any Recreational drug use.  On your next visit with your primary care physician please Get Medicines reviewed and adjusted.  Please request your Prim.MD to go over all Hospital Tests and Procedure/Radiological results at the follow up, please get all Hospital records sent to your Prim MD by signing hospital release before you go home.  If you experience worsening of your admission symptoms, develop shortness of breath, life threatening emergency, suicidal or homicidal thoughts you must seek medical attention immediately by calling 911 or calling your MD immediately  if symptoms less severe.  You Must read complete instructions/literature along with all the possible adverse reactions/side effects for all the Medicines you take and that have been prescribed to you. Take any new Medicines after you have completely understood and accpet all the possible adverse reactions/side effects.   Do not drive when taking Pain medications.  Do not take more than prescribed Pain, Sleep and Anxiety Medications  Wear Seat belts while driving.

## 2023-11-17 NOTE — Care Management Important Message (Signed)
 Important Message  Patient Details  Name: Wendy [name] MRN: [id number] Date of Birth: 04/30/1955   Important Message Given:  Yes - Medicare IM   Patient left prior to IM delivery will mail a copy to the patient home address.    Rhian Asebedo 11/17/2023, 4:32 PM

## 2023-11-17 NOTE — Progress Notes (Signed)
 Physical Therapy Treatment Patient Details Name: Wendy [name] MRN: [id number] DOB: 02-02-1955 Today's Date: 11/17/2023   History of Present Illness Pt is a 68 y.o. F who presents 11/14/2023 with sudden onset of shortness of breath.  The patient was noted to be tachycardic with heart rate in the 130s, hypoxic with O2 saturation of 83% on room air and severely hypertensive with SBP close to 200. Chest x-ray with hyperinflated lungs with mild pulmonary edema.  Venous blood gas with pH 7.158/61.5/66.  She was placed on BiPAP with improvement of her respiratory distress.  The patient received IV Lasix 40 mg x 1.  Troponin 174, 411.  EDP discussed the case with cardiology.  Cardiology team suspects the elevation of troponin is secondary to demand ischemia, recommended holding off on heparin drip for now. Significant PMH: rectal cancer status post resection, adjuvant therapy, and external radiation (in 2010), history of right internal jugular DVT (previously on Coumadin), hypertension, generalized anxiety disorder, current tobacco user.    PT Comments  Pt received supine in bed and agreeable to PT session. Able to ambulate 150' today with SPC and CGA. Discussed proper techniques with SPC for in home and community ambulation. No LOB noted today during gait or stair training (3 LOB noted previous session). Pt with slow speed with stair navigation and required verbal cueing for correct sequencing with SPC. Discussed using fanny pack to store phone in case of emergency and to increase availability of hands for functional tasks. Throughout session, SpO2 remained at 94% or higher and HR in the nineties. Continue to recommend [facility] PT upon d/c to continue balance and functional mobility training.     If plan is discharge home, recommend the following: A little help with walking and/or transfers;A little help with bathing/dressing/bathroom;Assistance with cooking/housework;Direct supervision/assist for medications  management;Direct supervision/assist for financial management;Assist for transportation;Help with stairs or ramp for entrance;Supervision due to cognitive status   Can travel by private vehicle        Equipment Recommendations  None recommended by PT    Recommendations for Other Services       Precautions / Restrictions Precautions Precautions: Fall Restrictions Weight Bearing Restrictions Per Provider Order: No     Mobility  Bed Mobility Overal bed mobility: Needs Assistance Bed Mobility: Supine to Sit     Supine to sit: Supervision          Transfers Overall transfer level: Needs assistance Equipment used: Straight cane Transfers: Sit to/from Stand Sit to Stand: Contact guard assist           General transfer comment: CGA for STS. No physical assist required.    Ambulation/Gait Ambulation/Gait assistance: Contact guard assist Gait Distance (Feet): 150 Feet Assistive device: Straight cane Gait Pattern/deviations: Step-through pattern, Decreased step length - right, Decreased step length - left Gait velocity: Dec Gait velocity interpretation: <1.8 ft/sec, indicate of risk for recurrent falls   General Gait Details: No LOB noted today. Initially, pt would not place [facility] on ground to assist with gait, and discussed using it as needed for balance. Pt placed SPC on ground every few steps with inc distance. Pt stated she would haev a hard time using a cane at home as her L UE is weak and she typically uses her R UE to carry dishes/other objects.   Stairs Stairs: Yes Stairs assistance: Contact guard assist Stair Management: Step to pattern, With cane, Forwards Number of Stairs: 3 General stair comments: Pt with initial difficulty navigating stairs with [facility]  in R hand. Handheld assist on pt's L used for stair ascension, and L hand on rail for descension. Required verbal cueing for advancement of L UE on rail for descension, with which pt stated she has a hard time  letting things go once her L UE has grabbed them.   Wheelchair Mobility     Tilt Bed    Modified Rankin (Stroke Patients Only)       Balance Overall balance assessment: Needs assistance Sitting-balance support: No upper extremity supported, Feet supported Sitting balance-Leahy Scale: Fair     Standing balance support: Single extremity supported, No upper extremity supported, During functional activity Standing balance-Leahy Scale: Fair                              Hotel Manager: Impaired Factors Affecting Communication: Difficulty expressing self  Cognition Arousal: Alert Behavior During Therapy: WFL for tasks assessed/performed   PT - Cognitive impairments: Attention, Safety/Judgement                       PT - Cognition Comments: Pt with slow and effortful speech. Required slightly inc time to answer questions. Following commands: Impaired Following commands impaired: Only follows one step commands consistently, Follows multi-step commands inconsistently, Follows multi-step commands with increased time    Cueing Cueing Techniques: Verbal cues, Gestural cues  Exercises      General Comments General comments (skin integrity, edema, etc.): Discussed using a fanny pack to store her phone on her in the case of a fall or emergency.      Pertinent Vitals/Pain Pain Assessment Pain Assessment: Faces Faces Pain Scale: No hurt    Home Living                          Prior Function            PT Goals (current goals can now be found in the care plan section) Acute Rehab PT Goals PT Goal Formulation: With patient Time For Goal Achievement: 11/29/23 Potential to Achieve Goals: Good Progress towards PT goals: Progressing toward goals    Frequency    Min 2X/week      PT Plan      Co-evaluation              AM-PAC PT 6 Clicks Mobility   Outcome Measure  Help needed turning from your back  to your side while in a flat bed without using bedrails?: A Little Help needed moving from lying on your back to sitting on the side of a flat bed without using bedrails?: A Little Help needed moving to and from a bed to a chair (including a wheelchair)?: A Little Help needed standing up from a chair using your arms (e.g., wheelchair or bedside chair)?: A Little Help needed to walk in hospital room?: A Little Help needed climbing 3-5 steps with a railing? : A Little 6 Click Score: 18    End of Session Equipment Utilized During Treatment: Gait belt Activity Tolerance: Patient tolerated treatment well Patient left: in chair;with call bell/phone within reach;with chair alarm set Nurse Communication: Mobility status PT Visit Diagnosis: Unsteadiness on feet (R26.81);Other abnormalities of gait and mobility (R26.89);History of falling (Z91.81)     Time: 9192-9167 PT Time Calculation (min) (ACUTE ONLY): 25 min  Charges:    $Therapeutic Activity: 23-37 mins PT General Charges $$ ACUTE PT VISIT: 1  Visit                     Dorwin Fitzhenry, SPT    Kayden Amend 11/17/2023, 10:28 AM

## 2023-11-17 NOTE — Progress Notes (Signed)
 AVS completed for discharge packet and given to charge nurse.

## 2023-11-17 NOTE — Plan of Care (Signed)

## 2023-11-18 LAB — URINE DRUGS OF ABUSE SCREEN W ALC, ROUTINE (REF LAB)
Amphetamines, Urine: NEGATIVE ng/mL
Barbiturate, Ur: NEGATIVE ng/mL
Benzodiazepine Quant, Ur: NEGATIVE ng/mL
Cocaine (Metab.): NEGATIVE ng/mL
Creatinine, Urine: 14.1 mg/dL — ABNORMAL LOW (ref 20.0–300.0)
Ethanol U, Quan: NEGATIVE %
Methadone Screen, Urine: NEGATIVE ng/mL
Nitrite Urine, Quantitative: NEGATIVE ug/mL
OPIATE SCREEN URINE: NEGATIVE ng/mL
Phencyclidine, Ur: NEGATIVE ng/mL
Propoxyphene, Urine: NEGATIVE ng/mL
pH, Urine: 5 (ref 4.5–8.9)

## 2023-11-18 LAB — PANEL 799049
CARBOXY THC GC/MS CONF: 25 ng/mL
Cannabinoid GC/MS, Ur: POSITIVE — AB

## 2023-11-18 LAB — SPECIFIC GRAVITY: Specific Gravity: 1.0063

## 2023-12-04 ENCOUNTER — Emergency Department (HOSPITAL_COMMUNITY)

## 2023-12-04 ENCOUNTER — Inpatient Hospital Stay (HOSPITAL_COMMUNITY)
Admission: EM | Admit: 2023-12-04 | Discharge: 2023-12-09 | DRG: 280 | Disposition: A | Discharge status: Skilled Nursing Facility | Attending: Family Medicine | Admitting: Family Medicine

## 2023-12-04 DIAGNOSIS — Z85048 Personal history of other malignant neoplasm of rectum, rectosigmoid junction, and anus: Secondary | ICD-10-CM

## 2023-12-04 DIAGNOSIS — Z923 Personal history of irradiation: Secondary | ICD-10-CM

## 2023-12-04 DIAGNOSIS — I2510 Atherosclerotic heart disease of native coronary artery without angina pectoris: Secondary | ICD-10-CM | POA: Diagnosis present

## 2023-12-04 DIAGNOSIS — J9621 Acute and chronic respiratory failure with hypoxia: Secondary | ICD-10-CM | POA: Diagnosis present

## 2023-12-04 DIAGNOSIS — I21A1 Myocardial infarction type 2: Secondary | ICD-10-CM | POA: Diagnosis present

## 2023-12-04 DIAGNOSIS — I110 Hypertensive heart disease with heart failure: Secondary | ICD-10-CM | POA: Diagnosis not present

## 2023-12-04 DIAGNOSIS — Z5941 Food insecurity: Secondary | ICD-10-CM

## 2023-12-04 DIAGNOSIS — R54 Age-related physical debility: Secondary | ICD-10-CM | POA: Diagnosis present

## 2023-12-04 DIAGNOSIS — J441 Chronic obstructive pulmonary disease with (acute) exacerbation: Principal | ICD-10-CM | POA: Diagnosis present

## 2023-12-04 DIAGNOSIS — B182 Chronic viral hepatitis C: Secondary | ICD-10-CM | POA: Diagnosis present

## 2023-12-04 DIAGNOSIS — R579 Shock, unspecified: Secondary | ICD-10-CM | POA: Diagnosis not present

## 2023-12-04 DIAGNOSIS — R570 Cardiogenic shock: Secondary | ICD-10-CM | POA: Diagnosis present

## 2023-12-04 DIAGNOSIS — F419 Anxiety disorder, unspecified: Secondary | ICD-10-CM | POA: Diagnosis present

## 2023-12-04 DIAGNOSIS — I351 Nonrheumatic aortic (valve) insufficiency: Secondary | ICD-10-CM | POA: Diagnosis present

## 2023-12-04 DIAGNOSIS — I252 Old myocardial infarction: Secondary | ICD-10-CM

## 2023-12-04 DIAGNOSIS — Z86718 Personal history of other venous thrombosis and embolism: Secondary | ICD-10-CM

## 2023-12-04 DIAGNOSIS — Z880 Allergy status to penicillin: Secondary | ICD-10-CM

## 2023-12-04 DIAGNOSIS — J9622 Acute and chronic respiratory failure with hypercapnia: Secondary | ICD-10-CM | POA: Diagnosis present

## 2023-12-04 DIAGNOSIS — L89151 Pressure ulcer of sacral region, stage 1: Secondary | ICD-10-CM

## 2023-12-04 DIAGNOSIS — E876 Hypokalemia: Secondary | ICD-10-CM | POA: Diagnosis present

## 2023-12-04 DIAGNOSIS — Z90710 Acquired absence of both cervix and uterus: Secondary | ICD-10-CM

## 2023-12-04 DIAGNOSIS — I214 Non-ST elevation (NSTEMI) myocardial infarction: Secondary | ICD-10-CM

## 2023-12-04 DIAGNOSIS — I3139 Other pericardial effusion (noninflammatory): Secondary | ICD-10-CM | POA: Diagnosis present

## 2023-12-04 DIAGNOSIS — G8929 Other chronic pain: Secondary | ICD-10-CM | POA: Diagnosis present

## 2023-12-04 DIAGNOSIS — E785 Hyperlipidemia, unspecified: Secondary | ICD-10-CM | POA: Diagnosis present

## 2023-12-04 DIAGNOSIS — Z881 Allergy status to other antibiotic agents status: Secondary | ICD-10-CM

## 2023-12-04 DIAGNOSIS — R636 Underweight: Secondary | ICD-10-CM | POA: Diagnosis present

## 2023-12-04 DIAGNOSIS — Z9221 Personal history of antineoplastic chemotherapy: Secondary | ICD-10-CM

## 2023-12-04 DIAGNOSIS — Z79899 Other long term (current) drug therapy: Secondary | ICD-10-CM

## 2023-12-04 DIAGNOSIS — F17210 Nicotine dependence, cigarettes, uncomplicated: Secondary | ICD-10-CM | POA: Diagnosis present

## 2023-12-04 DIAGNOSIS — J9811 Atelectasis: Secondary | ICD-10-CM | POA: Diagnosis present

## 2023-12-04 DIAGNOSIS — I5023 Acute on chronic systolic (congestive) heart failure: Secondary | ICD-10-CM | POA: Diagnosis present

## 2023-12-04 DIAGNOSIS — Z7982 Long term (current) use of aspirin: Secondary | ICD-10-CM

## 2023-12-04 DIAGNOSIS — E874 Mixed disorder of acid-base balance: Secondary | ICD-10-CM | POA: Diagnosis present

## 2023-12-04 DIAGNOSIS — Z681 Body mass index (BMI) 19 or less, adult: Secondary | ICD-10-CM

## 2023-12-04 DIAGNOSIS — J810 Acute pulmonary edema: Secondary | ICD-10-CM

## 2023-12-04 DIAGNOSIS — Z800 Family history of malignant neoplasm of digestive organs: Secondary | ICD-10-CM

## 2023-12-04 DIAGNOSIS — Z8249 Family history of ischemic heart disease and other diseases of the circulatory system: Secondary | ICD-10-CM

## 2023-12-04 LAB — I-STAT VENOUS BLOOD GAS, ED
Acid-base deficit: 11 mmol/L — ABNORMAL HIGH (ref 0.0–2.0)
Bicarbonate: 19.5 mmol/L — ABNORMAL LOW (ref 20.0–28.0)
Calcium, Ion: 1.16 mmol/L (ref 1.15–1.40)
HCT: 42 % (ref 36.0–46.0)
Hemoglobin: 14.3 g/dL (ref 12.0–15.0)
O2 Saturation: 86 %
Potassium: 3.2 mmol/L — ABNORMAL LOW (ref 3.5–5.1)
Sodium: 138 mmol/L (ref 135–145)
TCO2: 21 mmol/L — ABNORMAL LOW (ref 22–32)
pCO2, Ven: 65.2 mmHg — ABNORMAL HIGH (ref 44–60)
pH, Ven: 7.084 — CL (ref 7.25–7.43)
pO2, Ven: 71 mmHg — ABNORMAL HIGH (ref 32–45)

## 2023-12-04 LAB — I-STAT ARTERIAL BLOOD GAS, ED
Acid-base deficit: 10 mmol/L — ABNORMAL HIGH (ref 0.0–2.0)
Bicarbonate: 18.5 mmol/L — ABNORMAL LOW (ref 20.0–28.0)
Calcium, Ion: 1.2 mmol/L (ref 1.15–1.40)
HCT: 40 % (ref 36.0–46.0)
Hemoglobin: 13.6 g/dL (ref 12.0–15.0)
O2 Saturation: 99 %
Patient temperature: 96.6
Potassium: 3.5 mmol/L (ref 3.5–5.1)
Sodium: 137 mmol/L (ref 135–145)
TCO2: 20 mmol/L — ABNORMAL LOW (ref 22–32)
pCO2 arterial: 48.9 mmHg — ABNORMAL HIGH (ref 32–48)
pH, Arterial: 7.18 — CL (ref 7.35–7.45)
pO2, Arterial: 198 mmHg — ABNORMAL HIGH (ref 83–108)

## 2023-12-04 LAB — URINALYSIS, W/ REFLEX TO CULTURE (INFECTION SUSPECTED)
Bilirubin Urine: NEGATIVE
Glucose, UA: NEGATIVE mg/dL
Hgb urine dipstick: NEGATIVE
Ketones, ur: NEGATIVE mg/dL
Leukocytes,Ua: NEGATIVE
Nitrite: NEGATIVE
Protein, ur: NEGATIVE mg/dL
Specific Gravity, Urine: 1.011 (ref 1.005–1.030)
pH: 5 (ref 5.0–8.0)

## 2023-12-04 LAB — BLOOD GAS, VENOUS
Acid-base deficit: 4 mmol/L — ABNORMAL HIGH (ref 0.0–2.0)
Bicarbonate: 23.4 mmol/L (ref 20.0–28.0)
O2 Saturation: 85.1 %
Patient temperature: 37
pCO2, Ven: 51 mmHg (ref 44–60)
pH, Ven: 7.27 (ref 7.25–7.43)
pO2, Ven: 56 mmHg — ABNORMAL HIGH (ref 32–45)

## 2023-12-04 LAB — CBC WITH DIFFERENTIAL/PLATELET
Abs Immature Granulocytes: 0.05 K/uL (ref 0.00–0.07)
Basophils Absolute: 0.1 K/uL (ref 0.0–0.1)
Basophils Relative: 1 %
Eosinophils Absolute: 0.1 K/uL (ref 0.0–0.5)
Eosinophils Relative: 1 %
HCT: 41.9 % (ref 36.0–46.0)
Hemoglobin: 13.2 g/dL (ref 12.0–15.0)
Immature Granulocytes: 1 %
Lymphocytes Relative: 33 %
Lymphs Abs: 2.7 K/uL (ref 0.7–4.0)
MCH: 32 pg (ref 26.0–34.0)
MCHC: 31.5 g/dL (ref 30.0–36.0)
MCV: 101.7 fL — ABNORMAL HIGH (ref 80.0–100.0)
Monocytes Absolute: 0.4 K/uL (ref 0.1–1.0)
Monocytes Relative: 5 %
Neutro Abs: 4.8 K/uL (ref 1.7–7.7)
Neutrophils Relative %: 59 %
Platelets: 353 K/uL (ref 150–400)
RBC: 4.12 MIL/uL (ref 3.87–5.11)
RDW: 15.3 % (ref 11.5–15.5)
WBC: 8.1 K/uL (ref 4.0–10.5)
nRBC: 0 % (ref 0.0–0.2)

## 2023-12-04 LAB — BASIC METABOLIC PANEL WITH GFR
Anion gap: 18 — ABNORMAL HIGH (ref 5–15)
BUN: 11 mg/dL (ref 8–23)
CO2: 17 mmol/L — ABNORMAL LOW (ref 22–32)
Calcium: 8.5 mg/dL — ABNORMAL LOW (ref 8.9–10.3)
Chloride: 104 mmol/L (ref 98–111)
Creatinine, Ser: 0.92 mg/dL (ref 0.44–1.00)
GFR, Estimated: >60 mL/min (ref >60)
Glucose, Bld: 225 mg/dL — ABNORMAL HIGH (ref 70–99)
Potassium: 3.3 mmol/L — ABNORMAL LOW (ref 3.5–5.1)
Sodium: 139 mmol/L (ref 135–145)

## 2023-12-04 LAB — PROTIME-INR
INR: 1.1 (ref 0.8–1.2)
Prothrombin Time: 14.7 s (ref 11.4–15.2)

## 2023-12-04 LAB — I-STAT CG4 LACTIC ACID, ED
Lactic Acid, Venous: 7.5 mmol/L (ref 0.5–1.9)
Lactic Acid, Venous: 2.1 mmol/L (ref 0.5–1.9)

## 2023-12-04 LAB — BRAIN NATRIURETIC PEPTIDE: B Natriuretic Peptide: 1587.8 pg/mL — ABNORMAL HIGH (ref 0.0–100.0)

## 2023-12-04 LAB — TROPONIN I (HIGH SENSITIVITY)
Troponin I (High Sensitivity): 73 ng/L — ABNORMAL HIGH (ref <18)
Troponin I (High Sensitivity): 729 ng/L (ref <18)

## 2023-12-04 MED ORDER — ASPIRIN 81 MG PO CHEW
324.0000 mg | CHEWABLE_TABLET | Freq: Once | ORAL | Status: AC
  Administered 2023-12-05: 324 mg via ORAL
  Filled 2023-12-04: qty 4

## 2023-12-04 MED ORDER — LOPERAMIDE HCL 2 MG PO CAPS
2.0000 mg | ORAL_CAPSULE | Freq: Once | ORAL | Status: AC
  Administered 2023-12-04: 2 mg via ORAL
  Filled 2023-12-04: qty 1

## 2023-12-04 MED ORDER — METHYLPREDNISOLONE SODIUM SUCC 125 MG IJ SOLR
125.0000 mg | Freq: Every day | INTRAMUSCULAR | Status: DC
  Administered 2023-12-04 – 2023-12-05 (×2): 125 mg via INTRAVENOUS
  Filled 2023-12-04 (×2): qty 2

## 2023-12-04 MED ORDER — ONDANSETRON HCL 4 MG/2ML IJ SOLN
4.0000 mg | Freq: Once | INTRAMUSCULAR | Status: AC
  Administered 2023-12-04: 4 mg via INTRAVENOUS
  Filled 2023-12-04: qty 2

## 2023-12-04 MED ORDER — IOHEXOL 350 MG/ML SOLN
50.0000 mL | Freq: Once | INTRAVENOUS | Status: AC | PRN
  Administered 2023-12-04: 50 mL via INTRAVENOUS

## 2023-12-04 MED ORDER — SODIUM CHLORIDE 0.9 % IV SOLN
500.0000 mg | Freq: Once | INTRAVENOUS | Status: AC
  Administered 2023-12-04: 500 mg via INTRAVENOUS
  Filled 2023-12-04: qty 5

## 2023-12-04 MED ORDER — SODIUM CHLORIDE 0.9 % IV SOLN
2.0000 g | Freq: Once | INTRAVENOUS | Status: AC
  Administered 2023-12-04: 2 g via INTRAVENOUS
  Filled 2023-12-04: qty 20

## 2023-12-04 MED ORDER — ALBUTEROL SULFATE (2.5 MG/3ML) 0.083% IN NEBU: 20.0000 mg/h | INHALATION_SOLUTION | RESPIRATORY_TRACT | Status: DC

## 2023-12-04 MED ORDER — FUROSEMIDE 10 MG/ML IJ SOLN
80.0000 mg | Freq: Once | INTRAMUSCULAR | Status: AC
  Administered 2023-12-04: 80 mg via INTRAVENOUS
  Filled 2023-12-04: qty 8

## 2023-12-04 MED ORDER — LACTATED RINGERS IV SOLN: INTRAVENOUS | Status: DC

## 2023-12-04 MED ORDER — MAGNESIUM SULFATE 2 GM/50ML IV SOLN
2.0000 g | Freq: Once | INTRAVENOUS | Status: AC
  Administered 2023-12-04: 2 g via INTRAVENOUS
  Filled 2023-12-04: qty 50

## 2023-12-04 MED ORDER — ALBUTEROL SULFATE (2.5 MG/3ML) 0.083% IN NEBU
INHALATION_SOLUTION | RESPIRATORY_TRACT | Status: AC
Start: 2023-12-04 — End: 2023-12-04
  Administered 2023-12-04: 2.5 mg
  Filled 2023-12-04: qty 24

## 2023-12-04 MED ORDER — NOREPINEPHRINE 4 MG/250ML-% IV SOLN
0.0000 ug/min | INTRAVENOUS | Status: DC
  Administered 2023-12-04: 2 ug/min via INTRAVENOUS
  Filled 2023-12-04: qty 250

## 2023-12-04 MED ORDER — HEPARIN SODIUM (PORCINE) 5000 UNIT/ML IJ SOLN: 60.0000 [IU]/kg | Freq: Once | INTRAMUSCULAR | Status: DC

## 2023-12-04 NOTE — ED Provider Notes (Signed)
 [place] EMERGENCY DEPARTMENT AT [facility] Provider Note   CSN: [id number] Arrival date & time: 12/04/23  1820     Patient presents with: Respiratory Distress   Wendy [name] is a 68 y.o. female.   Hx of rectal cancer status post resection, adjuvant therapy, and external radiation (in 2010), history of right internal jugular DVT (previously on Coumadin), hypertension, generalized anxiety disorder, current tobacco user, HFrEF (45% recent admission in November, 2025) presenting with significant shortness of breath today.  History per EMS, limited per patient secondary to severe SOB.  Per report, patient has been continuously smoking aggressively, was evaluated by send sitting on couch and appeared very tired.  Called EMS, placed on CPAP, administered magnesium .  Tolerated CPAP well.  On arrival, patient tachycardic, severe respiratory distress.  Transition to BiPAP, oriented x 3, however not able to answer other questions secondary to SOB.  On chart review, recent admission 11/14/2023 through 11/17/2023 with shortness of breath.  Echo noted with EF 45% and wall motion abnormality, seen by cardiology, received Lasix  with good improvement hypoxemia resolved, relatively symptom-free.  Clinically does not appear to have aspiration pneumonia, CTA negative, per cardiology on heparin  drip, continue for another 24 hours however doubt this was ACS.  May require left heart cath will defer this to cardiology.  CHF seems to have resolved initiate low-dose beta-blocker, ACE inhibitor, aspirin , since LDL is above goal will initiate Crestor  as well, stable A1c, counseled to quit smoking.  He is now symptom-free will be discharged home on above dictated medications along with as needed Lasix , requested to follow fluid restriction with written instructions.  Postdischarge follow-up with PCP and cardiology.        Prior to Admission medications   Medication Sig Start Date End Date Taking?  Authorizing Provider  aspirin  EC 81 MG tablet Take 1 tablet (81 mg total) by mouth daily. Swallow whole. 11/17/23  Yes Dennise Lavada POUR, MD  carvedilol  (COREG ) 3.125 MG tablet Take 1 tablet (3.125 mg total) by mouth 2 (two) times daily with a meal. 11/17/23  Yes Dennise Lavada POUR, MD  doxycycline  (VIBRA -TABS) 100 MG tablet Take 1 tablet (100 mg total) by mouth every 12 (twelve) hours. 11/17/23  Yes Dennise Lavada POUR, MD  furosemide  (LASIX ) 40 MG tablet Take 0.5 tablets (20 mg total) by mouth daily as needed for fluid or edema. 11/17/23 11/16/24 Yes Singh, Prashant K, MD  isosorbide  mononitrate (IMDUR ) 30 MG 24 hr tablet Take 1 tablet (30 mg total) by mouth daily. 11/17/23  Yes Dennise Lavada POUR, MD  lisinopril  (ZESTRIL ) 5 MG tablet Take 1 tablet (5 mg total) by mouth daily. 11/17/23  Yes Dennise Lavada POUR, MD  pantoprazole  (PROTONIX ) 40 MG tablet Take 1 tablet (40 mg total) by mouth daily. 11/17/23  Yes Dennise Lavada POUR, MD  rosuvastatin  (CRESTOR ) 10 MG tablet Take 1 tablet (10 mg total) by mouth daily. 11/17/23  Yes Singh, Prashant K, MD  acetaminophen  (TYLENOL ) 325 MG tablet Take 650 mg by mouth every 6 (six) hours as needed for mild pain (pain score 1-3) or headache. Patient not taking: Reported on 12/05/2023    [provider]  albuterol  (VENTOLIN  HFA) 108 (90 Base) MCG/ACT inhaler Inhale 2 puffs into the lungs every 6 (six) hours as needed for wheezing or shortness of breath. 11/17/23   Singh, Prashant K, MD    Allergies: Amoxicillin, Vancomycin, and Erythromycin base    Review of Systems  Updated Vital Signs BP 120/65  Pulse 63   Temp (!) 96.6 F (35.9 C) (Axillary)   Resp 19   Ht 5' 2 (1.575 m)   Wt 42.6 kg   LMP 01/03/1995   SpO2 97%   BMI 17.19 kg/m   Physical Exam Vitals and nursing note reviewed.  Constitutional:      General: She is in acute distress.     Appearance: She is ill-appearing and diaphoretic.  HENT:     Head: Normocephalic and atraumatic.      Mouth/Throat:     Mouth: Mucous membranes are dry.     Pharynx: Oropharynx is clear.  Cardiovascular:     Rate and Rhythm: Regular rhythm. Tachycardia present.     Pulses: Normal pulses.     Heart sounds: Normal heart sounds. No murmur heard.    No friction rub. No gallop.  Pulmonary:     Effort: Respiratory distress present.     Breath sounds: Wheezing present.     Comments: Very poor air movement throughout all lung fields, wheezing can be appreciated however significantly difficult secondary to the very poor air movement.  No appreciable rales.  Tachypneic, severe respiratory distress. Abdominal:     General: Abdomen is flat.     Palpations: Abdomen is soft.  Musculoskeletal:     Cervical back: Neck supple.  Skin:    General: Skin is warm.     Capillary Refill: Capillary refill takes 2 to 3 seconds.  Neurological:     Mental Status: She is alert and oriented to person, place, and time.     (all labs ordered are listed, but only abnormal results are displayed) Labs Reviewed  BASIC METABOLIC PANEL WITH GFR - Abnormal; Notable for the following components:      Result Value   Potassium 3.3 (*)    CO2 17 (*)    Glucose, Bld 225 (*)    Calcium  8.5 (*)    Anion gap 18 (*)    All other components within normal limits  CBC WITH DIFFERENTIAL/PLATELET - Abnormal; Notable for the following components:   MCV 101.7 (*)    All other components within normal limits  BRAIN NATRIURETIC PEPTIDE - Abnormal; Notable for the following components:   B Natriuretic Peptide 1,587.8 (*)    All other components within normal limits  URINALYSIS, W/ REFLEX TO CULTURE (INFECTION SUSPECTED) - Abnormal; Notable for the following components:   Bacteria, UA RARE (*)    All other components within normal limits  BLOOD GAS, VENOUS - Abnormal; Notable for the following components:   pO2, Ven 56 (*)    Acid-base deficit 4.0 (*)    All other components within normal limits  I-STAT VENOUS BLOOD GAS, ED -  Abnormal; Notable for the following components:   pH, Ven 7.084 (*)    pCO2, Ven 65.2 (*)    pO2, Ven 71 (*)    Bicarbonate 19.5 (*)    TCO2 21 (*)    Acid-base deficit 11.0 (*)    Potassium 3.2 (*)    All other components within normal limits  I-STAT CG4 LACTIC ACID, ED - Abnormal; Notable for the following components:   Lactic Acid, Venous 7.5 (*)    All other components within normal limits  I-STAT CG4 LACTIC ACID, ED - Abnormal; Notable for the following components:   Lactic Acid, Venous 2.1 (*)    All other components within normal limits  I-STAT ARTERIAL BLOOD GAS, ED - Abnormal; Notable for the following components:   pH,  Arterial 7.180 (*)    pCO2 arterial 48.9 (*)    pO2, Arterial 198 (*)    Bicarbonate 18.5 (*)    TCO2 20 (*)    Acid-base deficit 10.0 (*)    All other components within normal limits  TROPONIN I (HIGH SENSITIVITY) - Abnormal; Notable for the following components:   Troponin I (High Sensitivity) 73 (*)    All other components within normal limits  TROPONIN I (HIGH SENSITIVITY) - Abnormal; Notable for the following components:   Troponin I (High Sensitivity) 729 (*)    All other components within normal limits  TROPONIN I (HIGH SENSITIVITY) - Abnormal; Notable for the following components:   Troponin I (High Sensitivity) 801 (*)    All other components within normal limits  RESP PANEL BY RT-PCR (RSV, FLU A&B, COVID)  RVPGX2  CULTURE, BLOOD (ROUTINE X 2)  CULTURE, BLOOD (ROUTINE X 2)  C DIFFICILE QUICK SCREEN W PCR REFLEX    GASTROINTESTINAL PANEL BY PCR, STOOL (REPLACES STOOL CULTURE)  MRSA NEXT GEN BY PCR, NASAL  PROTIME-INR  CBC  BLOOD GAS, ARTERIAL  CBC  BASIC METABOLIC PANEL WITH GFR  MAGNESIUM   PHOSPHORUS  I-STAT CG4 LACTIC ACID, ED    EKG: EKG Interpretation Date/Time:  Thursday December 04 2023 22:26:42 EST Ventricular Rate:  64 PR Interval:  150 QRS Duration:  93 QT Interval:  477 QTC Calculation: 493 R Axis:   76  Text  Interpretation: Sinus rhythm Probable left atrial enlargement  Anterolateral ST abnormalities Compared with prior EKG from 11/14/2023 Confirmed by Gennaro Bouchard ([id number]) on 12/04/2023 11:57:35 PM  Radiology: CT Angio Chest PE W and/or Wo Contrast Result Date: 12/04/2023 EXAM: CTA CHEST AORTA 12/04/2023 09:27:33 PM TECHNIQUE: CTA of the chest was performed without and with the administration of intravenous contrast. 50 mL (iohexol  (OMNIPAQUE ) 350 MG/ML injection 50 mL IOHEXOL  350 MG/ML SOLN). Multiplanar reformatted images are provided for review. MIP images are provided for review. Automated exposure control, iterative reconstruction, and/or weight based adjustment of the mA/kV was utilized to reduce the radiation dose to as low as reasonably achievable. COMPARISON: Prior examination of 11/14/2023 and remote prior examination of 01/31/2011. CLINICAL HISTORY: evaluate for possible PE FINDINGS: AORTA: Mild atherosclerotic calcification within the thoracic aorta. No aortic aneurysm. No thoracic aortic dissection. MEDIASTINUM: Minimal coronary artery calcification. Global cardiac size within normal limits. The heart and pericardium demonstrate no acute abnormality. No mediastinal lymphadenopathy. LYMPH NODES: No mediastinal, hilar or axillary lymphadenopathy. LUNGS AND PLEURA: There is suboptimal opacification of the pulmonary arterial tree due to bolus timing. Examination is adequate only for exclusion of large pulmonary embolus within the main and central right and left pulmonary arteries. The lobar, segmental and subsegmental pulmonary arteries are not well assessed on this examination. Central pulmonary arteries are of normal caliber. 5 mm microlobulated nodule within the right apex is unchanged from prior examination of 11/14/2023 but new from remote prior examination of 01/31/2011. Moderate emphysema. Superimposed smooth interlobular septal thickening and ground glass infiltrate as well as small bilateral  pleural effusions are in keeping with moderate cardiogenic failure with moderate interstitial and alveolar pulmonary edema. Bibasilar atelectasis is present. No pneumothorax. Right mainstem bronchus and left lower lobe bronchus. UPPER ABDOMEN: Limited images of the upper abdomen are unremarkable. SOFT TISSUES AND BONES: Osseous structures are age appropriate. No acute bone abnormality. No lytic or blastic bone lesion. No acute soft tissue abnormality. IMPRESSION: 1. No evidence of large pulmonary embolus within the main and central right and left  pulmonary arteries. Lobar, segmental, and subsegmental pulmonary arteries are not well assessed due to suboptimal opacification. 2. Moderate emphysema. Given that emphysema is an independent risk factor for lung cancer and assuming typical screening age, consider evaluation for a low-dose CT lung cancer screening program if the patient meets eligibility criteria. 3. Findings consistent with moderate cardiogenic failure with moderate interstitial and alveolar pulmonary edema, with small bilateral pleural effusions and bibasilar atelectasis. 4. 5 mm microlobulated right apical pulmonary nodule, unchanged from 11/14/2023 and new from 01/31/2011. For an incidental solid nodule of 05 mm without specified high-risk features, no routine follow-up imaging is recommended per Fleischner Society Guidelines; if the patient is considered high risk or the nodule has high-risk features (including upper lobe location), optional chest CT at 12 months may be considered; if stable at 12 months, no further follow-up. Electronically signed by: Dorethia Molt MD 12/04/2023 10:06 PM EST RP Workstation: [id number]   DG Chest Port 1 View Result Date: 12/04/2023 CLINICAL DATA:  Questionable sepsis. EXAM: PORTABLE CHEST 1 VIEW COMPARISON:  Chest radiograph dated 11/15/2023 FINDINGS: Background of emphysema and diffuse interstitial coarsening slightly progressed since the prior radiograph.  Superimposed pneumonia is not excluded. No consolidative changes. No large pleural effusion or pneumothorax. Stable cardiac silhouette. No acute osseous pathology. IMPRESSION: Emphysema and diffuse interstitial coarsening slightly progressed since the prior radiograph. Superimposed pneumonia is not excluded. Electronically Signed   By: Vanetta Chou M.D.   On: 12/04/2023 19:39     Procedures   Medications Ordered in the ED  lactated ringers  infusion ( Intravenous New Bag/Given 12/04/23 1901)  methylPREDNISolone  sodium succinate (SOLU-MEDROL ) 125 mg/2 mL injection 125 mg (125 mg Intravenous Given 12/04/23 2041)  norepinephrine  (LEVOPHED ) 4mg  in (0.016 mg/mL) premix infusion (2 mcg/min Intravenous New Bag/Given 12/04/23 2327)  ipratropium-albuterol  (DUONEB) 0.5-2.5 (3) MG/3ML nebulizer solution 3 mL (3 mLs Nebulization Given 12/05/23 0147)  albuterol  (PROVENTIL ) (2.5 MG/3ML) 0.083% nebulizer solution 2.5 mg (has no administration in time range)  Chlorhexidine  Gluconate Cloth 2 % PADS 6 each (has no administration in time range)  docusate sodium  (COLACE) capsule 100 mg (has no administration in time range)  polyethylene glycol (MIRALAX  / GLYCOLAX ) packet 17 g (has no administration in time range)  heparin  injection 5,000 Units (has no administration in time range)  piperacillin -tazobactam (ZOSYN ) IVPB 3.375 g (3.375 g Intravenous New Bag/Given 12/05/23 0145)  albuterol  (PROVENTIL ) (2.5 MG/3ML) 0.083% nebulizer solution (2.5 mg  Given 12/04/23 1845)  magnesium  sulfate IVPB 2 g 50 mL (0 g Intravenous Stopped 12/04/23 2028)  cefTRIAXone  (ROCEPHIN ) 2 g in sodium chloride  0.9 % 100 mL IVPB (0 g Intravenous Stopped 12/04/23 2231)  azithromycin  (ZITHROMAX ) 500 mg in sodium chloride  0.9 % 250 mL IVPB (0 mg Intravenous Stopped 12/04/23 2109)  loperamide  (IMODIUM ) capsule 2 mg (2 mg Oral Given 12/04/23 2042)  ondansetron  (ZOFRAN ) injection 4 mg (4 mg Intravenous Given 12/04/23 2041)  furosemide   (LASIX ) injection 80 mg (80 mg Intravenous Given 12/04/23 2100)  iohexol  (OMNIPAQUE ) 350 MG/ML injection 50 mL (50 mLs Intravenous Contrast Given 12/04/23 2128)  aspirin  chewable tablet 324 mg (324 mg Oral Given 12/05/23 0000)  heparin  bolus via infusion 1,000 Units (1,000 Units Intravenous Bolus from Bag 12/05/23 0020)  potassium chloride  SA (KLOR-CON  M) CR tablet 40 mEq (40 mEq Oral Given 12/05/23 0139)    Clinical Course as of 12/05/23 0148  Thu Dec 04, 2023  2041 Bedside echo performed: LVEF 40 to 45%, focal B-lines present in bilateral lower lung fields. [BS]  2126  Patient cannot get a head CT at this time as she has just received contrast.  She will need to wait 6 hours for the contrast to be able to washout before can receive head CT. [BS]  2143 Patient looks significantly improved, have been taken off the BiPAP, and then immediately attempted to throw up.  She is maintaining her airway well, will receive repeat VBG, patient will be stable for stepdown unit at this time. [BS]  2321 Pt becoming more hypotensive, will start on levophed  at this time . [BS]  Fri Dec 05, 2023  0000 Per EKG, there is evidence of EKG changes that appear c/f ischemia w/ elevated trop, c/f NSTEMI, will start Heparin . Cardiology consulted and agrees.  [BS]    Clinical Course User Index [BS] Arlee Katz, MD                                 Medical Decision Making Amount and/or Complexity of Data Reviewed Labs: ordered. Radiology: ordered.  Risk OTC drugs. Prescription drug management. Decision regarding hospitalization.   Based on patient presentation, history, evaluation, high suspicion for NSTEMI, severe COPD exacerbation, and severe CHF exacerbation.  Patient presented in severe respiratory distress, placed on BiPAP, given medications for COPD exacerbation with significant improvement in respiratory status, became far more alert and oriented, responsive.  Tolerated BiPAP well.  Patient did attempt  to throughout, therefore BiPAP was removed, repeat VBG with clear evidence of improvement in respiratory status.  Will hold on further BiPAP at this time.  Satting well on 6 L O2 [place].  Of note, bedside echo was performed, EF was maintained in comparison to prior admission, focal B-lines appreciated however no significant acute pulmonary vascular congestion, lower suspicion for scape.  Of note, patient became progressively hypotensive while in the ED, with severe fluid overload status at this time, patient was started on low-dose Levophed  for vascular support.  Overall will require admission to the ICU with his vasopressor requirement.  Patient of note also has an uptrending troponin, concerning for NSTEMI along with acute EKG changes.  Started on heparin , cardiology consulted, likely plan for cardiac intervention in the coming days.  Lactic acid significantly improved on the ED.  pCO2 down trended from 65-51.  Negative COVID and flu.  Hemodynamically stable at this time with Levophed .     Final diagnoses:  COPD exacerbation (HCC)  Acute on chronic systolic congestive heart failure (HCC)  NSTEMI (non-ST elevated myocardial infarction) [facility])    ED Discharge Orders     None          Arlee Katz, MD 12/05/23 0149    Gennaro Bouchard L, DO 12/08/23 1721

## 2023-12-04 NOTE — Sepsis Progress Note (Signed)
 Notified provider of need to order fluid bolus.  Per MD, patient has pulmonary edema, no boluses will be ordered.

## 2023-12-04 NOTE — Progress Notes (Signed)
 PHARMACY - ANTICOAGULATION CONSULT NOTE  Pharmacy Consult for heparin  Indication: chest pain/ACS  Allergies  Allergen Reactions   Amoxicillin Rash   Vancomycin Itching    Erythema and mild itching -  Resolved with IV Benadryl .   Erythromycin Base Hives and Other (See Comments)    Pt states it makes her shaky.    Patient Measurements: Height: 5' 2 (157.5 cm) Weight: 42.6 kg (94 lb) IBW/kg (Calculated) : 50.1 HEPARIN  DW (KG): 42.6  Vital Signs: Temp: 96.6 F (35.9 C) (11/20 1826) Temp Source: Axillary (11/20 1826) BP: 88/62 (11/20 2330) Pulse Rate: 63 (11/20 2330)  Labs: Recent Labs    12/04/23 1840 12/04/23 1844 12/04/23 1847 12/04/23 2059  HGB 13.2 14.3 13.6  --   HCT 41.9 42.0 40.0  --   PLT 353  --   --   --   LABPROT 14.7  --   --   --   INR 1.1  --   --   --   CREATININE 0.92  --   --   --   TROPONINIHS 73*  --   --  729*    Estimated Creatinine Clearance: 40.5 mL/min (by C-G formula based on SCr of 0.92 mg/dL).   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  Pt was admitted a few weeks ago and was on heparin  requiring higher units/kg rate than expected.  Goal of Therapy:  Heparin  level 0.3-0.7 units/ml Monitor platelets by anticoagulation protocol: Yes   Plan:  Heparin  1000 units IV bolus followed by infusion at 750 units/hr. Monitor heparin  levels and CBC.  Marvetta Dauphin, PharmD, BCPS   12/04/2023,11:55 PM

## 2023-12-04 NOTE — ED Notes (Addendum)
 Pt on bedpan.

## 2023-12-04 NOTE — Sepsis Progress Note (Signed)
 Elink monitoring for the code sepsis protocol.

## 2023-12-04 NOTE — ED Triage Notes (Signed)
 PT BIB [facility] from home sudden onset resp distress, PT's son came states he found mother on couch at home. PT initial BP was 130/100, diaphoretic and able to conversate.  CPAP used in past, EMS placed bipap on PT and PT continued to deteriorate en route. 2g magnesium given with EMS. Aox4.   EMS VS: BP 200/130, HR 110 96% Spo2 ETCO2 20, RR 30

## 2023-12-04 NOTE — Consult Note (Signed)
 Cardiology Consultation   Patient ID: Wendy [name] MRN: [id number]; DOB: 1955/09/25  Admit date: 12/04/2023 Date of Consult: 12/05/2023  PCP:  System, Provider Not In   [facility] Providers Cardiologist:  Lonni Cash, MD        Patient Profile: Wendy [name] is a 68 y.o. female with a hx of rectal cancer status post resection, adjuvant therapy, external radiation, DVT previously on Coumadin, tobacco use, hepatitis C, COPD, heart failure with moderately reduced ejection fraction, coronary artery calcification, hypertension, hyperlipidemia who is being seen 12/05/2023 for the evaluation of NSTEMI at the request of ED.  History of Present Illness: [name] had not had much medical contact until 11/14/2023 when she presented with acute shortness of breath and was found to be in hypoxic and hypercarbic respiratory failure, hypertensive, tachycardic with mild shock.  She had an NSTEMI, treated as ACS but ultimately felt likely more related to demand ischemia.  Her EF was 45% with akinesis of the apical lateral segment, mid inferior septal segment, and apical septal segment, moderate AI.  She was treated for COPD exacerbation with improvement of symptoms.  She has significant ongoing tobacco use.  Today, she again had acute onset shortness of breath while outside smoking, called EMS.  On arrival she was in severe respiratory distress tachycardic, was put on BiPAP given magnesium .  Noted be significantly acidotic with both a respiratory and metabolic acidosis.  Lactate 7 improved to 2.  Started on antibiotics for CAP, given Methylpred.  He has anterior lateral T wave inversions new from prior, troponin [id number].  BNP 1500 (previously 366).  Had improved respiratory distress but became more hypotensive requiring pressors.  Plan to admit to MICU.  On my examination, she is feeling much better.  She states that she has been progressive becoming more short of breath since  her discharge.  Does not think she is gaining fluid or weight.  Denies lower extremity edema, PND, orthopnea.  Denies chest pain  Bedside POCUS actually demonstrates improved LV function without wall motion abnormalities seen previously.  She still has moderate AI but LV dimensions normal.  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   APPENDECTOMY  1996   BLADDER REPAIR     CLOSED REDUCTION NASAL FRACTURE N/A 10/12/2012   Procedure: CLOSED REDUCTION NASAL BONE FRACTURE WITH STABILZATION ;  Surgeon: Lonni LITTIE Sax, DDS;  Location: [facility] OR;  Service: Oral Surgery;  Laterality: N/A;   COLON SURGERY     COLONOSCOPY W/ BIOPSIES AND POLYPECTOMY     Hx; of   TONSILLECTOMY     TONSILLECTOMY AND ADENOIDECTOMY       Home Medications:  Prior to Admission medications   Medication Sig Start Date End Date Taking? Authorizing Provider  acetaminophen  (TYLENOL ) 325 MG tablet Take 650 mg by mouth every 6 (six) hours as needed  for mild pain (pain score 1-3) or headache.    [provider]  albuterol  (VENTOLIN  HFA) 108 (90 Base) MCG/ACT inhaler Inhale 2 puffs into the lungs every 6 (six) hours as needed for wheezing or shortness of breath. 11/17/23   Singh, Prashant K, MD  aspirin  EC 81 MG tablet Take 1 tablet (81 mg total) by mouth daily. Swallow whole. 11/17/23   Singh, Prashant K, MD  carvedilol  (COREG ) 3.125 MG tablet Take 1 tablet (3.125 mg total) by mouth 2 (two) times daily with a meal.  11/17/23   Dennise Lavada POUR, MD  doxycycline  (VIBRA -TABS) 100 MG tablet Take 1 tablet (100 mg total) by mouth every 12 (twelve) hours. 11/17/23   Singh, Prashant K, MD  furosemide  (LASIX ) 40 MG tablet Take 0.5 tablets (20 mg total) by mouth daily as needed for fluid or edema. 11/17/23 11/16/24  Singh, Prashant K, MD  isosorbide  mononitrate (IMDUR ) 30 MG 24 hr tablet Take 1 tablet (30 mg total) by mouth daily. 11/17/23   Singh, Prashant K, MD  lisinopril  (ZESTRIL ) 5 MG tablet Take 1 tablet (5 mg total) by mouth daily. 11/17/23   Singh, Prashant K, MD  pantoprazole  (PROTONIX ) 40 MG tablet Take 1 tablet (40 mg total) by mouth daily. 11/17/23   Singh, Prashant K, MD  rosuvastatin  (CRESTOR ) 10 MG tablet Take 1 tablet (10 mg total) by mouth daily. 11/17/23   Singh, Prashant K, MD    Scheduled Meds:  heparin   1,000 Units Intravenous Once   methylPREDNISolone  (SOLU-MEDROL ) injection  125 mg Intravenous Daily   Continuous Infusions:  albuterol      heparin      lactated ringers  150 mL/hr at 12/04/23 1901   norepinephrine  (LEVOPHED ) Adult infusion 2 mcg/min (12/04/23 2327)   PRN Meds:  Allergies:    Allergies  Allergen Reactions   Amoxicillin Rash   Vancomycin Itching    Erythema and mild itching -  Resolved with IV Benadryl .   Erythromycin Base Hives and Other (See Comments)    Pt states it makes her shaky.    Social History:   Social History   Socioeconomic History   Marital status: Single    Spouse name: Not on file   Number of children: Not on file   Years of education: Not on file   Highest education level: Not on file  Occupational History   Not on file  Tobacco Use   Smoking status: Every Day    Current packs/day: 1.50    Types: Cigarettes   Smokeless tobacco: Never  Vaping Use   Vaping status: Never Used  Substance and Sexual Activity   Alcohol use: Yes    Alcohol/week: 20.0 standard drinks of alcohol    Types: 20 Shots of liquor per week   Drug use: No   Sexual activity:  Not Currently  Other Topics Concern   Not on file  Social History Narrative   Not on file   Social Drivers of Health   Financial Resource Strain: Not on file  Food Insecurity: Food Insecurity Present (11/14/2023)   Hunger Vital Sign    Worried About Running Out of Food in the Last Year: Sometimes true    Ran Out of Food in the Last Year: Never true  Transportation Needs: No Transportation Needs (11/14/2023)   PRAPARE - Administrator, Civil Service (Medical): No    Lack of Transportation (Non-Medical): No  Physical Activity: Not on file  Stress: Not on file  Social Connections: Socially Isolated (  11/14/2023)   Social Connection and Isolation Panel    Frequency of Communication with Friends and Family: More than three times a week    Frequency of Social Gatherings with Friends and Family: More than three times a week    Attends Religious Services: Never    Database Administrator or Organizations: No    Attends Banker Meetings: Never    Marital Status: Separated  Intimate Partner Violence: Not At Risk (11/14/2023)   Humiliation, Afraid, Rape, and Kick questionnaire    Fear of Current or Ex-Partner: No    Emotionally Abused: No    Physically Abused: No    Sexually Abused: No    Family History:   Family History  Problem Relation Age of Onset   Colon cancer Mother    Hypertension Mother    Hyperparathyroidism Mother    Colon cancer Father    Cancer Father    Cancer Other      ROS:  Please see the history of present illness.  All other ROS reviewed and negative.     Physical Exam/Data: Vitals:   12/04/23 2303 12/04/23 2315 12/04/23 2330 12/04/23 2345  BP: (!) 84/59  (!) 88/62 93/63  Pulse: 60  63 63  Resp: (!) 24  (!) 23 20  Temp:      TempSrc:      SpO2: 97% 97% 97% 98%  Weight:   42.6 kg   Height:   5' 2 (1.575 m)    No intake or output data in the 24 hours ending 12/05/23 0004    12/04/2023   11:30 PM 11/14/2023    3:15 PM  06/23/2014    2:32 PM  Last 3 Weights  Weight (lbs) 94 lb 94 lb 104 lb 3.2 oz  Weight (kg) 42.638 kg 42.638 kg 47.265 kg     Body mass index is 17.19 kg/m.  General: Chronically ill-appearing, no acute distress HEENT: Poor dentition Neck: no JVD Vascular: No carotid bruits; Distal pulses 2+ bilaterally Cardiac:  normal S1, S2; RRR; 2 out of 6 diastolic murmur Lungs: Moving air, coarse breath sounds with expiration Abd: soft, nontender, no hepatomegaly  Ext: no edema Musculoskeletal:  No deformities, BUE and BLE strength normal and equal Skin: warm and dry  Neuro:  CNs 2-12 intact, no focal abnormalities noted Psych:  Normal affect   EKG:  The EKG was personally reviewed and demonstrates: Initial sinus tachycardia with diffuse anterior lateral ST depressions and T wave inversions, repeat sinus rhythm with anterior lateral T wave inversions Telemetry:  Telemetry was personally reviewed and demonstrates: Sinus  Relevant CV Studies: IMPRESSIONS    1. Left ventricular ejection fraction, by estimation, is 45%. Left  ventricular ejection fraction by 2D MOD biplane is 48.7 %. The left  ventricle has mildly decreased function. The left ventricle demonstrates  regional wall motion abnormalities (see  scoring diagram/findings for description). The left ventricular internal  cavity size was mildly dilated. Left ventricular diastolic parameters are  consistent with Grade I diastolic dysfunction (impaired relaxation).   2. Right ventricular systolic function is normal. The right ventricular  size is normal. There is normal pulmonary artery systolic pressure.   3. Left atrial size was mildly dilated.   4. A small pericardial effusion is present. There is no evidence of  cardiac tamponade.   5. The mitral valve is normal in structure. Trivial mitral valve  regurgitation. No evidence of mitral stenosis.   6. The aortic valve is calcified. Aortic  valve regurgitation is moderate.  Aortic valve  sclerosis is present, with no evidence of aortic valve  stenosis.   7. The inferior vena cava is normal in size with greater than 50%  respiratory variability, suggesting right atrial pressure of 3 mmHg.   Conclusion(s)/Recommendation(s): Mildly reduced LV systolic function =  45%. Regional wall motion abnormalities in an LAD distribution. Relative  preservation of the basal contractility which can be seen in [place] as  well. Moderate AR. Recommend  cardiology consultation for further evaluation.   Laboratory Data: High Sensitivity Troponin:   Recent Labs  Lab 11/14/23 0416 11/14/23 0730 11/14/23 0829 12/04/23 1840 12/04/23 2059  TROPONINIHS 411* 499* 495* 73* 729*     Chemistry Recent Labs  Lab 12/04/23 1840 12/04/23 1844 12/04/23 1847  NA 139 138 137  K 3.3* 3.2* 3.5  CL 104  --   --   CO2 17*  --   --   GLUCOSE 225*  --   --   BUN 11  --   --   CREATININE 0.92  --   --   CALCIUM  8.5*  --   --   GFRNONAA >60  --   --   ANIONGAP 18*  --   --     No results for input(s): PROT, ALBUMIN, AST, ALT, ALKPHOS, BILITOT in the last 168 hours. Lipids No results for input(s): CHOL, TRIG, HDL, LABVLDL, LDLCALC, CHOLHDL in the last 168 hours.  Hematology Recent Labs  Lab 12/04/23 1840 12/04/23 1844 12/04/23 1847  WBC 8.1  --   --   RBC 4.12  --   --   HGB 13.2 14.3 13.6  HCT 41.9 42.0 40.0  MCV 101.7*  --   --   MCH 32.0  --   --   MCHC 31.5  --   --   RDW 15.3  --   --   PLT 353  --   --    Thyroid No results for input(s): TSH, FREET4 in the last 168 hours.  BNP Recent Labs  Lab 12/04/23 1840  BNP 1,587.8*    DDimer No results for input(s): DDIMER in the last 168 hours.  Radiology/Studies:  CT Angio Chest PE W and/or Wo Contrast Result Date: 12/04/2023 EXAM: CTA CHEST AORTA 12/04/2023 09:27:33 PM TECHNIQUE: CTA of the chest was performed without and with the administration of intravenous contrast. 50 mL (iohexol  (OMNIPAQUE )  350 MG/ML injection 50 mL IOHEXOL  350 MG/ML SOLN). Multiplanar reformatted images are provided for review. MIP images are provided for review. Automated exposure control, iterative reconstruction, and/or weight based adjustment of the mA/kV was utilized to reduce the radiation dose to as low as reasonably achievable. COMPARISON: Prior examination of 11/14/2023 and remote prior examination of 01/31/2011. CLINICAL HISTORY: evaluate for possible PE FINDINGS: AORTA: Mild atherosclerotic calcification within the thoracic aorta. No aortic aneurysm. No thoracic aortic dissection. MEDIASTINUM: Minimal coronary artery calcification. Global cardiac size within normal limits. The heart and pericardium demonstrate no acute abnormality. No mediastinal lymphadenopathy. LYMPH NODES: No mediastinal, hilar or axillary lymphadenopathy. LUNGS AND PLEURA: There is suboptimal opacification of the pulmonary arterial tree due to bolus timing. Examination is adequate only for exclusion of large pulmonary embolus within the main and central right and left pulmonary arteries. The lobar, segmental and subsegmental pulmonary arteries are not well assessed on this examination. Central pulmonary arteries are of normal caliber. 5 mm microlobulated nodule within the right apex is unchanged from prior examination of 11/14/2023 but new from remote  prior examination of 01/31/2011. Moderate emphysema. Superimposed smooth interlobular septal thickening and ground glass infiltrate as well as small bilateral pleural effusions are in keeping with moderate cardiogenic failure with moderate interstitial and alveolar pulmonary edema. Bibasilar atelectasis is present. No pneumothorax. Right mainstem bronchus and left lower lobe bronchus. UPPER ABDOMEN: Limited images of the upper abdomen are unremarkable. SOFT TISSUES AND BONES: Osseous structures are age appropriate. No acute bone abnormality. No lytic or blastic bone lesion. No acute soft tissue  abnormality. IMPRESSION: 1. No evidence of large pulmonary embolus within the main and central right and left pulmonary arteries. Lobar, segmental, and subsegmental pulmonary arteries are not well assessed due to suboptimal opacification. 2. Moderate emphysema. Given that emphysema is an independent risk factor for lung cancer and assuming typical screening age, consider evaluation for a low-dose CT lung cancer screening program if the patient meets eligibility criteria. 3. Findings consistent with moderate cardiogenic failure with moderate interstitial and alveolar pulmonary edema, with small bilateral pleural effusions and bibasilar atelectasis. 4. 5 mm microlobulated right apical pulmonary nodule, unchanged from 11/14/2023 and new from 01/31/2011. For an incidental solid nodule of 05 mm without specified high-risk features, no routine follow-up imaging is recommended per Fleischner Society Guidelines; if the patient is considered high risk or the nodule has high-risk features (including upper lobe location), optional chest CT at 12 months may be considered; if stable at 12 months, no further follow-up. Electronically signed by: Dorethia Molt MD 12/04/2023 10:06 PM EST RP Workstation: [id number]   DG Chest Port 1 View Result Date: 12/04/2023 CLINICAL DATA:  Questionable sepsis. EXAM: PORTABLE CHEST 1 VIEW COMPARISON:  Chest radiograph dated 11/15/2023 FINDINGS: Background of emphysema and diffuse interstitial coarsening slightly progressed since the prior radiograph. Superimposed pneumonia is not excluded. No consolidative changes. No large pleural effusion or pneumothorax. Stable cardiac silhouette. No acute osseous pathology. IMPRESSION: Emphysema and diffuse interstitial coarsening slightly progressed since the prior radiograph. Superimposed pneumonia is not excluded. Electronically Signed   By: Vanetta Chou M.D.   On: 12/04/2023 19:39   Assessment and Plan:  NSTEMI Heart failure with mildly  reduced ejection fraction, focal wall motion abnormalities Moderate AI She presents with acute hypoxic and hypercarbic respiratory failure with mixed metabolic and respiratory acidosis likely in setting of COPD exacerbation with ongoing tobacco use.  Being treated for that with improved respiratory status.  She did have elevated troponin with EKG changes, previously had wall motion abnormalities on last hospitalization.  On my bedside POCUS her cardiac function has improved, normal wall function throughout suggesting demand related presentation rather than type I acute coronary syndrome.  Would favor treating medically as she was doing prior, no need for heparinization at this time.  Suspect she does have some left circumflex versus LAD disease but do not think this was leading to her presentation.  She does have moderate AI but LV  cavity not dilated, she does not appear grossly volume overloaded but her BNP is elevated likely in the setting of her acute respiratory presentation. - Continue home aspirin , Coreg , Lasix , ACE inhibitor, Crestor  - Can consider ischemic workup as an outpatient versus left heart catheterization  Acute COPD exacerbation complicated by hypoxic and hypercarbic respiratory failure and shock Mixed metabolic and respiratory acidosis Patient be admitted to the MICU, continue medical manage per them  Risk Assessment/Risk Scores:    TIMI Risk Score for Unstable Angina or Non-ST Elevation MI:   The patient's TIMI risk score is 5, which indicates a 26% risk of  all cause mortality, new or recurrent myocardial infarction or need for urgent revascularization in the next 14 days.  [place]  Heart Association (NYHA) Functional Class NYHA Class II     For questions or updates, please contact [facility] Please consult [email or web address] for contact info under    Signed, Ozell Bushman, MD  12/05/2023 12:04 AM

## 2023-12-04 NOTE — ED Notes (Signed)
 Pt states she has had diarrhea for the past two days. Pt states imodium  doesn't seem to work and would like to have something for gas.  RN will notify provider.

## 2023-12-04 NOTE — Progress Notes (Signed)
 Pt transported via BiPAP to CT and back w/o complications.

## 2023-12-05 ENCOUNTER — Inpatient Hospital Stay (HOSPITAL_COMMUNITY)

## 2023-12-05 ENCOUNTER — Other Ambulatory Visit: Payer: Self-pay

## 2023-12-05 ENCOUNTER — Encounter (HOSPITAL_COMMUNITY): Payer: Self-pay | Admitting: Critical Care Medicine

## 2023-12-05 DIAGNOSIS — R570 Cardiogenic shock: Secondary | ICD-10-CM | POA: Diagnosis present

## 2023-12-05 DIAGNOSIS — I5023 Acute on chronic systolic (congestive) heart failure: Secondary | ICD-10-CM | POA: Diagnosis present

## 2023-12-05 DIAGNOSIS — Z7982 Long term (current) use of aspirin: Secondary | ICD-10-CM | POA: Diagnosis not present

## 2023-12-05 DIAGNOSIS — F17210 Nicotine dependence, cigarettes, uncomplicated: Secondary | ICD-10-CM | POA: Diagnosis present

## 2023-12-05 DIAGNOSIS — I2489 Other forms of acute ischemic heart disease: Secondary | ICD-10-CM | POA: Diagnosis not present

## 2023-12-05 DIAGNOSIS — I110 Hypertensive heart disease with heart failure: Secondary | ICD-10-CM | POA: Diagnosis present

## 2023-12-05 DIAGNOSIS — R7989 Other specified abnormal findings of blood chemistry: Secondary | ICD-10-CM | POA: Diagnosis not present

## 2023-12-05 DIAGNOSIS — I3139 Other pericardial effusion (noninflammatory): Secondary | ICD-10-CM | POA: Diagnosis present

## 2023-12-05 DIAGNOSIS — J810 Acute pulmonary edema: Secondary | ICD-10-CM | POA: Diagnosis not present

## 2023-12-05 DIAGNOSIS — I214 Non-ST elevation (NSTEMI) myocardial infarction: Secondary | ICD-10-CM | POA: Diagnosis not present

## 2023-12-05 DIAGNOSIS — J441 Chronic obstructive pulmonary disease with (acute) exacerbation: Secondary | ICD-10-CM | POA: Diagnosis present

## 2023-12-05 DIAGNOSIS — J9811 Atelectasis: Secondary | ICD-10-CM | POA: Diagnosis present

## 2023-12-05 DIAGNOSIS — I2510 Atherosclerotic heart disease of native coronary artery without angina pectoris: Secondary | ICD-10-CM | POA: Diagnosis present

## 2023-12-05 DIAGNOSIS — E874 Mixed disorder of acid-base balance: Secondary | ICD-10-CM | POA: Diagnosis present

## 2023-12-05 DIAGNOSIS — R579 Shock, unspecified: Secondary | ICD-10-CM | POA: Diagnosis present

## 2023-12-05 DIAGNOSIS — L89151 Pressure ulcer of sacral region, stage 1: Secondary | ICD-10-CM | POA: Diagnosis not present

## 2023-12-05 DIAGNOSIS — Z8249 Family history of ischemic heart disease and other diseases of the circulatory system: Secondary | ICD-10-CM | POA: Diagnosis not present

## 2023-12-05 DIAGNOSIS — J9602 Acute respiratory failure with hypercapnia: Secondary | ICD-10-CM

## 2023-12-05 DIAGNOSIS — Z681 Body mass index (BMI) 19 or less, adult: Secondary | ICD-10-CM | POA: Diagnosis not present

## 2023-12-05 DIAGNOSIS — I351 Nonrheumatic aortic (valve) insufficiency: Secondary | ICD-10-CM | POA: Diagnosis present

## 2023-12-05 DIAGNOSIS — I959 Hypotension, unspecified: Secondary | ICD-10-CM

## 2023-12-05 DIAGNOSIS — E876 Hypokalemia: Secondary | ICD-10-CM

## 2023-12-05 DIAGNOSIS — R54 Age-related physical debility: Secondary | ICD-10-CM | POA: Diagnosis present

## 2023-12-05 DIAGNOSIS — Z79899 Other long term (current) drug therapy: Secondary | ICD-10-CM | POA: Diagnosis not present

## 2023-12-05 DIAGNOSIS — J9622 Acute and chronic respiratory failure with hypercapnia: Secondary | ICD-10-CM

## 2023-12-05 DIAGNOSIS — B182 Chronic viral hepatitis C: Secondary | ICD-10-CM | POA: Diagnosis present

## 2023-12-05 DIAGNOSIS — Z881 Allergy status to other antibiotic agents status: Secondary | ICD-10-CM | POA: Diagnosis not present

## 2023-12-05 DIAGNOSIS — G8929 Other chronic pain: Secondary | ICD-10-CM | POA: Diagnosis present

## 2023-12-05 DIAGNOSIS — J9621 Acute and chronic respiratory failure with hypoxia: Secondary | ICD-10-CM | POA: Diagnosis present

## 2023-12-05 DIAGNOSIS — I21A1 Myocardial infarction type 2: Secondary | ICD-10-CM | POA: Diagnosis present

## 2023-12-05 DIAGNOSIS — E785 Hyperlipidemia, unspecified: Secondary | ICD-10-CM | POA: Diagnosis present

## 2023-12-05 LAB — GASTROINTESTINAL PANEL BY PCR, STOOL (REPLACES STOOL CULTURE)

## 2023-12-05 LAB — BASIC METABOLIC PANEL WITH GFR
Anion gap: 15 (ref 5–15)
BUN: 14 mg/dL (ref 8–23)
CO2: 23 mmol/L (ref 22–32)
Calcium: 8.1 mg/dL — ABNORMAL LOW (ref 8.9–10.3)
Chloride: 102 mmol/L (ref 98–111)
Creatinine, Ser: 0.82 mg/dL (ref 0.44–1.00)
GFR, Estimated: >60 mL/min (ref >60)
Glucose, Bld: 139 mg/dL — ABNORMAL HIGH (ref 70–99)
Potassium: 3.4 mmol/L — ABNORMAL LOW (ref 3.5–5.1)
Sodium: 140 mmol/L (ref 135–145)

## 2023-12-05 LAB — ECHOCARDIOGRAM COMPLETE
AR max vel: 2.09 cm2
AV Area VTI: 1.98 cm2
AV Area mean vel: 1.95 cm2
AV Mean grad: 6 mmHg
AV Peak grad: 13.5 mmHg
Ao pk vel: 1.84 m/s
Area-P 1/2: 3.54 cm2
Est EF: 55
Height: 62 in
S' Lateral: 3.3 cm
Weight: 1504 [oz_av]

## 2023-12-05 LAB — MRSA NEXT GEN BY PCR, NASAL: MRSA by PCR Next Gen: NOT DETECTED

## 2023-12-05 LAB — C DIFFICILE QUICK SCREEN W PCR REFLEX
C Diff antigen: NEGATIVE
C Diff interpretation: NOT DETECTED
C Diff toxin: NEGATIVE

## 2023-12-05 LAB — CBC
HCT: 38.5 % (ref 36.0–46.0)
Hemoglobin: 12.6 g/dL (ref 12.0–15.0)
MCH: 32 pg (ref 26.0–34.0)
MCHC: 32.7 g/dL (ref 30.0–36.0)
MCV: 97.7 fL (ref 80.0–100.0)
Platelets: 328 K/uL (ref 150–400)
RBC: 3.94 MIL/uL (ref 3.87–5.11)
RDW: 15 % (ref 11.5–15.5)
WBC: 6.1 K/uL (ref 4.0–10.5)
nRBC: 0 % (ref 0.0–0.2)

## 2023-12-05 LAB — RESP PANEL BY RT-PCR (RSV, FLU A&B, COVID)  RVPGX2
Influenza A by PCR: NEGATIVE
Influenza B by PCR: NEGATIVE
Resp Syncytial Virus by PCR: NEGATIVE
SARS Coronavirus 2 by RT PCR: NEGATIVE

## 2023-12-05 LAB — MAGNESIUM: Magnesium: 2.4 mg/dL (ref 1.7–2.4)

## 2023-12-05 LAB — TROPONIN I (HIGH SENSITIVITY): Troponin I (High Sensitivity): 801 ng/L (ref <18)

## 2023-12-05 LAB — PHOSPHORUS: Phosphorus: 5.1 mg/dL — ABNORMAL HIGH (ref 2.5–4.6)

## 2023-12-05 MED ORDER — PIPERACILLIN-TAZOBACTAM 3.375 G IVPB
3.3750 g | Freq: Three times a day (TID) | INTRAVENOUS | Status: DC
  Administered 2023-12-05 (×2): 3.375 g via INTRAVENOUS
  Filled 2023-12-05 (×2): qty 50

## 2023-12-05 MED ORDER — ALBUTEROL SULFATE (2.5 MG/3ML) 0.083% IN NEBU: 2.5000 mg | INHALATION_SOLUTION | RESPIRATORY_TRACT | Status: DC | PRN

## 2023-12-05 MED ORDER — FUROSEMIDE 10 MG/ML IJ SOLN: 20.0000 mg | Freq: Two times a day (BID) | INTRAMUSCULAR | Status: DC

## 2023-12-05 MED ORDER — PREDNISONE 20 MG PO TABS
50.0000 mg | ORAL_TABLET | Freq: Every day | ORAL | Status: DC
  Administered 2023-12-06 – 2023-12-09 (×4): 50 mg via ORAL
  Filled 2023-12-05 (×4): qty 1

## 2023-12-05 MED ORDER — CHLORHEXIDINE GLUCONATE CLOTH 2 % EX PADS: 6.0000 | MEDICATED_PAD | Freq: Every day | CUTANEOUS | Status: DC

## 2023-12-05 MED ORDER — HEPARIN BOLUS VIA INFUSION
1000.0000 [IU] | Freq: Once | INTRAVENOUS | Status: AC
  Administered 2023-12-05: 1000 [IU] via INTRAVENOUS
  Filled 2023-12-05: qty 1000

## 2023-12-05 MED ORDER — ACETAMINOPHEN 325 MG PO TABS
650.0000 mg | ORAL_TABLET | Freq: Four times a day (QID) | ORAL | Status: DC | PRN
  Administered 2023-12-06 – 2023-12-08 (×3): 650 mg via ORAL
  Filled 2023-12-05 (×3): qty 2

## 2023-12-05 MED ORDER — POTASSIUM CHLORIDE CRYS ER 10 MEQ PO TBCR
10.0000 meq | EXTENDED_RELEASE_TABLET | Freq: Two times a day (BID) | ORAL | Status: AC
  Administered 2023-12-05 – 2023-12-06 (×4): 10 meq via ORAL
  Filled 2023-12-05 (×4): qty 1

## 2023-12-05 MED ORDER — POLYETHYLENE GLYCOL 3350 17 G PO PACK: 17.0000 g | PACK | Freq: Every day | ORAL | Status: DC | PRN

## 2023-12-05 MED ORDER — DOCUSATE SODIUM 100 MG PO CAPS: 100.0000 mg | ORAL_CAPSULE | Freq: Two times a day (BID) | ORAL | Status: DC | PRN

## 2023-12-05 MED ORDER — OXYCODONE HCL 5 MG PO TABS
5.0000 mg | ORAL_TABLET | Freq: Four times a day (QID) | ORAL | Status: DC | PRN
  Administered 2023-12-05 – 2023-12-09 (×11): 5 mg via ORAL
  Filled 2023-12-05 (×11): qty 1

## 2023-12-05 MED ORDER — HEPARIN SODIUM (PORCINE) 5000 UNIT/ML IJ SOLN
5000.0000 [IU] | Freq: Three times a day (TID) | INTRAMUSCULAR | Status: DC
  Administered 2023-12-05 – 2023-12-08 (×10): 5000 [IU] via SUBCUTANEOUS
  Filled 2023-12-05 (×10): qty 1

## 2023-12-05 MED ORDER — ASPIRIN 81 MG PO TBEC
81.0000 mg | DELAYED_RELEASE_TABLET | Freq: Every day | ORAL | Status: DC
  Administered 2023-12-06 – 2023-12-09 (×4): 81 mg via ORAL
  Filled 2023-12-05 (×5): qty 1

## 2023-12-05 MED ORDER — POTASSIUM CHLORIDE CRYS ER 20 MEQ PO TBCR
40.0000 meq | EXTENDED_RELEASE_TABLET | Freq: Once | ORAL | Status: AC
  Administered 2023-12-05: 40 meq via ORAL
  Filled 2023-12-05 (×2): qty 2

## 2023-12-05 MED ORDER — FUROSEMIDE 10 MG/ML IJ SOLN
40.0000 mg | Freq: Two times a day (BID) | INTRAMUSCULAR | Status: DC
Start: 2023-12-05 — End: 2023-12-07
  Administered 2023-12-05 – 2023-12-06 (×3): 40 mg via INTRAVENOUS
  Filled 2023-12-05 (×3): qty 4

## 2023-12-05 MED ORDER — IPRATROPIUM-ALBUTEROL 0.5-2.5 (3) MG/3ML IN SOLN
3.0000 mL | Freq: Four times a day (QID) | RESPIRATORY_TRACT | Status: DC
  Administered 2023-12-05 – 2023-12-06 (×4): 3 mL via RESPIRATORY_TRACT
  Filled 2023-12-05 (×4): qty 3

## 2023-12-05 MED ORDER — HEPARIN (PORCINE) 25000 UT/250ML-% IV SOLN
750.0000 [IU]/h | INTRAVENOUS | Status: DC
  Administered 2023-12-05: 750 [IU]/h via INTRAVENOUS
  Filled 2023-12-05: qty 250

## 2023-12-05 MED ORDER — ROSUVASTATIN CALCIUM 5 MG PO TABS
10.0000 mg | ORAL_TABLET | Freq: Every day | ORAL | Status: DC
  Administered 2023-12-05 – 2023-12-09 (×5): 10 mg via ORAL
  Filled 2023-12-05 (×5): qty 2

## 2023-12-05 MED ORDER — SODIUM CHLORIDE 0.9% FLUSH: 10.0000 mL | INTRAVENOUS | Status: DC | PRN

## 2023-12-05 MED ORDER — FENTANYL CITRATE (PF) 50 MCG/ML IJ SOSY
25.0000 ug | PREFILLED_SYRINGE | INTRAMUSCULAR | Status: DC | PRN
  Administered 2023-12-05 (×3): 25 ug via INTRAVENOUS
  Filled 2023-12-05 (×3): qty 1

## 2023-12-05 NOTE — H&P (Addendum)
 NAME:  Wendy [name], MRN:  [id number], DOB:  November 08, 1955, LOS: 0 ADMISSION DATE:  12/04/2023, CONSULTATION DATE:  11/21 REFERRING MD:  Dr. Gennaro CHIEF COMPLAINT:  Respiratory distress   History of Present Illness:  68 year old female with PMH as below, which is significant for adenocarcinoma of the colon s/p resection and radiation. She is a drinker and smoker with has history of hepatitis C. She was recently admitted to the hospitalist service for HFrEF with concern for Takotsubo cardiomyopathy. She was diuresed and discharged on GDMT with improvement 11/3. 11/20 she again presented to [facility] ED with complaints of sudden onset shortness of breath. She deteriorated en route to ED with EMS and was placed on BiPAP. Workup in the emergency department included CT angiogram chest, which was negative for PE, but was concerning for pulmonary edema and volume overload. She was treated for COPD exacerbation and was also given 80mg  IV lasix  with markedly improved respiratory status. Shortly thereafter she became slightly hypotensive and was started on NE infusion. PCCM is asked to admit due to the pressor requirement.   Pertinent  Medical History   has a past medical history of Adenocarcinoma of colon (HCC) (07/2007), Alcohol dependency (HCC), Allergy, Anxiety, Hep C w/o coma, chronic (HCC), Hepatitis C, Herpes genitalia, History of DVT (deep vein thrombosis) (12/2007), HSV-1 (herpes simplex virus 1) infection, HSV-2 (herpes simplex virus 2) infection, Hypertension, Migraine headache, and Rectal cancer (HCC).   Significant Hospital Events: Including procedures, antibiotic start and stop dates in addition to other pertinent events   11/20 admit to ICU  Interim History / Subjective:    Objective    Blood pressure 93/63, pulse 63, temperature (!) 96.6 F (35.9 C), temperature source Axillary, resp. rate 20, height 5' 2 (1.575 m), weight 42.6 kg, last menstrual period 01/03/1995, SpO2 98%.     Vent Mode: PSV;BIPAP FiO2 (%):  [60 %-100 %] 60 % PEEP:  [8 cmH20] 8 cmH20 Pressure Support:  [8 cmH20] 8 cmH20  No intake or output data in the 24 hours ending 12/05/23 0006 Filed Weights   12/04/23 2330  Weight: 42.6 kg    Examination: General: Elderly appearing female, very thin, in no distress HENT: [place]/AT, PERRL, no VJD Lungs: Diminished bases, no wheeze. Unlabored and satting well on  Cardiovascular: RRR, no MRG Abdomen: Soft, mild generalized tenderness.  Extremities: No acute deformity. No edema Neuro: Alert, oriented, non-focal   Resolved problem list   Assessment and Plan   Hypotension, possible shock: LA 7.5 rapidly cleared to 2.1 prior to hypotensive episode. No other evidence of end organ dysfunction. Requiring 2mcg NE to keep SBP > 90. She has had 2 days of watery diarrhea and intermittent gas pain, but there is evidence of pulmonary edema on her CT chest  with BNP 1587 so its difficult to get a sense of her volume status.  - Admit to ICU for monitoring - NE infusion for MAP 65 and SBP 90 - Zosyn  for now - Cultures - Bedside echo with LV function improved from prior echo.  - Consider CT abdomen/pelvis - Check C dif, GI panel  Acute respiratory failure with hypercarbia -improved Possible COPD exacerbation Pulmonary edema - Supplemental O2 - Duonebs scheduled - Steroids - lasix  given in ED, but now hypotensive - Check CXR in AM - PRN BiPAP  Acute on chronic HFrEF - bedside echo looked better - holding GDMT in the setting of hypotension - will need to repeat formal echo  Troponin elevation -  appreciate cardiology evaluation - Treating as demand ischemia per cards - DC heparin  infusion  Hypokalemia - replace K  History of colon cancer - remote - supportive care  Hepatitis C - with remote IV drug history - unclear if treated - trend LFT    Labs   CBC: Recent Labs  Lab 12/04/23 1840 12/04/23 1844 12/04/23 1847  WBC 8.1  --   --    NEUTROABS 4.8  --   --   HGB 13.2 14.3 13.6  HCT 41.9 42.0 40.0  MCV 101.7*  --   --   PLT 353  --   --     Basic Metabolic Panel: Recent Labs  Lab 12/04/23 1840 12/04/23 1844 12/04/23 1847  NA 139 138 137  K 3.3* 3.2* 3.5  CL 104  --   --   CO2 17*  --   --   GLUCOSE 225*  --   --   BUN 11  --   --   CREATININE 0.92  --   --   CALCIUM  8.5*  --   --    GFR: Estimated Creatinine Clearance: 40.5 mL/min (by C-G formula based on SCr of 0.92 mg/dL). Recent Labs  Lab 12/04/23 1840 12/04/23 1851 12/04/23 2108  WBC 8.1  --   --   LATICACIDVEN  --  7.5* 2.1*    Liver Function Tests: No results for input(s): AST, ALT, ALKPHOS, BILITOT, PROT, ALBUMIN in the last 168 hours. No results for input(s): LIPASE, AMYLASE in the last 168 hours. No results for input(s): AMMONIA in the last 168 hours.  ABG    Component Value Date/Time   PHART 7.180 (LL) 12/04/2023 1847   PCO2ART 48.9 (H) 12/04/2023 1847   PO2ART 198 (H) 12/04/2023 1847   HCO3 23.4 12/04/2023 2140   TCO2 20 (L) 12/04/2023 1847   ACIDBASEDEF 4.0 (H) 12/04/2023 2140   O2SAT 85.1 12/04/2023 2140     Coagulation Profile: Recent Labs  Lab 12/04/23 1840  INR 1.1    Cardiac Enzymes: No results for input(s): CKTOTAL, CKMB, CKMBINDEX, TROPONINI in the last 168 hours.  HbA1C: Hgb A1c MFr Bld  Date/Time Value Ref Range Status  11/14/2023 02:10 AM 5.0 4.8 - 5.6 % Final    Comment:    (NOTE) Diagnosis of Diabetes The following HbA1c ranges recommended by the American Diabetes Association (ADA) may be used as an aid in the diagnosis of diabetes mellitus.  Hemoglobin             Suggested A1C NGSP%              Diagnosis  <5.7                   Non Diabetic  5.7-6.4                Pre-Diabetic  >6.4                   Diabetic  <7.0                   Glycemic control for                       adults with diabetes.      CBG: No results for input(s): GLUCAP in the last 168  hours.  Review of Systems:   Bolds are positive  Constitutional: weight loss, gain, night sweats, Fevers, chills, fatigue .  HEENT: headaches, Sore throat,  sneezing, nasal congestion, post nasal drip, Difficulty swallowing, Tooth/dental problems, visual complaints visual changes, ear ache CV:  chest pain, radiates:,Orthopnea, PND, swelling in lower extremities, dizziness, palpitations, syncope.  GI  heartburn, indigestion, abdominal pain, nausea, vomiting, diarrhea, change in bowel habits, loss of appetite, bloody stools.  Resp: cough, productive: , hemoptysis, dyspnea, chest pain, pleuritic.  Skin: rash or itching or icterus GU: dysuria, change in color of urine, urgency or frequency. flank pain, hematuria  MS: joint pain or swelling. decreased range of motion  Psych: change in mood or affect. depression or anxiety.  Neuro: difficulty with speech, weakness, numbness, ataxia    Past Medical History:  She,  has a past medical history of Adenocarcinoma of colon (HCC) (07/2007), Alcohol dependency (HCC), Allergy, Anxiety, Hep C w/o coma, chronic (HCC), Hepatitis C, Herpes genitalia, History of DVT (deep vein thrombosis) (12/2007), HSV-1 (herpes simplex virus 1) infection, HSV-2 (herpes simplex virus 2) infection, Hypertension, Migraine headache, and Rectal cancer (HCC).   Surgical History:   Past Surgical History:  Procedure Laterality Date   ABDOMINAL HYSTERECTOMY  1996   With bilateral oophorectomy.   APPENDECTOMY  1996   BLADDER REPAIR     CLOSED REDUCTION NASAL FRACTURE N/A 10/12/2012   Procedure: CLOSED REDUCTION NASAL BONE FRACTURE WITH STABILZATION ;  Surgeon: Lonni LITTIE Sax, DDS;  Location: [facility] OR;  Service: Oral Surgery;  Laterality: N/A;   COLON SURGERY     COLONOSCOPY W/ BIOPSIES AND POLYPECTOMY     Hx; of   TONSILLECTOMY     TONSILLECTOMY AND ADENOIDECTOMY       Social History:   reports that she has been smoking cigarettes. She has never used smokeless tobacco. She  reports current alcohol use of about 20.0 standard drinks of alcohol per week. She reports that she does not use drugs.   Family History:  Her family history includes Cancer in her father and another family member; Colon cancer in her father and mother; Hyperparathyroidism in her mother; Hypertension in her mother.   Allergies Allergies  Allergen Reactions   Amoxicillin Rash   Vancomycin Itching    Erythema and mild itching -  Resolved with IV Benadryl .   Erythromycin Base Hives and Other (See Comments)    Pt states it makes her shaky.     Home Medications  Prior to Admission medications   Medication Sig Start Date End Date Taking? Authorizing Provider  acetaminophen  (TYLENOL ) 325 MG tablet Take 650 mg by mouth every 6 (six) hours as needed for mild pain (pain score 1-3) or headache.    [provider]  albuterol  (VENTOLIN  HFA) 108 (90 Base) MCG/ACT inhaler Inhale 2 puffs into the lungs every 6 (six) hours as needed for wheezing or shortness of breath. 11/17/23   Singh, Prashant K, MD  aspirin  EC 81 MG tablet Take 1 tablet (81 mg total) by mouth daily. Swallow whole. 11/17/23   Singh, Prashant K, MD  carvedilol  (COREG ) 3.125 MG tablet Take 1 tablet (3.125 mg total) by mouth 2 (two) times daily with a meal. 11/17/23   Dennise Lavada POUR, MD  doxycycline  (VIBRA -TABS) 100 MG tablet Take 1 tablet (100 mg total) by mouth every 12 (twelve) hours. 11/17/23   Singh, Prashant K, MD  furosemide  (LASIX ) 40 MG tablet Take 0.5 tablets (20 mg total) by mouth daily as needed for fluid or edema. 11/17/23 11/16/24  Singh, Prashant K, MD  isosorbide  mononitrate (IMDUR ) 30 MG 24 hr tablet Take 1 tablet (30 mg total) by mouth  daily. 11/17/23   Singh, Prashant K, MD  lisinopril  (ZESTRIL ) 5 MG tablet Take 1 tablet (5 mg total) by mouth daily. 11/17/23   Singh, Prashant K, MD  pantoprazole  (PROTONIX ) 40 MG tablet Take 1 tablet (40 mg total) by mouth daily. 11/17/23   Singh, Prashant K, MD  rosuvastatin  (CRESTOR )  10 MG tablet Take 1 tablet (10 mg total) by mouth daily. 11/17/23   Dennise Lavada POUR, MD     Critical care time: 43 minutes     Deward Eastern, AGACNP-BC [facility]  See Amion for personal pager PCCM on call pager [phone] until 7pm. Please call Elink 7p-7a. [phone]  12/05/2023 12:38 AM

## 2023-12-05 NOTE — Progress Notes (Addendum)
 NAME:  Wendy [name], MRN:  [id number], DOB:  06/10/1955, LOS: 0 ADMISSION DATE:  12/04/2023, CONSULTATION DATE:  11/21 REFERRING MD:  Dr. Gennaro CHIEF COMPLAINT:  Respiratory distress   History of Present Illness:  68 year old female with PMH as below, which is significant for adenocarcinoma of the colon s/p resection and radiation. She is a drinker and smoker with has history of hepatitis C. She was recently admitted to the hospitalist service for HFrEF with concern for Takotsubo cardiomyopathy. She was diuresed and discharged on GDMT with improvement 11/3. 11/20 she again presented to [facility] ED with complaints of sudden onset shortness of breath. She deteriorated en route to ED with EMS and was placed on BiPAP. Workup in the emergency department included CT angiogram chest, which was negative for PE, but was concerning for pulmonary edema and volume overload. She was treated for COPD exacerbation and was also given 80mg  IV lasix  with markedly improved respiratory status. Shortly thereafter she became slightly hypotensive and was started on NE infusion. PCCM is asked to admit due to the pressor requirement.   Pertinent  Medical History   has a past medical history of Adenocarcinoma of colon (HCC) (07/2007), Alcohol dependency (HCC), Allergy, Anxiety, Hep C w/o coma, chronic (HCC), Hepatitis C, Herpes genitalia, History of DVT (deep vein thrombosis) (12/2007), HSV-1 (herpes simplex virus 1) infection, HSV-2 (herpes simplex virus 2) infection, Hypertension, Migraine headache, and Rectal cancer (HCC).  Significant Hospital Events: Including procedures, antibiotic start and stop dates in addition to other pertinent events   11/20 presented with signs of flash pulmonary edema treated with BiPAP and aggressive IV diuretics with improvement in respiratory distress but later resulted in hypotension requiring initiation of vasopressor support 11/21 remains stable and weaned off vasopressor support  this morning  Interim History / Subjective:  Seen sitting up in bed with no acute complaints  Objective    Blood pressure 110/66, pulse 61, temperature (!) 97 F (36.1 C), temperature source Temporal, resp. rate 17, height 5' 2 (1.575 m), weight 42.6 kg, last menstrual period 01/03/1995, SpO2 99%.    Vent Mode: PSV;BIPAP FiO2 (%):  [60 %-100 %] 60 % PEEP:  [8 cmH20] 8 cmH20 Pressure Support:  [8 cmH20] 8 cmH20   Intake/Output Summary (Last 24 hours) at 12/05/2023 9093 Last data filed at 12/05/2023 0827 Gross per 24 hour  Intake 57.01 ml  Output --  Net 57.01 ml   Filed Weights   12/04/23 2330  Weight: 42.6 kg    Examination: General Acute on chronic thin middle-aged female lying in bed in no acute distress HEENT: [place]/AT, MM pink/moist, PERRL,  Neuro: Alert and oriented x 3, nonfocal CV: s1s2 regular rate and rhythm, no murmur, rubs, or gallops,  PULM: Slightly diminished bilaterally, no increased work of breathing, on 2 L nasal cannula GI: soft, bowel sounds active in all 4 quadrants, non-tender, non-distended Extremities: warm/dry, no edema  Skin: no rashes or lesions   Resolved problem list    Assessment and Plan  Acute respiratory failure with hypercarbia -improved Possible COPD exacerbation Pulmonary edema - CTA chest with moderate pulmonary edema small bilateral pleural effusions P: Continue supplemental oxygen as needed for sat goal greater than 90% Scheduled bronchodilators Continue steroids Daily assessment for need to continue to diurese As needed BiPAP  Aspiration precautions Encourage pulmonary hygiene  Hypotension, possible shock - Lactic acid on admission significantly elevated at 7.5 but rapidly cleared to 2.1.  Low suspicion for infection/septic component and it  is more likely that hypotension worsened due to aggressive diuretic for management of pulmonary edema with history of aggressive hemodynamic control for heart failure as below.  A.m.  11/21 vasopressor support has been weaned off P: Again no signs of active infection on admission this appears more volume related due to CHF as opposed to septic shock Stop antibiotics and trend CBC and fever curve Slowly resume home antihypertensive regiment as able Continuous telemetry Goal of normovolemia Follow-up echo  Acute on chronic HFrEF -Echocardiogram 11/21 with EF of 55%, small area of inferior basal akinesis, and normal RV function NSTEMI P: Cardiology following, appreciate assistance ASA and statin Heart health diet with sodium restriction when able  Strict intake and output  Daily weight to assess volume status Daily assessment for need to diurese Closely monitor renal function and electrolytes  Ensure hemodynamic control  Same home GDMT as able  Diarrhea, reported prior to admission -GI panel and C. difficile negative P: No ongoing indications for antibiotics at this time As needed Imodium   Hypokalemia P: Supplement Trend  History of colon cancer  - Remote P: Supportive care  Hepatitis C  -With remote IV drug history, unclear if treated P: LFTs Avoid hepatotoxins  Patient remains hemodynamically stable a.m. 11/21 now off vasopressor support.  Will downgrade admission bed to progressive and ask hospitalist to take over starting 11/22  Critical care time:   Tanish Sinkler D. Harris, NP-C [facility] Personal contact information can be found on Amion  If no contact or response made please call [phone] 12/05/2023, 12:07 PM

## 2023-12-05 NOTE — ED Notes (Signed)
 RN changed pt brief, linens, chucks, and gown. Pericare provided

## 2023-12-05 NOTE — ED Notes (Signed)
 Pt right PIV has slight redness to site. Pt currently has Levophed  infusing. RN notified IV team to place an USIV.

## 2023-12-05 NOTE — Progress Notes (Signed)
  Echocardiogram 2D Echocardiogram has been performed.  Charmaine KANDICE Gaskins 12/05/2023, 11:46 AM

## 2023-12-05 NOTE — ED Notes (Signed)
 NT changed pt brief

## 2023-12-05 NOTE — Progress Notes (Signed)
  Progress Note  Patient Name: Wendy [name] Date of Encounter: 12/05/2023 [facility] Cardiologist: Lonni Cash, MD   Interval Summary    Breathing is better this morning, no chest pain prior to or during admission.   Vital Signs Vitals:   12/05/23 0800 12/05/23 0815 12/05/23 0845 12/05/23 0900  BP: 123/64 110/66 106/63 105/63  Pulse: 69 61 (!) 59 (!) 58  Resp: 17 17 16 15   Temp:      TempSrc:      SpO2: 97% 99% 91% 92%  Weight:      Height:        Intake/Output Summary (Last 24 hours) at 12/05/2023 0936 Last data filed at 12/05/2023 0827 Gross per 24 hour  Intake 57.01 ml  Output --  Net 57.01 ml      12/04/2023   11:30 PM 11/14/2023    3:15 PM 06/23/2014    2:32 PM  Last 3 Weights  Weight (lbs) 94 lb 94 lb 104 lb 3.2 oz  Weight (kg) 42.638 kg 42.638 kg 47.265 kg      Telemetry/ECG  Sinus Rhythm, PVCs - Personally Reviewed  Physical Exam  GEN: Thin, frail older female, sitting up in bed. No distress   Neck: No JVD Cardiac: RRR, 2/6 murmur LLSB, no rubs, or gallops.  Respiratory: mild wheeze on expiration  GI: Soft, nontender, non-distended  MS: No edema  Assessment & Plan  68 y.o. female with a hx of rectal cancer status post resection, adjuvant therapy, external radiation, DVT previously on Coumadin, tobacco use, hepatitis C, COPD, heart failure with moderately reduced ejection fraction, coronary artery calcification, hypertension, hyperlipidemia who was seen 12/05/2023 for the evaluation of NSTEMI at the request of ED.   NSTEMI -- presented with acute respiratory failure in the setting of possible COPD exacerbation with continued tobacco use -- found to have elevated hsTn [id number], EKG w/diffuse anterolateral ST depression and TWI with repeat showing TWI in v5-v6 -- denies any chest pain -- previously seen 10/31 and treated medically -- suspect she does have some degree of CAD, but would favor medical therapy for now -- add ASA  81mg  daily, consider addition of plavix, statin   Hypotension Elevated lactic acid Shock  -- initial lactic acid 7.5>>2.1  -- given IV lasix , then developed hypotension regarding low dose NE. Now weaned  Acute respiratory failure with hypercarbia COPD -- per PCCM  Acute on chronic HFmrEF -- echo 10/31 LVEF 45%, normal RV, small pericardial effusion -- CT chest with pulmonary edema, small bilateral pleural effusions . BNP 1587 -- s/p IV lasix  80, as above then developed hypotension -- hold on further diuresis for now -- GDMT: held for now in the setting of hypotension  -- repeat echo pending   For questions or updates, please contact [facility] Please consult [email or web address] for contact info under    Signed, Manuelita Rummer, NP

## 2023-12-05 NOTE — ED Notes (Signed)
 RN contacted [facility] to get pain medicine for flank pain

## 2023-12-05 NOTE — ED Notes (Signed)
 Levophed  switched to pt 20g left forearm.  18g right ac saline locked

## 2023-12-05 NOTE — ED Notes (Signed)
 RN changed pt brief, peri-care, and new linen

## 2023-12-05 NOTE — ED Notes (Signed)
Pt slid up in bed.  

## 2023-12-06 ENCOUNTER — Inpatient Hospital Stay (HOSPITAL_COMMUNITY)

## 2023-12-06 DIAGNOSIS — R579 Shock, unspecified: Secondary | ICD-10-CM | POA: Diagnosis not present

## 2023-12-06 LAB — COMPREHENSIVE METABOLIC PANEL WITH GFR
ALT: 18 U/L (ref 0–44)
AST: 28 U/L (ref 15–41)
Albumin: 2.8 g/dL — ABNORMAL LOW (ref 3.5–5.0)
Alkaline Phosphatase: 32 U/L — ABNORMAL LOW (ref 38–126)
Anion gap: 12 (ref 5–15)
BUN: 15 mg/dL (ref 8–23)
CO2: 27 mmol/L (ref 22–32)
Calcium: 8.3 mg/dL — ABNORMAL LOW (ref 8.9–10.3)
Chloride: 96 mmol/L — ABNORMAL LOW (ref 98–111)
Creatinine, Ser: 0.89 mg/dL (ref 0.44–1.00)
GFR, Estimated: >60 mL/min (ref >60)
Glucose, Bld: 104 mg/dL — ABNORMAL HIGH (ref 70–99)
Potassium: 3.6 mmol/L (ref 3.5–5.1)
Sodium: 135 mmol/L (ref 135–145)
Total Bilirubin: 1.2 mg/dL (ref 0.0–1.2)
Total Protein: 6.1 g/dL — ABNORMAL LOW (ref 6.5–8.1)

## 2023-12-06 LAB — CBC
HCT: 33.4 % — ABNORMAL LOW (ref 36.0–46.0)
Hemoglobin: 11.5 g/dL — ABNORMAL LOW (ref 12.0–15.0)
MCH: 32 pg (ref 26.0–34.0)
MCHC: 34.4 g/dL (ref 30.0–36.0)
MCV: 93 fL (ref 80.0–100.0)
Platelets: 244 K/uL (ref 150–400)
RBC: 3.59 MIL/uL — ABNORMAL LOW (ref 3.87–5.11)
RDW: 15 % (ref 11.5–15.5)
WBC: 10.5 K/uL (ref 4.0–10.5)
nRBC: 0 % (ref 0.0–0.2)

## 2023-12-06 LAB — LACTIC ACID, PLASMA: Lactic Acid, Venous: 1.4 mmol/L (ref 0.5–1.9)

## 2023-12-06 NOTE — Evaluation (Signed)
 Physical Therapy Evaluation Patient Details Name: Wendy [name] MRN: [id number] DOB: 12-Nov-1955 Today's Date: 12/06/2023  History of Present Illness  The pt is a 68 yo female presenting 11/20 with respiratory distress. Admitted for management of acute pulmonary edema with NSTEMI and COPD exacerbation. PMH includes: rectal cancer status post resection, adjuvant therapy, and external radiation (in 2010), history of right internal jugular DVT (previously on Coumadin), hypertension, generalized anxiety disorder, current tobacco user.   Clinical Impression  Pt in bed upon arrival of PT, agreeable to evaluation at this time. Prior to admission the pt reports she was ambulating in her house holding furniture, with ~1 fall per month (however also reports 10 falls in last 6 months), and limited assistance from her son regarding IADLs. The pt required minA to complete sit-stand and transfers with RUE HHA (LUE unable to grip RW), and was limited to ~25 ft ambulation due to SpO2 to 87% on RA and pt report of feeling like knees are about to buckle. Given deficits in strength, power, stability, and endurance, recommend continued skilled PT acutely as well as continued inpatient therapies after d/c <3hours/day until pt more independent and able to return home with limited support.    If plan is discharge home, recommend the following: A little help with walking and/or transfers;A little help with bathing/dressing/bathroom;Assistance with cooking/housework;Direct supervision/assist for medications management;Direct supervision/assist for financial management;Assist for transportation;Help with stairs or ramp for entrance;Supervision due to cognitive status   Can travel by private vehicle   Yes    Equipment Recommendations None recommended by PT  Recommendations for Other Services       Functional Status Assessment Patient has had a recent decline in their functional status and demonstrates the ability to  make significant improvements in function in a reasonable and predictable amount of time.     Precautions / Restrictions Precautions Precautions: Fall Recall of Precautions/Restrictions: Impaired Precaution/Restrictions Comments: watch SpO2 Restrictions Weight Bearing Restrictions Per Provider Order: No      Mobility  Bed Mobility Overal bed mobility: Needs Assistance Bed Mobility: Supine to Sit     Supine to sit: HOB elevated, Used rails, Supervision     General bed mobility comments: supervision with use of bed features    Transfers Overall transfer level: Needs assistance Equipment used: 1 person hand held assist Transfers: Sit to/from Stand, Bed to chair/wheelchair/BSC Sit to Stand: Min assist   Step pivot transfers: Min assist       General transfer comment: minA through HHA for transfers, at times CGA but with inconsistent posterior lean and LOB needing miNA    Ambulation/Gait Ambulation/Gait assistance: Min assist Gait Distance (Feet): 25 Feet Assistive device: 1 person hand held assist Gait Pattern/deviations: Step-through pattern, Decreased step length - right, Decreased step length - left Gait velocity: decreased Gait velocity interpretation: <1.31 ft/sec, indicative of household ambulator   General Gait Details: pt reports weakness behind knees feeling like they wil buckle, no overt buckling in session. SpO2 to 87% briefly, upon seated rest returned to 91% on RA.    Balance Overall balance assessment: Needs assistance Sitting-balance support: No upper extremity supported, Feet supported Sitting balance-Leahy Scale: Fair   Postural control: Posterior lean Standing balance support: Single extremity supported, No upper extremity supported, During functional activity Standing balance-Leahy Scale: Fair Standing balance comment: at times minA to correct posterior LOB, but minA-CGA  Pertinent Vitals/Pain Pain  Assessment Pain Assessment: No/denies pain    Home Living Family/patient expects to be discharged to:: Private residence Living Arrangements: Alone Available Help at Discharge: Family;Available PRN/intermittently (son nexdoor, works during the day) Type of Home: Apartment Home Access: Stairs to enter Entrance Stairs-Rails: None Entrance Stairs-Number of Steps: 3   Home Layout: One level Home Equipment: Agricultural Consultant (2 wheels);Cane - single point      Prior Function Prior Level of Function : Independent/Modified Independent;History of Falls (last six months)             Mobility Comments: Does not use AD at baseline. State RW would not fit in bathroom. States L hand does not work and has to use cane in R if she uses it. Pt reports 10 falls in past 6 months. typically holding furniture ADLs Comments: Independent to Mod I for ADLs and light meal prep. Son assists with transportation and some home management tasks.     Extremity/Trunk Assessment   Upper Extremity Assessment Upper Extremity Assessment: Generalized weakness (chronic LUE deficits and limited ROM at hand)    Lower Extremity Assessment Lower Extremity Assessment: Generalized weakness (grossly 4-/5 to MMT)    Cervical / Trunk Assessment Cervical / Trunk Assessment: Kyphotic;Other exceptions Cervical / Trunk Exceptions: frail  Communication   Communication Communication: Impaired Factors Affecting Communication: Difficulty expressing self    Cognition Arousal: Alert Behavior During Therapy: WFL for tasks assessed/performed   PT - Cognitive impairments: Attention, Safety/Judgement                       PT - Cognition Comments: Pt with slow and effortful speech. Required slightly inc time to answer questions. Following commands: Impaired Following commands impaired: Only follows one step commands consistently, Follows multi-step commands inconsistently, Follows multi-step commands with increased  time     Cueing Cueing Techniques: Verbal cues, Gestural cues     General Comments General comments (skin integrity, edema, etc.): SpO2 91% on RA at rest, low of 87% with mobility and improved to 90s with seated rest.    Exercises     Assessment/Plan    PT Assessment Patient needs continued PT services  PT Problem List Decreased strength;Decreased range of motion;Decreased activity tolerance;Decreased balance;Decreased mobility;Decreased cognition;Decreased knowledge of use of DME;Decreased safety awareness;Impaired sensation       PT Treatment Interventions Gait training;Functional mobility training;Therapeutic activities;Therapeutic exercise;Balance training;Neuromuscular re-education;Cognitive remediation;Patient/family education;Manual techniques    PT Goals (Current goals can be found in the Care Plan section)  Acute Rehab PT Goals Patient Stated Goal: to get her strength back PT Goal Formulation: With patient Time For Goal Achievement: 12/20/23 Potential to Achieve Goals: Good    Frequency Min 2X/week        AM-PAC PT 6 Clicks Mobility  Outcome Measure Help needed turning from your back to your side while in a flat bed without using bedrails?: A Little Help needed moving from lying on your back to sitting on the side of a flat bed without using bedrails?: A Little Help needed moving to and from a bed to a chair (including a wheelchair)?: A Little Help needed standing up from a chair using your arms (e.g., wheelchair or bedside chair)?: A Little Help needed to walk in hospital room?: A Lot Help needed climbing 3-5 steps with a railing? : A Lot 6 Click Score: 16    End of Session Equipment Utilized During Treatment: Gait belt Activity Tolerance: Patient limited by fatigue Patient  left: in chair;with call bell/phone within reach;with chair alarm set Nurse Communication: Mobility status;Other (comment) (left on RA) PT Visit Diagnosis: Unsteadiness on feet  (R26.81);Other abnormalities of gait and mobility (R26.89);History of falling (Z91.81)    Time: 8661-8590 PT Time Calculation (min) (ACUTE ONLY): 31 min   Charges:   PT Evaluation $PT Eval Low Complexity: 1 Low PT Treatments $Therapeutic Exercise: 8-22 mins PT General Charges $$ ACUTE PT VISIT: 1 Visit         Izetta Call, PT, DPT   Acute Rehabilitation Department Office [phone] Secure Chat Communication Preferred  Izetta JULIANNA Call 12/06/2023, 3:50 PM

## 2023-12-06 NOTE — Plan of Care (Signed)

## 2023-12-06 NOTE — Evaluation (Signed)
 Occupational Therapy Evaluation Patient Details Name: Wendy [name] MRN: [id number] DOB: 1955/02/08 Today's Date: 12/06/2023   History of Present Illness   The pt is a 68 yo female presenting 11/20 with respiratory distress. Admitted for management of acute pulmonary edema with NSTEMI and COPD exacerbation. PMH includes: rectal cancer status post resection, adjuvant therapy, and external radiation (in 2010), history of right internal jugular DVT (previously on Coumadin), hypertension, generalized anxiety disorder, current tobacco user.     Clinical Impressions Pt admitted with the above. Pt currently with functional limitations due to the deficits listed below (see OT Problem List).  Pt will benefit from acute skilled OT to increase their safety and independence with ADL and functional mobility for ADL to facilitate discharge.    As far as AdL activity pt really needs increased A. Pt agrees and wants to get stronger in order to be more I.Pts son works and is not there at all times    If plan is discharge home, recommend the following:   A little help with walking and/or transfers;A little help with bathing/dressing/bathroom;Assistance with cooking/housework;Direct supervision/assist for medications management;Direct supervision/assist for financial management;Assist for transportation;Help with stairs or ramp for entrance     Functional Status Assessment   Patient has had a recent decline in their functional status and demonstrates the ability to make significant improvements in function in a reasonable and predictable amount of time.       Precautions/Restrictions   Precautions Precautions: Fall Recall of Precautions/Restrictions: Impaired Precaution/Restrictions Comments: watch SpO2 Restrictions Weight Bearing Restrictions Per Provider Order: No     Mobility Bed Mobility Overal bed mobility: Needs Assistance Bed Mobility: Supine to Sit     Supine to sit: HOB  elevated, Used rails, Supervision     General bed mobility comments: supervision with use of bed features    Transfers Overall transfer level: Needs assistance Equipment used: 1 person hand held assist Transfers: Sit to/from Stand, Bed to chair/wheelchair/BSC Sit to Stand: Min assist Stand pivot transfers: Min assist                Balance Overall balance assessment: Needs assistance Sitting-balance support: No upper extremity supported, Feet supported Sitting balance-Leahy Scale: Fair   Postural control: Posterior lean Standing balance support: Single extremity supported, No upper extremity supported, During functional activity Standing balance-Leahy Scale: Fair Standing balance comment: at times minA to correct posterior LOB, but minA-CGA                           ADL either performed or assessed with clinical judgement   ADL Overall ADL's : Needs assistance/impaired Eating/Feeding: Set up;Sitting   Grooming: Sitting;Set up   Upper Body Bathing: Sitting;Cueing for compensatory techniques   Lower Body Bathing: Moderate assistance;Maximal assistance;Cueing for safety;Cueing for compensatory techniques;Sit to/from stand   Upper Body Dressing : Minimal assistance;Sitting;Cueing for compensatory techniques   Lower Body Dressing: Moderate assistance;Sit to/from stand;Cueing for compensatory techniques;Cueing for safety   Toilet Transfer: Minimal assistance;Grab bars;BSC/3in1 (HHA +1 without an AD)   Toileting- Clothing Manipulation and Hygiene: Sitting/lateral lean;Sit to/from stand;Cueing for compensatory techniques;Moderate assistance       Functional mobility during ADLs: Moderate assistance (with a gait belt; HHA +1 without an AD) General ADL Comments: OT Aed pt with readjusting pure wick and suggested mesh panties to keep pure wick inplace     Vision Baseline Vision/History: 1 Wears glasses Patient Visual Report: No change from baseline;Other  (comment)  Vision Assessment?: No apparent visual deficits Eye Alignment: Within Functional Limits     Perception         Praxis         Pertinent Vitals/Pain Pain Assessment Pain Assessment: No/denies pain     Extremity/Trunk Assessment Upper Extremity Assessment Upper Extremity Assessment: LUE deficits/detail RUE Deficits / Details: very limited function LUE. Pt states it has gotten less helpful. RUE Sensation: WNL LUE Deficits / Details: generalized weakness with gross shoulder strength 2/5 and strength otherwise grossly 3/5; AROM shoulder flexion to approximately 80 degrees and PROM to approximately 95 degrees; elbow flextion mildly impaired, however, suspect due to IV placement; all other AROM WFL; increased flexor tone; decreased coordination; decreased proprioception; pt reports L UE deficits begining about a year ago and continuing to worsen over time.very limited function LUE. Pt states it has gotten less helpful. LUE Sensation: decreased proprioception LUE Coordination: decreased fine motor;decreased gross motor   Lower Extremity Assessment Lower Extremity Assessment: Generalized weakness (grossly 4-/5 to MMT)   Cervical / Trunk Assessment Cervical / Trunk Assessment: Normal Cervical / Trunk Exceptions: frail   Communication Communication Communication: Impaired Factors Affecting Communication: Difficulty expressing self   Cognition Arousal: Alert Behavior During Therapy: WFL for tasks assessed/performed                                         Cueing  General Comments      SpO2 91% on RA at rest, low of 87% with mobility and improved to 90s with seated rest.           Home Living Family/patient expects to be discharged to:: Private residence Living Arrangements: Alone Available Help at Discharge: Family;Available PRN/intermittently (son nexdoor, works during the day) Type of Home: Apartment Home Access: Stairs to enter [employer] of Steps: 3 Entrance Stairs-Rails: None Home Layout: One level     Bathroom Shower/Tub: Chief Strategy Officer: Standard Bathroom Accessibility: No   Home Equipment: Agricultural Consultant (2 wheels);Cane - single point          Prior Functioning/Environment Prior Level of Function : Independent/Modified Independent;History of Falls (last six months)             Mobility Comments: Does not use AD at baseline. State RW would not fit in bathroom. States L hand does not work and has to use cane in R if she uses it. Pt reports 10 falls in past 6 months. typically holding furniture ADLs Comments: Independent to Mod I for ADLs and light meal prep. Son assists with transportation and some home management tasks.    OT Problem List: Decreased strength;Decreased activity tolerance;Impaired balance (sitting and/or standing);Decreased range of motion;Decreased coordination;Decreased safety awareness;Decreased knowledge of use of DME or AE   OT Treatment/Interventions: Self-care/ADL training;Therapeutic exercise;Energy conservation;DME and/or AE instruction;Therapeutic activities;Patient/family education;Balance training      OT Goals(Current goals can be found in the care plan section)   Acute Rehab OT Goals Patient Stated Goal: get stronger OT Goal Formulation: With patient Time For Goal Achievement: 12/20/23 Potential to Achieve Goals: Good   OT Frequency:  Min 2X/week       AM-PAC OT 6 Clicks Daily Activity     Outcome Measure Help from another person eating meals?: A Little Help from another person taking care of personal grooming?: A Little Help from another person toileting, which includes using toliet,  bedpan, or urinal?: A Lot Help from another person bathing (including washing, rinsing, drying)?: A Lot Help from another person to put on and taking off regular upper body clothing?: A Little Help from another person to put on and taking off regular  lower body clothing?: A Lot 6 Click Score: 15   End of Session Equipment Utilized During Treatment: Gait belt Nurse Communication: Mobility status  Activity Tolerance: Patient tolerated treatment well Patient left: in bed;with call bell/phone within reach;with nursing/sitter in room  OT Visit Diagnosis: Unsteadiness on feet (R26.81);Repeated falls (R29.6);Muscle weakness (generalized) (M62.81);History of falling (Z91.81)                Time: 8389-8374 OT Time Calculation (min): 15 min Charges:  OT General Charges $OT Visit: 1 Visit OT Evaluation $OT Eval Low Complexity: 1 Low    Xandra Laramee, Norvel BIRCH 12/06/2023, 4:48 PM

## 2023-12-06 NOTE — Progress Notes (Signed)
 PROGRESS NOTE    Wendy [name]  FMW:[id number] DOB: 1955-01-18 DOA: 12/04/2023 PCP: System, Provider Not In   Brief Narrative:  68 year old female with PMH significant for adenocarcinoma of the colon s/p resection and radiation. She is a drinker and smoker with history of hepatitis C. She was recently admitted to the hospitalist service for HFrEF with concern for Takotsubo cardiomyopathy. She was diuresed and discharged on GDMT with improvement 11/3. 11/20 she again presented to [facility] ED with complaints of sudden onset shortness of breath. She deteriorated en route to ED with EMS and was placed on BiPAP. Workup in the emergency department included CT angiogram chest, which was negative for PE, but was concerning for pulmonary edema and volume overload. She was treated for COPD exacerbation and was also given 80mg  IV lasix  with markedly improved respiratory status. Shortly thereafter she became slightly hypotensive and was started on NE infusion.  Therefore she was admitted under PCCM team and ICU.  Eventually diuresed very well, weaned off of vasopressors and transferred to [facility] on 12/06/2023.  Assessment & Plan:   Principal Problem:   Shock (HCC) Active Problems:   Acute pulmonary edema (HCC)   Acute on chronic respiratory failure with hypercapnia (HCC)   Non-ST elevation (NSTEMI) myocardial infarction (HCC)  Acute respiratory failure with hypercarbia secondary to Acute pulmonary edema/acute on chronic congestive heart failure mrEF CTA chest with moderate pulmonary edema small bilateral pleural effusions, required BiPAP and route, has been weaned to 2 L of oxygen now after significant diuresis.  Echo shows 55% ejection fraction.  Cardiology has been consulted and managing.  Patient remains on IV diuresis.  Possible COPD exacerbation: Patient on prednisone  and bronchodilators.  Encouraged incentive spirometry.  Wean oxygen as able to.  No wheezes today.   Hypotension, possible  shock/lactic acidosis Lactic acid on admission significantly elevated at 7.5 but rapidly cleared to 2.1.  Low suspicion for infection/septic component and it is more likely that hypotension worsened due to aggressive diuretic for management of pulmonary edema with history of aggressive hemodynamic control for heart failure.  No signs of infection.  She is weaned off of the vasopressors 12/05/2023.  Blood pressure maintained.  Repeating lactic acid today.   NSTEMI: presented with acute respiratory failure in the setting of possible COPD exacerbation with continued tobacco use. found to have elevated hsTn [id number], EKG w/diffuse anterolateral ST depression and TWI with repeat showing TWI in v5-v6 Cardiology on board and suspect she does have some degree of CAD, but recommends medical therapy for now.  She is on aspirin .   Diarrhea, reported prior to admission -GI panel and C. difficile negative.  Diarrhea improved.   Hypokalemia: Resolved.   Hepatitis C  -With remote IV drug history, unclear if treated  Debility/generalized deconditioning: Patient appears to be extremely deconditioned.  Lives alone.  Suspect she is going to need to go to SNF.  PT OT evaluation requested.  DVT prophylaxis: heparin  injection 5,000 Units Start: 12/05/23 0600   Code Status: Full Code  Family Communication:  None present at bedside.  Plan of care discussed with patient in length and he/she verbalized understanding and agreed with it.  Status is: Inpatient Remains inpatient appropriate because: Still hypoxic and on IV diuresis.  Suspect she is going to require SNF placement.  PT OT is ordered.   Estimated body mass index is 17.94 kg/m as calculated from the following:   Height as of this encounter: 5' 2 (1.575 m).   Weight  as of this encounter: 44.5 kg.  Wound 12/05/23 1400 Pressure Injury Sacrum Left;Medial Stage 1 -  Intact skin with non-blanchable redness of a localized area usually over a bony prominence.  (Active)   Nutritional Assessment: Body mass index is 17.94 kg/m.[name] Seen by dietician.  I agree with the assessment and plan as outlined below: Nutrition Status:        . Skin Assessment: I have examined the patient's skin and I agree with the wound assessment as performed by the wound care RN as outlined below: Wound 12/05/23 1400 Pressure Injury Sacrum Left;Medial Stage 1 -  Intact skin with non-blanchable redness of a localized area usually over a bony prominence. (Active)    Consultants:  Cardiology  Procedures:  As above  Antimicrobials:  Anti-infectives (From admission, onward)    Start     Dose/Rate Route Frequency Ordered Stop   12/05/23 0200  piperacillin -tazobactam (ZOSYN ) IVPB 3.375 g  Status:  Discontinued        3.375 g 12.5 mL/hr over 240 Minutes Intravenous Every 8 hours 12/05/23 0047 12/05/23 1211   12/04/23 1845  cefTRIAXone  (ROCEPHIN ) 2 g in sodium chloride  0.9 % 100 mL IVPB        2 g 200 mL/hr over 30 Minutes Intravenous  Once 12/04/23 1840 12/04/23 2231   12/04/23 1845  azithromycin  (ZITHROMAX ) 500 mg in sodium chloride  0.9 % 250 mL IVPB        500 mg 250 mL/hr over 60 Minutes Intravenous  Once 12/04/23 1840 12/04/23 2109         Subjective: Patient seen and examined, other than low back pain, she has no other complaint.  She denied any chest pain, palpitation or shortness of breath or dizziness.  Objective: Vitals:   12/06/23 0355 12/06/23 0408 12/06/23 0757 12/06/23 0800  BP:  106/61  133/89  Pulse: (!) 58 (!) 58  77  Resp:  16  16  Temp:  98.7 F (37.1 C)  98.7 F (37.1 C)  TempSrc:  Oral  Oral  SpO2: 98% 98% 97% 95%  Weight: 44.5 kg     Height:        Intake/Output Summary (Last 24 hours) at 12/06/2023 0851 Last data filed at 12/06/2023 0409 Gross per 24 hour  Intake 660.01 ml  Output 2050 ml  Net -1389.99 ml   Filed Weights   12/04/23 2330 12/05/23 1303 12/06/23 0355  Weight: 42.6 kg 44.4 kg 44.5 kg     Examination:  General exam: Appears calm and comfortable  Respiratory system: Clear to auscultation. Respiratory effort normal. Cardiovascular system: S1 & S2 heard, RRR. No JVD, murmurs, rubs, gallops or clicks. No pedal edema. Gastrointestinal system: Abdomen is nondistended, soft and nontender. No organomegaly or masses felt. Normal bowel sounds heard. Central nervous system: Alert and oriented. No focal neurological deficits. Extremities: Symmetric 5 x 5 power. Skin: No rashes, lesions or ulcers Psychiatry: Judgement and insight appear normal. Mood & affect appropriate.    Data Reviewed: I have personally reviewed following labs and imaging studies  CBC: Recent Labs  Lab 12/04/23 1840 12/04/23 1844 12/04/23 1847 12/05/23 0122 12/06/23 0208  WBC 8.1  --   --  6.1 10.5  NEUTROABS 4.8  --   --   --   --   HGB 13.2 14.3 13.6 12.6 11.5*  HCT 41.9 42.0 40.0 38.5 33.4*  MCV 101.7*  --   --  97.7 93.0  PLT 353  --   --  328 244  Basic Metabolic Panel: Recent Labs  Lab 12/04/23 1840 12/04/23 1844 12/04/23 1847 12/05/23 0122 12/06/23 0208  NA 139 138 137 140 135  K 3.3* 3.2* 3.5 3.4* 3.6  CL 104  --   --  102 96*  CO2 17*  --   --  23 27  GLUCOSE 225*  --   --  139* 104*  BUN 11  --   --  14 15  CREATININE 0.92  --   --  0.82 0.89  CALCIUM  8.5*  --   --  8.1* 8.3*  MG  --   --   --  2.4  --   PHOS  --   --   --  5.1*  --    GFR: Estimated Creatinine Clearance: 43.7 mL/min (by C-G formula based on SCr of 0.89 mg/dL). Liver Function Tests: Recent Labs  Lab 12/06/23 0208  AST 28  ALT 18  ALKPHOS 32*  BILITOT 1.2  PROT 6.1*  ALBUMIN 2.8*   No results for input(s): LIPASE, AMYLASE in the last 168 hours. No results for input(s): AMMONIA in the last 168 hours. Coagulation Profile: Recent Labs  Lab 12/04/23 1840  INR 1.1   Cardiac Enzymes: No results for input(s): CKTOTAL, CKMB, CKMBINDEX, TROPONINI in the last 168 hours. BNP (last 3  results) No results for input(s): PROBNP in the last 8760 hours. HbA1C: No results for input(s): HGBA1C in the last 72 hours. CBG: No results for input(s): GLUCAP in the last 168 hours. Lipid Profile: No results for input(s): CHOL, HDL, LDLCALC, TRIG, CHOLHDL, LDLDIRECT in the last 72 hours. Thyroid Function Tests: No results for input(s): TSH, T4TOTAL, FREET4, T3FREE, THYROIDAB in the last 72 hours. Anemia Panel: No results for input(s): VITAMINB12, FOLATE, FERRITIN, TIBC, IRON, RETICCTPCT in the last 72 hours. Sepsis Labs: Recent Labs  Lab 12/04/23 1851 12/04/23 2108  LATICACIDVEN 7.5* 2.1*    Recent Results (from the past 240 hours)  Blood Culture (routine x 2)     Status: None (Preliminary result)   Collection Time: 12/04/23  6:45 PM   Specimen: BLOOD LEFT FOREARM  Result Value Ref Range Status   Specimen Description BLOOD LEFT FOREARM  Final   Special Requests   Final    BOTTLES DRAWN AEROBIC AND ANAEROBIC Blood Culture adequate volume   Culture   Final    NO GROWTH 2 DAYS Performed at [facility], [address] [place], [place] [address]    Report Status PENDING  Incomplete  Blood Culture (routine x 2)     Status: None (Preliminary result)   Collection Time: 12/04/23  8:18 PM   Specimen: BLOOD  Result Value Ref Range Status   Specimen Description BLOOD SITE NOT SPECIFIED  Final   Special Requests   Final    BOTTLES DRAWN AEROBIC ONLY Blood Culture adequate volume   Culture   Final    NO GROWTH 2 DAYS Performed at [facility], [address], [place], [place] [address]    Report Status PENDING  Incomplete  Resp panel by RT-PCR (RSV, Flu A&B, Covid) Anterior Nasal Swab     Status: None   Collection Time: 12/04/23  9:02 PM   Specimen: Anterior Nasal Swab  Result Value Ref Range Status   SARS Coronavirus 2 by RT PCR NEGATIVE NEGATIVE Final   Influenza A by PCR NEGATIVE NEGATIVE Final   Influenza B by PCR  NEGATIVE NEGATIVE Final    Comment: (NOTE) The Xpert Xpress  SARS-CoV-2/FLU/RSV plus assay is intended as an aid in the diagnosis of influenza from Nasopharyngeal swab specimens and should not be used as a sole basis for treatment. Nasal washings and aspirates are unacceptable for Xpert Xpress SARS-CoV-2/FLU/RSV testing.  Fact Sheet for Patients: bloggercourse.com  Fact Sheet for Healthcare Providers: seriousbroker.it  This test is not yet approved or cleared by the [place]  FDA and has been authorized for detection and/or diagnosis of SARS-CoV-2 by FDA under an Emergency Use Authorization (EUA). This EUA will remain in effect (meaning this test can be used) for the duration of the COVID-19 declaration under Section 564(b)(1) of the Act, 21 U.S.C. section 360bbb-3(b)(1), unless the authorization is terminated or revoked.     Resp Syncytial Virus by PCR NEGATIVE NEGATIVE Final    Comment: (NOTE) Fact Sheet for Patients: bloggercourse.com  Fact Sheet for Healthcare Providers: seriousbroker.it  This test is not yet approved or cleared by the [place]  FDA and has been authorized for detection and/or diagnosis of SARS-CoV-2 by FDA under an Emergency Use Authorization (EUA). This EUA will remain in effect (meaning this test can be used) for the duration of the COVID-19 declaration under Section 564(b)(1) of the Act, 21 U.S.C. section 360bbb-3(b)(1), unless the authorization is terminated or revoked.  Performed at [facility], [address], [place], [place] [address]   C Difficile Quick Screen w PCR reflex     Status: None   Collection Time: 12/05/23 12:17 AM   Specimen: STOOL  Result Value Ref Range Status   C Diff antigen NEGATIVE NEGATIVE Final   C Diff toxin NEGATIVE NEGATIVE Final   C Diff interpretation No C. difficile detected.  Final    Comment:  Performed at [facility], [address], [place], [place] [address]  Gastrointestinal Panel by PCR , Stool     Status: None   Collection Time: 12/05/23 12:17 AM   Specimen: STOOL  Result Value Ref Range Status   Campylobacter species NOT DETECTED NOT DETECTED Final   Plesimonas shigelloides NOT DETECTED NOT DETECTED Final   Salmonella species NOT DETECTED NOT DETECTED Final   Yersinia enterocolitica NOT DETECTED NOT DETECTED Final   Vibrio species NOT DETECTED NOT DETECTED Final   Vibrio cholerae NOT DETECTED NOT DETECTED Final   Enteroaggregative E coli (EAEC) NOT DETECTED NOT DETECTED Final   Enteropathogenic E coli (EPEC) NOT DETECTED NOT DETECTED Final   Enterotoxigenic E coli (ETEC) NOT DETECTED NOT DETECTED Final   Shiga like toxin producing E coli (STEC) NOT DETECTED NOT DETECTED Final   Shigella/Enteroinvasive E coli (EIEC) NOT DETECTED NOT DETECTED Final   Cryptosporidium NOT DETECTED NOT DETECTED Final   Cyclospora cayetanensis NOT DETECTED NOT DETECTED Final   Entamoeba histolytica NOT DETECTED NOT DETECTED Final   Giardia lamblia NOT DETECTED NOT DETECTED Final   Adenovirus F40/41 NOT DETECTED NOT DETECTED Final   Astrovirus NOT DETECTED NOT DETECTED Final   Norovirus GI/GII NOT DETECTED NOT DETECTED Final   Rotavirus A NOT DETECTED NOT DETECTED Final   Sapovirus (I, II, IV, and V) NOT DETECTED NOT DETECTED Final    Comment: Performed at [facility], [address] [place], [place] [address]  MRSA Next Gen by PCR, Nasal     Status: None   Collection Time: 12/05/23  1:20 AM   Specimen: Nasal Mucosa; Nasal Swab  Result Value Ref Range Status   MRSA by PCR Next Gen NOT DETECTED NOT DETECTED Final    Comment: (NOTE)  The GeneXpert MRSA Assay (FDA approved for NASAL specimens only), is one component of a comprehensive MRSA colonization surveillance program. It is not intended to diagnose MRSA infection nor to guide or monitor treatment for MRSA  infections. Test performance is not FDA approved in patients less than 84 years old. Performed at [facility], [address] [place], [place] [address]      Radiology Studies: DG Chest Port 1 View Result Date: 12/06/2023 EXAM: 1 VIEW(S) XRAY OF THE CHEST 12/06/2023 03:49:00 AM COMPARISON: 12/04/2023 CLINICAL HISTORY: 68 year old female with shortness of breath. FINDINGS: LUNGS AND PLEURA: Hyperinflation. Regressed but not resolved pulmonary interstitial edema. Bilateral pleural effusions persist, and interval increased retrocardiac opacity at the left lung base could be larger pleural effusion versus increased atelectasis. No pneumothorax. HEART AND MEDIASTINUM: Aortic calcification. No acute abnormality of the cardiac and mediastinal silhouettes. BONES AND SOFT TISSUES: No acute osseous abnormality. IMPRESSION: 1. Regressed but not resolved  interstitial pulmonary edema. 2. Bilateral pleural effusions persist with increased left basilar opacity possibly larger effusion or increased atelectasis. 3. Hyperinflation. Electronically signed by: Helayne Hurst MD 12/06/2023 03:53 AM EST RP Workstation: [id number]   ECHOCARDIOGRAM COMPLETE Result Date: 12/05/2023    ECHOCARDIOGRAM REPORT   Patient Name:   RENLY GUEDES Date of Exam: 12/05/2023 Medical Rec #:  [id number]       Height:       62.0 in Accession #:    [id number]      Weight:       94.0 lb Date of Birth:  08-21-1955       BSA:          1.387 m Patient Age:    68 years        BP:           128/67 mmHg Patient Gender: F               HR:           70 bpm. Exam Location:  Inpatient Procedure: 2D Echo (Both Spectral and Color Flow Doppler were utilized during            procedure). Indications:    Elevated troponin  History:        Patient has prior history of Echocardiogram examinations.  Sonographer:    Charmaine Gaskins Referring Phys: [id number] DE[address] ORN HOFFMAN IMPRESSIONS  1. Small area of inferior basal akinesis . Left ventricular ejection fraction,  by estimation, is 55%. The left ventricle has normal function. The left ventricle demonstrates regional wall motion abnormalities (see scoring diagram/findings for description). Left ventricular diastolic parameters were normal.  2. Right ventricular systolic function is normal. The right ventricular size is normal.  3. Left atrial size was moderately dilated.  4. The mitral valve is abnormal. Mild mitral valve regurgitation. No evidence of mitral stenosis.  5. The aortic valve is tricuspid. There is mild calcification of the aortic valve. There is mild thickening of the aortic valve. Aortic valve regurgitation is moderate to severe. Aortic valve sclerosis is present, with no evidence of aortic valve stenosis.  6. The inferior vena cava is dilated in size with >50% respiratory variability, suggesting right atrial pressure of 8 mmHg. FINDINGS  Left Ventricle: Small area of inferior basal akinesis. Left ventricular ejection fraction, by estimation, is 55%. The left ventricle has normal function. The left ventricle demonstrates regional wall motion abnormalities. Strain was performed and the global longitudinal strain is indeterminate. The left ventricular internal cavity  size was normal in size. There is no left ventricular hypertrophy. Left ventricular diastolic parameters were normal. Right Ventricle: The right ventricular size is normal. No increase in right ventricular wall thickness. Right ventricular systolic function is normal. Left Atrium: Left atrial size was moderately dilated. Right Atrium: Right atrial size was normal in size. Pericardium: There is no evidence of pericardial effusion. Mitral Valve: The mitral valve is abnormal. There is mild thickening of the mitral valve leaflet(s). There is mild calcification of the mitral valve leaflet(s). Mild mitral valve regurgitation. No evidence of mitral valve stenosis. Tricuspid Valve: The tricuspid valve is normal in structure. Tricuspid valve regurgitation is  mild . No evidence of tricuspid stenosis. Aortic Valve: The aortic valve is tricuspid. There is mild calcification of the aortic valve. There is mild thickening of the aortic valve. Aortic valve regurgitation is moderate to severe. Aortic valve sclerosis is present, with no evidence of aortic valve stenosis. Aortic valve mean gradient measures 6.0 mmHg. Aortic valve peak gradient measures 13.5 mmHg. Aortic valve area, by VTI measures 1.98 cm. Pulmonic Valve: The pulmonic valve was normal in structure. Pulmonic valve regurgitation is not visualized. No evidence of pulmonic stenosis. Aorta: The aortic root is normal in size and structure. Venous: The inferior vena cava is dilated in size with greater than 50% respiratory variability, suggesting right atrial pressure of 8 mmHg. IAS/Shunts: No atrial level shunt detected by color flow Doppler. Additional Comments: 3D was performed not requiring image post processing on an independent workstation and was indeterminate.  LEFT VENTRICLE PLAX 2D LVIDd:         4.70 cm   Diastology LVIDs:         3.30 cm   LV e' medial:    5.98 cm/s LV PW:         1.20 cm   LV E/e' medial:  9.4 LV IVS:        1.00 cm   LV e' lateral:   5.77 cm/s LVOT diam:     1.95 cm   LV E/e' lateral: 9.7 LV SV:         62 LV SV Index:   45 LVOT Area:     2.99 cm  RIGHT VENTRICLE RV Basal diam:  2.80 cm RV Mid diam:    2.40 cm RV S prime:     15.10 cm/s LEFT ATRIUM             Index        RIGHT ATRIUM           Index LA diam:        3.40 cm 2.45 cm/m   RA Area:     10.80 cm LA Vol (A2C):   65.7 ml 47.37 ml/m  RA Volume:   19.60 ml  14.13 ml/m LA Vol (A4C):   84.8 ml 61.14 ml/m LA Biplane Vol: 79.7 ml 57.46 ml/m  AORTIC VALVE AV Area (Vmax):    2.09 cm AV Area (Vmean):   1.95 cm AV Area (VTI):     1.98 cm AV Vmax:           184.00 cm/s AV Vmean:          105.000 cm/s AV VTI:            0.316 m AV Peak Grad:      13.5 mmHg AV Mean Grad:      6.0 mmHg LVOT Vmax:         129.00 cm/s  LVOT Vmean:         68.500 cm/s LVOT VTI:          0.209 m LVOT/AV VTI ratio: 0.66  AORTA Ao Root diam: 2.90 cm Ao Asc diam:  3.40 cm MITRAL VALVE MV Area (PHT): 3.54 cm    SHUNTS MV Decel Time: 214 msec    Systemic VTI:  0.21 m MV E velocity: 56.10 cm/s  Systemic Diam: 1.95 cm MV A velocity: 69.70 cm/s MV E/A ratio:  0.80 Maude Emmer MD Electronically signed by Maude Emmer MD Signature Date/Time: 12/05/2023/11:51:54 AM    Final    CT Angio Chest PE W and/or Wo Contrast Result Date: 12/04/2023 EXAM: CTA CHEST AORTA 12/04/2023 09:27:33 PM TECHNIQUE: CTA of the chest was performed without and with the administration of intravenous contrast. 50 mL (iohexol  (OMNIPAQUE ) 350 MG/ML injection 50 mL IOHEXOL  350 MG/ML SOLN). Multiplanar reformatted images are provided for review. MIP images are provided for review. Automated exposure control, iterative reconstruction, and/or weight based adjustment of the mA/kV was utilized to reduce the radiation dose to as low as reasonably achievable. COMPARISON: Prior examination of 11/14/2023 and remote prior examination of 01/31/2011. CLINICAL HISTORY: evaluate for possible PE FINDINGS: AORTA: Mild atherosclerotic calcification within the thoracic aorta. No aortic aneurysm. No thoracic aortic dissection. MEDIASTINUM: Minimal coronary artery calcification. Global cardiac size within normal limits. The heart and pericardium demonstrate no acute abnormality. No mediastinal lymphadenopathy. LYMPH NODES: No mediastinal, hilar or axillary lymphadenopathy. LUNGS AND PLEURA: There is suboptimal opacification of the pulmonary arterial tree due to bolus timing. Examination is adequate only for exclusion of large pulmonary embolus within the main and central right and left pulmonary arteries. The lobar, segmental and subsegmental pulmonary arteries are not well assessed on this examination. Central pulmonary arteries are of normal caliber. 5 mm microlobulated nodule within the right apex is unchanged from  prior examination of 11/14/2023 but new from remote prior examination of 01/31/2011. Moderate emphysema. Superimposed smooth interlobular septal thickening and ground glass infiltrate as well as small bilateral pleural effusions are in keeping with moderate cardiogenic failure with moderate interstitial and alveolar pulmonary edema. Bibasilar atelectasis is present. No pneumothorax. Right mainstem bronchus and left lower lobe bronchus. UPPER ABDOMEN: Limited images of the upper abdomen are unremarkable. SOFT TISSUES AND BONES: Osseous structures are age appropriate. No acute bone abnormality. No lytic or blastic bone lesion. No acute soft tissue abnormality. IMPRESSION: 1. No evidence of large pulmonary embolus within the main and central right and left pulmonary arteries. Lobar, segmental, and subsegmental pulmonary arteries are not well assessed due to suboptimal opacification. 2. Moderate emphysema. Given that emphysema is an independent risk factor for lung cancer and assuming typical screening age, consider evaluation for a low-dose CT lung cancer screening program if the patient meets eligibility criteria. 3. Findings consistent with moderate cardiogenic failure with moderate interstitial and alveolar pulmonary edema, with small bilateral pleural effusions and bibasilar atelectasis. 4. 5 mm microlobulated right apical pulmonary nodule, unchanged from 11/14/2023 and new from 01/31/2011. For an incidental solid nodule of 05 mm without specified high-risk features, no routine follow-up imaging is recommended per Fleischner Society Guidelines; if the patient is considered high risk or the nodule has high-risk features (including upper lobe location), optional chest CT at 12 months may be considered; if stable at 12 months, no further follow-up. Electronically signed by: Dorethia Molt MD 12/04/2023 10:06 PM EST RP Workstation: [id number]   DG Chest Port 1 View Result Date: 12/04/2023 CLINICAL DATA:  Questionable sepsis. EXAM: PORTABLE CHEST 1 VIEW COMPARISON:  Chest radiograph dated 11/15/2023 FINDINGS: Background of emphysema and diffuse interstitial coarsening slightly progressed since the prior radiograph. Superimposed pneumonia is not excluded. No consolidative changes. No large pleural effusion or pneumothorax. Stable cardiac silhouette. No acute osseous pathology. IMPRESSION: Emphysema and diffuse interstitial coarsening slightly progressed since the prior radiograph. Superimposed pneumonia is not excluded. Electronically Signed   By: Vanetta Chou M.D.   On: 12/04/2023 19:39    Scheduled Meds:  aspirin  EC  81 mg Oral Daily   furosemide   40 mg Intravenous BID   heparin   5,000 Units Subcutaneous Q8H   potassium chloride   10 mEq Oral BID   predniSONE   50 mg Oral Q breakfast   rosuvastatin   10 mg Oral Daily   Continuous Infusions:   LOS: 1 day   Fredia Skeeter, MD [facility]  12/06/2023, 8:51 AM   *Please note that this is a verbal dictation therefore any spelling or grammatical errors are due to the Dragon Medical One system interpretation.  Please page via Amion and do not message via secure chat for urgent patient care matters. Secure chat can be used for non urgent patient care matters.  How to contact the [facility] Attending or Consulting provider 7A - 7P or covering provider during after hours 7P -7A, for this patient?  Check the care team in [facility] and look for a) attending/consulting [facility] provider listed and b) the [facility] team listed. Page or secure chat 7A-7P. Log into [email or web address] and use [place]'s universal password to access. If you do not have the password, please contact the hospital operator. Locate the [facility] provider you are looking for under [facility] and page to a number that you can be directly reached. If you still have difficulty reaching the provider, please page the [facility] (Director on Call) for the Hospitalists listed on amion for assistance.

## 2023-12-06 NOTE — Progress Notes (Signed)
 Cardiology Progress Note:   Patient ID: Wendy [name] MRN: [id number]; DOB: 04/22/1955  Admit date: 12/04/2023 Date of Consult: 12/06/2023  Primary Care Provider: System, Provider Not In [facility] Cardiologist: Lonni Cash, MD  [facility] Electrophysiologist:  None   Patient Profile:   Wendy [name] is a 68 y.o. female with CAD s/p NSTEMI, HFmrEF, HTN, HLD, rectal CA s/p resection, adjuvant treatment, external radiation, DVT, tobacco use, HCV, COPD who is being followed for management of acute pulmonary edema with NSTEMI.  History of Present Illness:   No overnight complaints.  Remains on 1.5 L nasal cannula with sats 95%.  Sinus rhythm 70s overnight with stable blood pressure.  She is fatigued but is breathing okay.  Flat JVP and no lower extremity edema.  Left lung sounds diminished compared to right.  Reviewed plan for cath likely Monday.  Reviewed risk, benefits and alternatives to this.  Past Medical History:  Diagnosis Date   Adenocarcinoma of colon (HCC) 07/2007   Alcohol dependency (HCC)    Allergy    Anxiety    Hep C w/o coma, chronic (HCC)    remote h/o IVD   Hepatitis C    Herpes genitalia    History of DVT (deep vein thrombosis) 12/2007   R jugular vein   HSV-1 (herpes simplex virus 1) infection    HSV-2 (herpes simplex virus 2) infection    Hypertension    Migraine headache    Rectal cancer [facility])    Past Surgical History:  Procedure Laterality Date   ABDOMINAL HYSTERECTOMY  1996   With bilateral oophorectomy.   APPENDECTOMY  1996   BLADDER REPAIR     CLOSED REDUCTION NASAL FRACTURE N/A 10/12/2012   Procedure: CLOSED REDUCTION NASAL BONE FRACTURE WITH STABILZATION ;  Surgeon: Lonni LITTIE Sax, DDS;  Location: [facility] OR;  Service: Oral Surgery;  Laterality: N/A;   COLON SURGERY     COLONOSCOPY W/ BIOPSIES AND POLYPECTOMY     Hx; of   TONSILLECTOMY     TONSILLECTOMY AND ADENOIDECTOMY      Inpatient Medications: Scheduled Meds:   aspirin  EC  81 mg Oral Daily   furosemide   40 mg Intravenous BID   heparin   5,000 Units Subcutaneous Q8H   potassium chloride   10 mEq Oral BID   predniSONE   50 mg Oral Q breakfast   rosuvastatin   10 mg Oral Daily   Continuous Infusions:  PRN Meds: acetaminophen , albuterol , docusate sodium , oxyCODONE , polyethylene glycol, sodium chloride  flush  Allergies:    Allergies  Allergen Reactions   Amoxicillin Rash   Vancomycin Itching    Erythema and mild itching -  Resolved with IV Benadryl .   Erythromycin Base Hives and Other (See Comments)    Pt states it makes her shaky.   Social History:   Social History   Socioeconomic History   Marital status: Single    Spouse name: Not on file   Number of children: Not on file   Years of education: Not on file   Highest education level: Not on file  Occupational History   Not on file  Tobacco Use   Smoking status: Every Day    Current packs/day: 1.50    Types: Cigarettes   Smokeless tobacco: Never  Vaping Use   Vaping status: Never Used  Substance and Sexual Activity   Alcohol use: Yes    Alcohol/week: 20.0 standard drinks of alcohol    Types: 20 Shots of liquor per week   Drug use:  No   Sexual activity: Not Currently  Other Topics Concern   Not on file  Social History Narrative   Not on file   Social Drivers of Health   Financial Resource Strain: Not on file  Food Insecurity: Food Insecurity Present (12/05/2023)   Hunger Vital Sign    Worried About Running Out of Food in the Last Year: Sometimes true    Ran Out of Food in the Last Year: Sometimes true  Transportation Needs: No Transportation Needs (12/05/2023)   PRAPARE - Administrator, Civil Service (Medical): No    Lack of Transportation (Non-Medical): No  Physical Activity: Not on file  Stress: Not on file  Social Connections: Socially Isolated (12/05/2023)   Social Connection and Isolation Panel    Frequency of Communication with Friends and Family:  Twice a week    Frequency of Social Gatherings with Friends and Family: More than three times a week    Attends Religious Services: Never    Database Administrator or Organizations: No    Attends Banker Meetings: Never    Marital Status: Separated  Intimate Partner Violence: Not At Risk (12/05/2023)   Humiliation, Afraid, Rape, and Kick questionnaire    Fear of Current or Ex-Partner: No    Emotionally Abused: No    Physically Abused: No    Sexually Abused: No    Family History:    Family History  Problem Relation Age of Onset   Colon cancer Mother    Hypertension Mother    Hyperparathyroidism Mother    Colon cancer Father    Cancer Father    Cancer Other     ROS:  Review of Systems: [y] = yes, [ ]  = no      General: Weight gain [ ] ; Weight loss [ ] ; Anorexia [ ] ; Fatigue [ ] ; Fever [ ] ; Chills [ ] ; Weakness [ ]    Cardiac: Chest pain/pressure [ ] ; Resting SOB [ ] ; Exertional SOB [ ] ; Orthopnea [ ] ; Pedal Edema [ ] ; Palpitations [ ] ; Syncope [ ] ; Presyncope [ ] ; Paroxysmal nocturnal dyspnea [ ]    Pulmonary: Cough [ ] ; Wheezing [ ] ; Hemoptysis [ ] ; Sputum [ ] ; Snoring [ ]    GI: Vomiting [ ] ; Dysphagia [ ] ; Melena [ ] ; Hematochezia [ ] ; Heartburn [ ] ; Abdominal pain [ ] ; Constipation [ ] ; Diarrhea [ ] ; BRBPR [ ]    GU: Hematuria [ ] ; Dysuria [ ] ; Nocturia [ ]  Vascular: Pain in legs with walking [ ] ; Pain in feet with lying flat [ ] ; Non-healing sores [ ] ; Stroke [ ] ; TIA [ ] ; Slurred speech [ ] ;   Neuro: Headaches [ ] ; Vertigo [ ] ; Seizures [ ] ; Paresthesias [ ] ;Blurred vision [ ] ; Diplopia [ ] ; Vision changes [ ]    Ortho/Skin: Arthritis [ ] ; Joint pain [ ] ; Muscle pain [ ] ; Joint swelling [ ] ; Back Pain [ ] ; Rash [ ]    Psych: Depression [ ] ; Anxiety [ ]    Heme: Bleeding problems [ ] ; Clotting disorders [ ] ; Anemia [ ]    Endocrine: Diabetes [ ] ; Thyroid dysfunction [ ]    Physical Exam/Data:   Vitals:   12/06/23 0800 12/06/23 1206 12/06/23 1445 12/06/23 1626  BP:  133/89 98/66  139/75  Pulse: 77 (!) 58 65 64  Resp: 16 18  17   Temp: 98.7 F (37.1 C) 98.3 F (36.8 C)  98.4 F (36.9 C)  TempSrc: Oral Oral  Oral  SpO2: 95%  97% 92% 100%  Weight:      Height:        Intake/Output Summary (Last 24 hours) at 12/06/2023 1643 Last data filed at 12/06/2023 1000 Gross per 24 hour  Intake 900 ml  Output 2400 ml  Net -1500 ml      12/06/2023    3:55 AM 12/05/2023    1:03 PM 12/04/2023   11:30 PM  Last 3 Weights  Weight (lbs) 98 lb 1.7 oz 97 lb 12.8 oz 94 lb  Weight (kg) 44.5 kg 44.362 kg 42.638 kg     Body mass index is 17.94 kg/m.  General: frail appearing, conversant  HEENT: normal Neck: no JVD Cardiac:  normal S1, S2; RRR; no murmur  Lungs:  clear to auscultation bilaterally, no wheezing, rhonchi or rales  Ext: no edema Musculoskeletal: No deformities, BUE and BLE strength normal and equal Skin: warm and dry  Neuro:  CNs 2-12 intact, no focal abnormalities noted Psych:  Normal affect   Telemetry:  Telemetry was personally reviewed and demonstrates:  NSR 60-70s  Relevant CV Studies:  TTE Result date: 12/05/23  1. Small area of inferior basal akinesis . Left ventricular ejection  fraction, by estimation, is 55%. The left ventricle has normal function.  The left ventricle demonstrates regional wall motion abnormalities (see  scoring diagram/findings for  description). Left ventricular diastolic parameters were normal.   2. Right ventricular systolic function is normal. The right ventricular  size is normal.   3. Left atrial size was moderately dilated.   4. The mitral valve is abnormal. Mild mitral valve regurgitation. No  evidence of mitral stenosis.   5. The aortic valve is tricuspid. There is mild calcification of the  aortic valve. There is mild thickening of the aortic valve. Aortic valve  regurgitation is moderate to severe. Aortic valve sclerosis is present,  with no evidence of aortic valve  stenosis.   6. The inferior  vena cava is dilated in size with >50% respiratory  variability, suggesting right atrial pressure of 8 mmHg.   Laboratory Data:  High Sensitivity Troponin:   Recent Labs  Lab 11/14/23 0730 11/14/23 0829 12/04/23 1840 12/04/23 2059 12/04/23 2345  TROPONINIHS 499* 495* 73* 729* 801*     Chemistry Recent Labs  Lab 12/04/23 1840 12/04/23 1844 12/04/23 1847 12/05/23 0122 12/06/23 0208  NA 139   < > 137 140 135  K 3.3*   < > 3.5 3.4* 3.6  CL 104  --   --  102 96*  CO2 17*  --   --  23 27  GLUCOSE 225*  --   --  139* 104*  BUN 11  --   --  14 15  CREATININE 0.92  --   --  0.82 0.89  CALCIUM  8.5*  --   --  8.1* 8.3*  GFRNONAA >60  --   --  >60 >60  ANIONGAP 18*  --   --  15 12   < > = values in this interval not displayed.    Recent Labs  Lab 12/06/23 0208  PROT 6.1*  ALBUMIN 2.8*  AST 28  ALT 18  ALKPHOS 32*  BILITOT 1.2   Hematology Recent Labs  Lab 12/04/23 1840 12/04/23 1844 12/04/23 1847 12/05/23 0122 12/06/23 0208  WBC 8.1  --   --  6.1 10.5  RBC 4.12  --   --  3.94 3.59*  HGB 13.2   < > 13.6 12.6 11.5*  HCT 41.9   < >  40.0 38.5 33.4*  MCV 101.7*  --   --  97.7 93.0  MCH 32.0  --   --  32.0 32.0  MCHC 31.5  --   --  32.7 34.4  RDW 15.3  --   --  15.0 15.0  PLT 353  --   --  328 244   < > = values in this interval not displayed.   BNP Recent Labs  Lab 12/04/23 1840  BNP 1,587.8*    Radiology/Studies:  DG Chest Port 1 View Result Date: 12/06/2023 EXAM: 1 VIEW(S) XRAY OF THE CHEST 12/06/2023 03:49:00 AM COMPARISON: 12/04/2023 CLINICAL HISTORY: 68 year old female with shortness of breath. FINDINGS: LUNGS AND PLEURA: Hyperinflation. Regressed but not resolved pulmonary interstitial edema. Bilateral pleural effusions persist, and interval increased retrocardiac opacity at the left lung base could be larger pleural effusion versus increased atelectasis. No pneumothorax. HEART AND MEDIASTINUM: Aortic calcification. No acute abnormality of the cardiac and  mediastinal silhouettes. BONES AND SOFT TISSUES: No acute osseous abnormality. IMPRESSION: 1. Regressed but not resolved  interstitial pulmonary edema. 2. Bilateral pleural effusions persist with increased left basilar opacity possibly larger effusion or increased atelectasis. 3. Hyperinflation. Electronically signed by: Helayne Hurst MD 12/06/2023 03:53 AM EST RP Workstation: [id number]   ECHOCARDIOGRAM COMPLETE Result Date: 12/05/2023    ECHOCARDIOGRAM REPORT   Patient Name:   Wendy [name] Date of Exam: 12/05/2023 Medical Rec #:  [id number]       Height:       62.0 in Accession #:    [id number]      Weight:       94.0 lb Date of Birth:  May 24, 1955       BSA:          1.387 m Patient Age:    68 years        BP:           128/67 mmHg Patient Gender: F               HR:           70 bpm. Exam Location:  Inpatient Procedure: 2D Echo (Both Spectral and Color Flow Doppler were utilized during            procedure). Indications:    Elevated troponin  History:        Patient has prior history of Echocardiogram examinations.  Sonographer:    Charmaine Gaskins Referring Phys: [id number] DEWARD ORN HOFFMAN IMPRESSIONS  1. Small area of inferior basal akinesis . Left ventricular ejection fraction, by estimation, is 55%. The left ventricle has normal function. The left ventricle demonstrates regional wall motion abnormalities (see scoring diagram/findings for description). Left ventricular diastolic parameters were normal.  2. Right ventricular systolic function is normal. The right ventricular size is normal.  3. Left atrial size was moderately dilated.  4. The mitral valve is abnormal. Mild mitral valve regurgitation. No evidence of mitral stenosis.  5. The aortic valve is tricuspid. There is mild calcification of the aortic valve. There is mild thickening of the aortic valve. Aortic valve regurgitation is moderate to severe. Aortic valve sclerosis is present, with no evidence of aortic valve stenosis.  6. The inferior vena cava is  dilated in size with >50% respiratory variability, suggesting right atrial pressure of 8 mmHg. FINDINGS  Left Ventricle: Small area of inferior basal akinesis. Left ventricular ejection fraction, by estimation, is 55%. The left ventricle has normal function. The left ventricle demonstrates regional wall motion  abnormalities. Strain was performed and the global longitudinal strain is indeterminate. The left ventricular internal cavity size was normal in size. There is no left ventricular hypertrophy. Left ventricular diastolic parameters were normal. Right Ventricle: The right ventricular size is normal. No increase in right ventricular wall thickness. Right ventricular systolic function is normal. Left Atrium: Left atrial size was moderately dilated. Right Atrium: Right atrial size was normal in size. Pericardium: There is no evidence of pericardial effusion. Mitral Valve: The mitral valve is abnormal. There is mild thickening of the mitral valve leaflet(s). There is mild calcification of the mitral valve leaflet(s). Mild mitral valve regurgitation. No evidence of mitral valve stenosis. Tricuspid Valve: The tricuspid valve is normal in structure. Tricuspid valve regurgitation is mild . No evidence of tricuspid stenosis. Aortic Valve: The aortic valve is tricuspid. There is mild calcification of the aortic valve. There is mild thickening of the aortic valve. Aortic valve regurgitation is moderate to severe. Aortic valve sclerosis is present, with no evidence of aortic valve stenosis. Aortic valve mean gradient measures 6.0 mmHg. Aortic valve peak gradient measures 13.5 mmHg. Aortic valve area, by VTI measures 1.98 cm. Pulmonic Valve: The pulmonic valve was normal in structure. Pulmonic valve regurgitation is not visualized. No evidence of pulmonic stenosis. Aorta: The aortic root is normal in size and structure. Venous: The inferior vena cava is dilated in size with greater than 50% respiratory variability,  suggesting right atrial pressure of 8 mmHg. IAS/Shunts: No atrial level shunt detected by color flow Doppler. Additional Comments: 3D was performed not requiring image post processing on an independent workstation and was indeterminate.  LEFT VENTRICLE PLAX 2D LVIDd:         4.70 cm   Diastology LVIDs:         3.30 cm   LV e' medial:    5.98 cm/s LV PW:         1.20 cm   LV E/e' medial:  9.4 LV IVS:        1.00 cm   LV e' lateral:   5.77 cm/s LVOT diam:     1.95 cm   LV E/e' lateral: 9.7 LV SV:         62 LV SV Index:   45 LVOT Area:     2.99 cm  RIGHT VENTRICLE RV Basal diam:  2.80 cm RV Mid diam:    2.40 cm RV S prime:     15.10 cm/s LEFT ATRIUM             Index        RIGHT ATRIUM           Index LA diam:        3.40 cm 2.45 cm/m   RA Area:     10.80 cm LA Vol (A2C):   65.7 ml 47.37 ml/m  RA Volume:   19.60 ml  14.13 ml/m LA Vol (A4C):   84.8 ml 61.14 ml/m LA Biplane Vol: 79.7 ml 57.46 ml/m  AORTIC VALVE AV Area (Vmax):    2.09 cm AV Area (Vmean):   1.95 cm AV Area (VTI):     1.98 cm AV Vmax:           184.00 cm/s AV Vmean:          105.000 cm/s AV VTI:            0.316 m AV Peak Grad:      13.5 mmHg AV Mean Grad:  6.0 mmHg LVOT Vmax:         129.00 cm/s LVOT Vmean:        68.500 cm/s LVOT VTI:          0.209 m LVOT/AV VTI ratio: 0.66  AORTA Ao Root diam: 2.90 cm Ao Asc diam:  3.40 cm MITRAL VALVE MV Area (PHT): 3.54 cm    SHUNTS MV Decel Time: 214 msec    Systemic VTI:  0.21 m MV E velocity: 56.10 cm/s  Systemic Diam: 1.95 cm MV A velocity: 69.70 cm/s MV E/A ratio:  0.80 Maude Emmer MD Electronically signed by Maude Emmer MD Signature Date/Time: 12/05/2023/11:51:54 AM    Final    CT Angio Chest PE W and/or Wo Contrast Result Date: 12/04/2023 EXAM: CTA CHEST AORTA 12/04/2023 09:27:33 PM TECHNIQUE: CTA of the chest was performed without and with the administration of intravenous contrast. 50 mL (iohexol  (OMNIPAQUE ) 350 MG/ML injection 50 mL IOHEXOL  350 MG/ML SOLN). Multiplanar reformatted  images are provided for review. MIP images are provided for review. Automated exposure control, iterative reconstruction, and/or weight based adjustment of the mA/kV was utilized to reduce the radiation dose to as low as reasonably achievable. COMPARISON: Prior examination of 11/14/2023 and remote prior examination of 01/31/2011. CLINICAL HISTORY: evaluate for possible PE FINDINGS: AORTA: Mild atherosclerotic calcification within the thoracic aorta. No aortic aneurysm. No thoracic aortic dissection. MEDIASTINUM: Minimal coronary artery calcification. Global cardiac size within normal limits. The heart and pericardium demonstrate no acute abnormality. No mediastinal lymphadenopathy. LYMPH NODES: No mediastinal, hilar or axillary lymphadenopathy. LUNGS AND PLEURA: There is suboptimal opacification of the pulmonary arterial tree due to bolus timing. Examination is adequate only for exclusion of large pulmonary embolus within the main and central right and left pulmonary arteries. The lobar, segmental and subsegmental pulmonary arteries are not well assessed on this examination. Central pulmonary arteries are of normal caliber. 5 mm microlobulated nodule within the right apex is unchanged from prior examination of 11/14/2023 but new from remote prior examination of 01/31/2011. Moderate emphysema. Superimposed smooth interlobular septal thickening and ground glass infiltrate as well as small bilateral pleural effusions are in keeping with moderate cardiogenic failure with moderate interstitial and alveolar pulmonary edema. Bibasilar atelectasis is present. No pneumothorax. Right mainstem bronchus and left lower lobe bronchus. UPPER ABDOMEN: Limited images of the upper abdomen are unremarkable. SOFT TISSUES AND BONES: Osseous structures are age appropriate. No acute bone abnormality. No lytic or blastic bone lesion. No acute soft tissue abnormality. IMPRESSION: 1. No evidence of large pulmonary embolus within the main and  central right and left pulmonary arteries. Lobar, segmental, and subsegmental pulmonary arteries are not well assessed due to suboptimal opacification. 2. Moderate emphysema. Given that emphysema is an independent risk factor for lung cancer and assuming typical screening age, consider evaluation for a low-dose CT lung cancer screening program if the patient meets eligibility criteria. 3. Findings consistent with moderate cardiogenic failure with moderate interstitial and alveolar pulmonary edema, with small bilateral pleural effusions and bibasilar atelectasis. 4. 5 mm microlobulated right apical pulmonary nodule, unchanged from 11/14/2023 and new from 01/31/2011. For an incidental solid nodule of 05 mm without specified high-risk features, no routine follow-up imaging is recommended per Fleischner Society Guidelines; if the patient is considered high risk or the nodule has high-risk features (including upper lobe location), optional chest CT at 12 months may be considered; if stable at 12 months, no further follow-up. Electronically signed by: Dorethia Molt MD 12/04/2023 10:06 PM EST RP Workstation:  [id number]   DG Chest Port 1 View Result Date: 12/04/2023 CLINICAL DATA:  Questionable sepsis. EXAM: PORTABLE CHEST 1 VIEW COMPARISON:  Chest radiograph dated 11/15/2023 FINDINGS: Background of emphysema and diffuse interstitial coarsening slightly progressed since the prior radiograph. Superimposed pneumonia is not excluded. No consolidative changes. No large pleural effusion or pneumothorax. Stable cardiac silhouette. No acute osseous pathology. IMPRESSION: Emphysema and diffuse interstitial coarsening slightly progressed since the prior radiograph. Superimposed pneumonia is not excluded. Electronically Signed   By: Vanetta Chou M.D.   On: 12/04/2023 19:39   Assessment and Plan:  Wendy [name] is a 68 y.o. female with CAD s/p NSTEMI, HFmrEF, HTN, HLD, rectal CA s/p resection, adjuvant treatment, external  radiation, DVT, tobacco use, HCV, COPD who is being followed for management of acute pulmonary edema with NSTEMI.   NSTEMI -- presented with acute respiratory failure in the setting of possible COPD exacerbation with continued tobacco use -- found to have elevated hsTn [id number], EKG w/diffuse anterolateral ST depression and TWI with repeat showing TWI in v5-v6 -- denies any chest pain -- previously seen 10/31 and treated medically -- continue ASA 81 mg daily  -- continue crestor  -- pending cath tentaively Monday    Hypotension Elevated lactic acid Shock  -- initial lactic acid 7.5>>2.1  -- given IV lasix , then developed hypotension regarding low dose NE. Now weaned   Acute respiratory failure with hypercarbia COPD -- per PCCM   Acute on chronic HFmrEF -- echo 10/31 LVEF 45%, normal RV, small pericardial effusion -- CT chest with pulmonary edema, small bilateral pleural effusions . BNP 1587 -- GDMT: held for now in the setting of hypotension   For questions or updates, please contact [facility] Please consult [email or web address] for contact info under   Signed, Donnice DELENA Primus, MD  12/06/2023 4:43 PM

## 2023-12-07 DIAGNOSIS — R579 Shock, unspecified: Secondary | ICD-10-CM | POA: Diagnosis not present

## 2023-12-07 LAB — BASIC METABOLIC PANEL WITH GFR
Anion gap: 14 (ref 5–15)
BUN: 19 mg/dL (ref 8–23)
CO2: 27 mmol/L (ref 22–32)
Calcium: 8.6 mg/dL — ABNORMAL LOW (ref 8.9–10.3)
Chloride: 95 mmol/L — ABNORMAL LOW (ref 98–111)
Creatinine, Ser: 0.74 mg/dL (ref 0.44–1.00)
GFR, Estimated: >60 mL/min (ref >60)
Glucose, Bld: 83 mg/dL (ref 70–99)
Potassium: 3.7 mmol/L (ref 3.5–5.1)
Sodium: 136 mmol/L (ref 135–145)

## 2023-12-07 LAB — CBC
HCT: 35.7 % — ABNORMAL LOW (ref 36.0–46.0)
Hemoglobin: 12.3 g/dL (ref 12.0–15.0)
MCH: 32.2 pg (ref 26.0–34.0)
MCHC: 34.5 g/dL (ref 30.0–36.0)
MCV: 93.5 fL (ref 80.0–100.0)
Platelets: 260 K/uL (ref 150–400)
RBC: 3.82 MIL/uL — ABNORMAL LOW (ref 3.87–5.11)
RDW: 14.6 % (ref 11.5–15.5)
WBC: 11.9 K/uL — ABNORMAL HIGH (ref 4.0–10.5)
nRBC: 0 % (ref 0.0–0.2)

## 2023-12-07 MED ORDER — INFLUENZA VAC SPLIT HIGH-DOSE 0.5 ML IM SUSY
0.5000 mL | PREFILLED_SYRINGE | INTRAMUSCULAR | Status: DC
  Filled 2023-12-07: qty 0.5

## 2023-12-07 MED ORDER — FUROSEMIDE 40 MG PO TABS
40.0000 mg | ORAL_TABLET | Freq: Every day | ORAL | Status: DC
  Administered 2023-12-07 – 2023-12-09 (×3): 40 mg via ORAL
  Filled 2023-12-07 (×3): qty 1

## 2023-12-07 MED ORDER — LISINOPRIL 5 MG PO TABS
5.0000 mg | ORAL_TABLET | Freq: Every day | ORAL | Status: DC
  Administered 2023-12-07 – 2023-12-09 (×3): 5 mg via ORAL
  Filled 2023-12-07 (×3): qty 1

## 2023-12-07 NOTE — Progress Notes (Signed)
 Progress Note  Patient Name: Wendy [name] Date of Encounter: 12/07/2023  Primary Cardiologist: Lonni Cash, MD   Patient ID  Wendy [name] is a 68 y.o. female with CAD s/p NSTEMI, HFmrEF, HTN, HLD, rectal CA s/p resection, adjuvant treatment, external radiation, DVT, tobacco use, HCV, COPD who is being followed for management of acute pulmonary edema with NSTEMI.   Subjective   No overnight events. NSR 50-60s. No recurrent SOB. Very frail, not able to get around well. At home by her self.   Inpatient Medications    Scheduled Meds:  aspirin  EC  81 mg Oral Daily   furosemide   40 mg Intravenous BID   heparin   5,000 Units Subcutaneous Q8H   predniSONE   50 mg Oral Q breakfast   rosuvastatin   10 mg Oral Daily   Continuous Infusions:  PRN Meds: acetaminophen , albuterol , docusate sodium , oxyCODONE , polyethylene glycol, sodium chloride  flush   Vital Signs    Vitals:   12/06/23 1626 12/06/23 2000 12/07/23 0026 12/07/23 0500  BP: 139/75 (!) 144/72 (!) 93/59 136/82  Pulse: 64 64  (!) 58  Resp: 17 16 17 17   Temp: 98.4 F (36.9 C) 97.7 F (36.5 C) 97.6 F (36.4 C) 98.1 F (36.7 C)  TempSrc: Oral Oral Axillary Oral  SpO2: 100% 97%  99%  Weight:    40.6 kg  Height:        Intake/Output Summary (Last 24 hours) at 12/07/2023 0629 Last data filed at 12/07/2023 0500 Gross per 24 hour  Intake 480 ml  Output 1050 ml  Net -570 ml   Filed Weights   12/05/23 1303 12/06/23 0355 12/07/23 0500  Weight: 44.4 kg 44.5 kg 40.6 kg    Telemetry    NSR 50-60s, no arrhythmias - Personally Reviewed  ECG    None today   Physical Exam   GEN: No acute distress.   Neck: No JVD Cardiac: RRR, no murmurs, rubs, or gallops.  Respiratory: diminished breath sounds at b/l bases, L > R GI: Soft, nontender, non-distended  MS: No edema; No deformity. Neuro:  Nonfocal  Psych: Normal affect   Labs    Chemistry Recent Labs  Lab 12/05/23 0122 12/06/23 0208 12/07/23 0322   NA 140 135 136  K 3.4* 3.6 3.7  CL 102 96* 95*  CO2 23 27 27   GLUCOSE 139* 104* 83  BUN 14 15 19   CREATININE 0.82 0.89 0.74  CALCIUM  8.1* 8.3* 8.6*  PROT  --  6.1*  --   ALBUMIN  --  2.8*  --   AST  --  28  --   ALT  --  18  --   ALKPHOS  --  32*  --   BILITOT  --  1.2  --   GFRNONAA >60 >60 >60  ANIONGAP 15 12 14     Hematology Recent Labs  Lab 12/05/23 0122 12/06/23 0208 12/07/23 0322  WBC 6.1 10.5 11.9*  RBC 3.94 3.59* 3.82*  HGB 12.6 11.5* 12.3  HCT 38.5 33.4* 35.7*  MCV 97.7 93.0 93.5  MCH 32.0 32.0 32.2  MCHC 32.7 34.4 34.5  RDW 15.0 15.0 14.6  PLT 328 244 260   BNP Recent Labs  Lab 12/04/23 1840  BNP 1,587.8*    Radiology    DG Chest Port 1 View Result Date: 12/06/2023 EXAM: 1 VIEW(S) XRAY OF THE CHEST 12/06/2023 03:49:00 AM COMPARISON: 12/04/2023 CLINICAL HISTORY: 68 year old female with shortness of breath. FINDINGS: LUNGS AND PLEURA: Hyperinflation. Regressed but  not resolved pulmonary interstitial edema. Bilateral pleural effusions persist, and interval increased retrocardiac opacity at the left lung base could be larger pleural effusion versus increased atelectasis. No pneumothorax. HEART AND MEDIASTINUM: Aortic calcification. No acute abnormality of the cardiac and mediastinal silhouettes. BONES AND SOFT TISSUES: No acute osseous abnormality. IMPRESSION: 1. Regressed but not resolved  interstitial pulmonary edema. 2. Bilateral pleural effusions persist with increased left basilar opacity possibly larger effusion or increased atelectasis. 3. Hyperinflation. Electronically signed by: Helayne Hurst MD 12/06/2023 03:53 AM EST RP Workstation: [id number]   ECHOCARDIOGRAM COMPLETE Result Date: 12/05/2023    ECHOCARDIOGRAM REPORT   Patient Name:   Wendy [name] Date of Exam: 12/05/2023 Medical Rec #:  [id number]       Height:       62.0 in Accession #:    [id number]      Weight:       94.0 lb Date of Birth:  04-21-1955       BSA:          1.387 m Patient Age:    68  years        BP:           128/67 mmHg Patient Gender: F               HR:           70 bpm. Exam Location:  Inpatient Procedure: 2D Echo (Both Spectral and Color Flow Doppler were utilized during            procedure). Indications:    Elevated troponin  History:        Patient has prior history of Echocardiogram examinations.  Sonographer:    Charmaine Gaskins Referring Phys: [id number] DEWARD ORN HOFFMAN IMPRESSIONS  1. Small area of inferior basal akinesis . Left ventricular ejection fraction, by estimation, is 55%. The left ventricle has normal function. The left ventricle demonstrates regional wall motion abnormalities (see scoring diagram/findings for description). Left ventricular diastolic parameters were normal.  2. Right ventricular systolic function is normal. The right ventricular size is normal.  3. Left atrial size was moderately dilated.  4. The mitral valve is abnormal. Mild mitral valve regurgitation. No evidence of mitral stenosis.  5. The aortic valve is tricuspid. There is mild calcification of the aortic valve. There is mild thickening of the aortic valve. Aortic valve regurgitation is moderate to severe. Aortic valve sclerosis is present, with no evidence of aortic valve stenosis.  6. The inferior vena cava is dilated in size with >50% respiratory variability, suggesting right atrial pressure of 8 mmHg. FINDINGS  Left Ventricle: Small area of inferior basal akinesis. Left ventricular ejection fraction, by estimation, is 55%. The left ventricle has normal function. The left ventricle demonstrates regional wall motion abnormalities. Strain was performed and the global longitudinal strain is indeterminate. The left ventricular internal cavity size was normal in size. There is no left ventricular hypertrophy. Left ventricular diastolic parameters were normal. Right Ventricle: The right ventricular size is normal. No increase in right ventricular wall thickness. Right ventricular systolic function is normal.  Left Atrium: Left atrial size was moderately dilated. Right Atrium: Right atrial size was normal in size. Pericardium: There is no evidence of pericardial effusion. Mitral Valve: The mitral valve is abnormal. There is mild thickening of the mitral valve leaflet(s). There is mild calcification of the mitral valve leaflet(s). Mild mitral valve regurgitation. No evidence of mitral valve stenosis. Tricuspid Valve: The tricuspid valve is normal in structure.  Tricuspid valve regurgitation is mild . No evidence of tricuspid stenosis. Aortic Valve: The aortic valve is tricuspid. There is mild calcification of the aortic valve. There is mild thickening of the aortic valve. Aortic valve regurgitation is moderate to severe. Aortic valve sclerosis is present, with no evidence of aortic valve stenosis. Aortic valve mean gradient measures 6.0 mmHg. Aortic valve peak gradient measures 13.5 mmHg. Aortic valve area, by VTI measures 1.98 cm. Pulmonic Valve: The pulmonic valve was normal in structure. Pulmonic valve regurgitation is not visualized. No evidence of pulmonic stenosis. Aorta: The aortic root is normal in size and structure. Venous: The inferior vena cava is dilated in size with greater than 50% respiratory variability, suggesting right atrial pressure of 8 mmHg. IAS/Shunts: No atrial level shunt detected by color flow Doppler. Additional Comments: 3D was performed not requiring image post processing on an independent workstation and was indeterminate.  LEFT VENTRICLE PLAX 2D LVIDd:         4.70 cm   Diastology LVIDs:         3.30 cm   LV e' medial:    5.98 cm/s LV PW:         1.20 cm   LV E/e' medial:  9.4 LV IVS:        1.00 cm   LV e' lateral:   5.77 cm/s LVOT diam:     1.95 cm   LV E/e' lateral: 9.7 LV SV:         62 LV SV Index:   45 LVOT Area:     2.99 cm  RIGHT VENTRICLE RV Basal diam:  2.80 cm RV Mid diam:    2.40 cm RV S prime:     15.10 cm/s LEFT ATRIUM             Index        RIGHT ATRIUM           Index LA  diam:        3.40 cm 2.45 cm/m   RA Area:     10.80 cm LA Vol (A2C):   65.7 ml 47.37 ml/m  RA Volume:   19.60 ml  14.13 ml/m LA Vol (A4C):   84.8 ml 61.14 ml/m LA Biplane Vol: 79.7 ml 57.46 ml/m  AORTIC VALVE AV Area (Vmax):    2.09 cm AV Area (Vmean):   1.95 cm AV Area (VTI):     1.98 cm AV Vmax:           184.00 cm/s AV Vmean:          105.000 cm/s AV VTI:            0.316 m AV Peak Grad:      13.5 mmHg AV Mean Grad:      6.0 mmHg LVOT Vmax:         129.00 cm/s LVOT Vmean:        68.500 cm/s LVOT VTI:          0.209 m LVOT/AV VTI ratio: 0.66  AORTA Ao Root diam: 2.90 cm Ao Asc diam:  3.40 cm MITRAL VALVE MV Area (PHT): 3.54 cm    SHUNTS MV Decel Time: 214 msec    Systemic VTI:  0.21 m MV E velocity: 56.10 cm/s  Systemic Diam: 1.95 cm MV A velocity: 69.70 cm/s MV E/A ratio:  0.80 Maude Emmer MD Electronically signed by Maude Emmer MD Signature Date/Time: 12/05/2023/11:51:54 AM    Final    Cardiac Studies  TTE Result date: 12/05/23  1. Small area of inferior basal akinesis . Left ventricular ejection  fraction, by estimation, is 55%. The left ventricle has normal function.  The left ventricle demonstrates regional wall motion abnormalities (see  scoring diagram/findings for  description). Left ventricular diastolic parameters were normal.   2. Right ventricular systolic function is normal. The right ventricular  size is normal.   3. Left atrial size was moderately dilated.   4. The mitral valve is abnormal. Mild mitral valve regurgitation. No  evidence of mitral stenosis.   5. The aortic valve is tricuspid. There is mild calcification of the  aortic valve. There is mild thickening of the aortic valve. Aortic valve  regurgitation is moderate to severe. Aortic valve sclerosis is present,  with no evidence of aortic valve  stenosis.   6. The inferior vena cava is dilated in size with >50% respiratory  variability, suggesting right atrial pressure of 8 mmHg.   Assessment & Plan   SECILIA APPS is a 68 y.o. female with CAD s/p NSTEMI, HFmrEF, HTN, HLD, rectal CA s/p resection, adjuvant treatment, external radiation, DVT, tobacco use, HCV, COPD who is being followed for management of acute pulmonary edema with NSTEMI.   NSTEMI -- presented with acute respiratory failure in the setting of possible COPD exacerbation with continued tobacco use -- found to have elevated hsTn [phone], EKG w/diffuse anterolateral ST depression and TWI with repeat showing TWI in v5-v6 -- denies any chest pain, all SOB -- previously seen 10/31 and treated medically -- continue ASA 81 mg daily  -- continue crestor  -- pending cath tentatively Monday    Hypotension Elevated lactic acid Shock  -- initial lactic acid 7.5>>2.1  -- given IV lasix , then developed hypotension regarding low dose NE. Now weaned off -- s/p lasix  40 mg bid yesterday, net negative 570, will start on lasix  40 mg PO today for maintenance, still on O2, not hooked up to oximeter, check later today to see if sats stable and will try to wean O2    Acute respiratory failure with hypercarbia COPD -- per PCCM   Acute on chronic HFmrEF -- echo 10/31 LVEF 45% now 55%, normal RV, small pericardial effusion -- CT chest with pulmonary edema, small bilateral pleural effusions . BNP 1587 -- GDMT: held for now with frequent hypotension, with preserved LVEF and borderline BP holding off on GDMT   For questions or updates, please contact [facility] Please consult [email or web address] for contact info under Cardiology/STEMI.   Signed, Donnice DELENA Primus, MD  12/07/2023, 6:29 AM

## 2023-12-07 NOTE — Plan of Care (Signed)

## 2023-12-07 NOTE — Progress Notes (Signed)
 PROGRESS NOTE    Wendy [name]  FMW:[id number] DOB: 22-Feb-1955 DOA: 12/04/2023 PCP: System, Provider Not In   Brief Narrative:  68 year old female with PMH significant for adenocarcinoma of the colon s/p resection and radiation. She is a drinker and smoker with history of hepatitis C. She was recently admitted to the hospitalist service for HFrEF with concern for Takotsubo cardiomyopathy. She was diuresed and discharged on GDMT with improvement 11/3. 11/20 she again presented to [facility] ED with complaints of sudden onset shortness of breath. She deteriorated en route to ED with EMS and was placed on BiPAP. Workup in the emergency department included CT angiogram chest, which was negative for PE, but was concerning for pulmonary edema and volume overload. She was treated for COPD exacerbation and was also given 80mg  IV lasix  with markedly improved respiratory status. Shortly thereafter she became slightly hypotensive and was started on NE infusion.  Therefore she was admitted under PCCM team and ICU.  Eventually diuresed very well, weaned off of vasopressors and transferred to [facility] on 12/06/2023.  Assessment & Plan:   Principal Problem:   Shock (HCC) Active Problems:   Acute pulmonary edema (HCC)   Acute on chronic respiratory failure with hypercapnia (HCC)   Non-ST elevation (NSTEMI) myocardial infarction (HCC)  Acute respiratory failure with hypercarbia secondary to Acute pulmonary edema/acute on chronic congestive heart failure mrEF CTA chest with moderate pulmonary edema small bilateral pleural effusions, required BiPAP and route, has been weaned to 2 L of oxygen now after significant diuresis.  Echo shows 55% ejection fraction.  Cardiology managing.  Transitioned to oral Lasix  40 mg p.o. daily 12/07/2023.  Possible COPD exacerbation: Patient on prednisone  and bronchodilators.  Encouraged incentive spirometry.  Wean oxygen as able to.  No wheezes today.   Hypotension, possible  shock/lactic acidosis Lactic acid on admission significantly elevated at 7.5 but rapidly cleared to 2.1.  Low suspicion for infection/septic component and it is more likely that hypotension worsened due to aggressive diuretic for management of pulmonary edema with history of aggressive hemodynamic control for heart failure.  No signs of infection.  She is weaned off of the vasopressors 12/05/2023.  Blood pressure maintained.  Lactic acidosis resolved.   NSTEMI: presented with acute respiratory failure in the setting of possible COPD exacerbation with continued tobacco use. found to have elevated hsTn [id number], EKG w/diffuse anterolateral ST depression and TWI with repeat showing TWI in v5-v6 Cardiology on board and suspect she does have some degree of CAD, but recommends medical therapy for now with the plan of cardiac cath tomorrow.  She is on aspirin .   Diarrhea, reported prior to admission -GI panel and C. difficile negative.  Diarrhea improved.   Hypokalemia: Resolved.   Hepatitis C  -With remote IV drug history, unclear if treated  Debility/generalized deconditioning: Patient appears to be extremely deconditioned.  Lives alone.  Evaluated by PT OT, SNF recommended.  Will be discharged once all workup completed and cleared by cardiology.  DVT prophylaxis: heparin  injection 5,000 Units Start: 12/05/23 0600   Code Status: Full Code  Family Communication:  None present at bedside.  Plan of care discussed with patient in length and he/she verbalized understanding and agreed with it.  Status is: Inpatient Remains inpatient appropriate because: Plan of cardiac cath tomorrow.   Estimated body mass index is 16.39 kg/m as calculated from the following:   Height as of this encounter: 5' 2 (1.575 m).   Weight as of this encounter: 40.6 kg.  Wound 12/05/23 1400 Pressure Injury Sacrum Left;Medial Stage 1 -  Intact skin with non-blanchable redness of a localized area usually over a bony  prominence. (Active)   Nutritional Assessment: Body mass index is 16.39 kg/m.[name] Seen by dietician.  I agree with the assessment and plan as outlined below: Nutrition Status:        . Skin Assessment: I have examined the patient's skin and I agree with the wound assessment as performed by the wound care RN as outlined below: Wound 12/05/23 1400 Pressure Injury Sacrum Left;Medial Stage 1 -  Intact skin with non-blanchable redness of a localized area usually over a bony prominence. (Active)    Consultants:  Cardiology  Procedures:  As above  Antimicrobials:  Anti-infectives (From admission, onward)    Start     Dose/Rate Route Frequency Ordered Stop   12/05/23 0200  piperacillin -tazobactam (ZOSYN ) IVPB 3.375 g  Status:  Discontinued        3.375 g 12.5 mL/hr over 240 Minutes Intravenous Every 8 hours 12/05/23 0047 12/05/23 1211   12/04/23 1845  cefTRIAXone  (ROCEPHIN ) 2 g in sodium chloride  0.9 % 100 mL IVPB        2 g 200 mL/hr over 30 Minutes Intravenous  Once 12/04/23 1840 12/04/23 2231   12/04/23 1845  azithromycin  (ZITHROMAX ) 500 mg in sodium chloride  0.9 % 250 mL IVPB        500 mg 250 mL/hr over 60 Minutes Intravenous  Once 12/04/23 1840 12/04/23 2109         Subjective: Seen and examined.  He is extremely weak.  Has no complaints today.  Objective: Vitals:   12/06/23 2000 12/07/23 0026 12/07/23 0500 12/07/23 0805  BP: (!) 144/72 (!) 93/59 136/82 132/79  Pulse: 64  (!) 58   Resp: 16 17 17 16   Temp: 97.7 F (36.5 C) 97.6 F (36.4 C) 98.1 F (36.7 C) 98.4 F (36.9 C)  TempSrc: Oral Axillary Oral Oral  SpO2: 97%  99%   Weight:   40.6 kg   Height:        Intake/Output Summary (Last 24 hours) at 12/07/2023 0808 Last data filed at 12/07/2023 0500 Gross per 24 hour  Intake 480 ml  Output 1050 ml  Net -570 ml   Filed Weights   12/05/23 1303 12/06/23 0355 12/07/23 0500  Weight: 44.4 kg 44.5 kg 40.6 kg    Examination:  General exam: Appears calm and  comfortable  Respiratory system: Clear to auscultation. Respiratory effort normal. Cardiovascular system: S1 & S2 heard, RRR. No JVD, murmurs, rubs, gallops or clicks. No pedal edema. Gastrointestinal system: Abdomen is nondistended, soft and nontender. No organomegaly or masses felt. Normal bowel sounds heard. Central nervous system: Alert and oriented. No focal neurological deficits. Extremities: Symmetric 5 x 5 power. Skin: No rashes, lesions or ulcers  Data Reviewed: I have personally reviewed following labs and imaging studies  CBC: Recent Labs  Lab 12/04/23 1840 12/04/23 1844 12/04/23 1847 12/05/23 0122 12/06/23 0208 12/07/23 0322  WBC 8.1  --   --  6.1 10.5 11.9*  NEUTROABS 4.8  --   --   --   --   --   HGB 13.2 14.3 13.6 12.6 11.5* 12.3  HCT 41.9 42.0 40.0 38.5 33.4* 35.7*  MCV 101.7*  --   --  97.7 93.0 93.5  PLT 353  --   --  328 244 260   Basic Metabolic Panel: Recent Labs  Lab 12/04/23 1840 12/04/23 1844 12/04/23 1847 12/05/23  0122 12/06/23 0208 12/07/23 0322  NA 139 138 137 140 135 136  K 3.3* 3.2* 3.5 3.4* 3.6 3.7  CL 104  --   --  102 96* 95*  CO2 17*  --   --  23 27 27   GLUCOSE 225*  --   --  139* 104* 83  BUN 11  --   --  14 15 19   CREATININE 0.92  --   --  0.82 0.89 0.74  CALCIUM  8.5*  --   --  8.1* 8.3* 8.6*  MG  --   --   --  2.4  --   --   PHOS  --   --   --  5.1*  --   --    GFR: Estimated Creatinine Clearance: 44.3 mL/min (by C-G formula based on SCr of 0.74 mg/dL). Liver Function Tests: Recent Labs  Lab 12/06/23 0208  AST 28  ALT 18  ALKPHOS 32*  BILITOT 1.2  PROT 6.1*  ALBUMIN 2.8*   No results for input(s): LIPASE, AMYLASE in the last 168 hours. No results for input(s): AMMONIA in the last 168 hours. Coagulation Profile: Recent Labs  Lab 12/04/23 1840  INR 1.1   Cardiac Enzymes: No results for input(s): CKTOTAL, CKMB, CKMBINDEX, TROPONINI in the last 168 hours. BNP (last 3 results) No results for input(s):  PROBNP in the last 8760 hours. HbA1C: No results for input(s): HGBA1C in the last 72 hours. CBG: No results for input(s): GLUCAP in the last 168 hours. Lipid Profile: No results for input(s): CHOL, HDL, LDLCALC, TRIG, CHOLHDL, LDLDIRECT in the last 72 hours. Thyroid Function Tests: No results for input(s): TSH, T4TOTAL, FREET4, T3FREE, THYROIDAB in the last 72 hours. Anemia Panel: No results for input(s): VITAMINB12, FOLATE, FERRITIN, TIBC, IRON, RETICCTPCT in the last 72 hours. Sepsis Labs: Recent Labs  Lab 12/04/23 1851 12/04/23 2108 12/06/23 0920  LATICACIDVEN 7.5* 2.1* 1.4    Recent Results (from the past 240 hours)  Blood Culture (routine x 2)     Status: None (Preliminary result)   Collection Time: 12/04/23  6:45 PM   Specimen: BLOOD LEFT FOREARM  Result Value Ref Range Status   Specimen Description BLOOD LEFT FOREARM  Final   Special Requests   Final    BOTTLES DRAWN AEROBIC AND ANAEROBIC Blood Culture adequate volume   Culture   Final    NO GROWTH 2 DAYS Performed at [facility], [address] [place], [place] [address]    Report Status PENDING  Incomplete  Blood Culture (routine x 2)     Status: None (Preliminary result)   Collection Time: 12/04/23  8:18 PM   Specimen: BLOOD  Result Value Ref Range Status   Specimen Description BLOOD SITE NOT SPECIFIED  Final   Special Requests   Final    BOTTLES DRAWN AEROBIC ONLY Blood Culture adequate volume   Culture   Final    NO GROWTH 2 DAYS Performed at [facility], [address], [place], [place] [address]    Report Status PENDING  Incomplete  Resp panel by RT-PCR (RSV, Flu A&B, Covid) Anterior Nasal Swab     Status: None   Collection Time: 12/04/23  9:02 PM   Specimen: Anterior Nasal Swab  Result Value Ref Range Status   SARS Coronavirus 2 by RT PCR NEGATIVE NEGATIVE Final   Influenza A by PCR NEGATIVE NEGATIVE Final   Influenza B by PCR NEGATIVE NEGATIVE  Final  Comment: (NOTE) The Xpert Xpress SARS-CoV-2/FLU/RSV plus assay is intended as an aid in the diagnosis of influenza from Nasopharyngeal swab specimens and should not be used as a sole basis for treatment. Nasal washings and aspirates are unacceptable for Xpert Xpress SARS-CoV-2/FLU/RSV testing.  Fact Sheet for Patients: bloggercourse.com  Fact Sheet for Healthcare Providers: seriousbroker.it  This test is not yet approved or cleared by the [place]  FDA and has been authorized for detection and/or diagnosis of SARS-CoV-2 by FDA under an Emergency Use Authorization (EUA). This EUA will remain in effect (meaning this test can be used) for the duration of the COVID-19 declaration under Section 564(b)(1) of the Act, 21 U.S.C. section 360bbb-3(b)(1), unless the authorization is terminated or revoked.     Resp Syncytial Virus by PCR NEGATIVE NEGATIVE Final    Comment: (NOTE) Fact Sheet for Patients: bloggercourse.com  Fact Sheet for Healthcare Providers: seriousbroker.it  This test is not yet approved or cleared by the [place]  FDA and has been authorized for detection and/or diagnosis of SARS-CoV-2 by FDA under an Emergency Use Authorization (EUA). This EUA will remain in effect (meaning this test can be used) for the duration of the COVID-19 declaration under Section 564(b)(1) of the Act, 21 U.S.C. section 360bbb-3(b)(1), unless the authorization is terminated or revoked.  Performed at [facility], [address] [place], [place] [address]   C Difficile Quick Screen w PCR reflex     Status: None   Collection Time: 12/05/23 12:17 AM   Specimen: STOOL  Result Value Ref Range Status   C Diff antigen NEGATIVE NEGATIVE Final   C Diff toxin NEGATIVE NEGATIVE Final   C Diff interpretation No C. difficile detected.  Final    Comment: Performed at [facility], [address] [place], [place] [address]  Gastrointestinal Panel by PCR , Stool     Status: None   Collection Time: 12/05/23 12:17 AM   Specimen: STOOL  Result Value Ref Range Status   Campylobacter species NOT DETECTED NOT DETECTED Final   Plesimonas shigelloides NOT DETECTED NOT DETECTED Final   Salmonella species NOT DETECTED NOT DETECTED Final   Yersinia enterocolitica NOT DETECTED NOT DETECTED Final   Vibrio species NOT DETECTED NOT DETECTED Final   Vibrio cholerae NOT DETECTED NOT DETECTED Final   Enteroaggregative E coli (EAEC) NOT DETECTED NOT DETECTED Final   Enteropathogenic E coli (EPEC) NOT DETECTED NOT DETECTED Final   Enterotoxigenic E coli (ETEC) NOT DETECTED NOT DETECTED Final   Shiga like toxin producing E coli (STEC) NOT DETECTED NOT DETECTED Final   Shigella/Enteroinvasive E coli (EIEC) NOT DETECTED NOT DETECTED Final   Cryptosporidium NOT DETECTED NOT DETECTED Final   Cyclospora cayetanensis NOT DETECTED NOT DETECTED Final   Entamoeba histolytica NOT DETECTED NOT DETECTED Final   Giardia lamblia NOT DETECTED NOT DETECTED Final   Adenovirus F40/41 NOT DETECTED NOT DETECTED Final   Astrovirus NOT DETECTED NOT DETECTED Final   Norovirus GI/GII NOT DETECTED NOT DETECTED Final   Rotavirus A NOT DETECTED NOT DETECTED Final   Sapovirus (I, II, IV, and V) NOT DETECTED NOT DETECTED Final    Comment: Performed at [facility], [address] [place], [place] [address]  MRSA Next Gen by PCR, Nasal     Status: None   Collection Time: 12/05/23  1:20 AM   Specimen: Nasal Mucosa; Nasal Swab  Result Value Ref Range Status   MRSA by PCR Next Gen NOT DETECTED NOT DETECTED Final  Comment: (NOTE) The GeneXpert MRSA Assay (FDA approved for NASAL specimens only), is one component of a comprehensive MRSA colonization surveillance program. It is not intended to diagnose MRSA infection nor to guide or monitor treatment for MRSA infections. Test performance is  not FDA approved in patients less than 59 years old. Performed at [facility], [address] [place], [place] [address]      Radiology Studies: DG Chest Port 1 View Result Date: 12/06/2023 EXAM: 1 VIEW(S) XRAY OF THE CHEST 12/06/2023 03:49:00 AM COMPARISON: 12/04/2023 CLINICAL HISTORY: 68 year old female with shortness of breath. FINDINGS: LUNGS AND PLEURA: Hyperinflation. Regressed but not resolved pulmonary interstitial edema. Bilateral pleural effusions persist, and interval increased retrocardiac opacity at the left lung base could be larger pleural effusion versus increased atelectasis. No pneumothorax. HEART AND MEDIASTINUM: Aortic calcification. No acute abnormality of the cardiac and mediastinal silhouettes. BONES AND SOFT TISSUES: No acute osseous abnormality. IMPRESSION: 1. Regressed but not resolved  interstitial pulmonary edema. 2. Bilateral pleural effusions persist with increased left basilar opacity possibly larger effusion or increased atelectasis. 3. Hyperinflation. Electronically signed by: Helayne Hurst MD 12/06/2023 03:53 AM EST RP Workstation: [id number]   ECHOCARDIOGRAM COMPLETE Result Date: 12/05/2023    ECHOCARDIOGRAM REPORT   Patient Name:   Wendy [name] Date of Exam: 12/05/2023 Medical Rec #:  [id number]       Height:       62.0 in Accession #:    [id number]      Weight:       94.0 lb Date of Birth:  03/17/1955       BSA:          1.387 m Patient Age:    68 years        BP:           128/67 mmHg Patient Gender: F               HR:           70 bpm. Exam Location:  Inpatient Procedure: 2D Echo (Both Spectral and Color Flow Doppler were utilized during            procedure). Indications:    Elevated troponin  History:        Patient has prior history of Echocardiogram examinations.  Sonographer:    Charmaine Gaskins Referring Phys: [id number] DE[address] ORN HOFFMAN IMPRESSIONS  1. Small area of inferior basal akinesis . Left ventricular ejection fraction, by estimation, is 55%. The left  ventricle has normal function. The left ventricle demonstrates regional wall motion abnormalities (see scoring diagram/findings for description). Left ventricular diastolic parameters were normal.  2. Right ventricular systolic function is normal. The right ventricular size is normal.  3. Left atrial size was moderately dilated.  4. The mitral valve is abnormal. Mild mitral valve regurgitation. No evidence of mitral stenosis.  5. The aortic valve is tricuspid. There is mild calcification of the aortic valve. There is mild thickening of the aortic valve. Aortic valve regurgitation is moderate to severe. Aortic valve sclerosis is present, with no evidence of aortic valve stenosis.  6. The inferior vena cava is dilated in size with >50% respiratory variability, suggesting right atrial pressure of 8 mmHg. FINDINGS  Left Ventricle: Small area of inferior basal akinesis. Left ventricular ejection fraction, by estimation, is 55%. The left ventricle has normal function. The left ventricle demonstrates regional wall motion abnormalities. Strain was performed and the global longitudinal strain is indeterminate. The left ventricular  internal cavity size was normal in size. There is no left ventricular hypertrophy. Left ventricular diastolic parameters were normal. Right Ventricle: The right ventricular size is normal. No increase in right ventricular wall thickness. Right ventricular systolic function is normal. Left Atrium: Left atrial size was moderately dilated. Right Atrium: Right atrial size was normal in size. Pericardium: There is no evidence of pericardial effusion. Mitral Valve: The mitral valve is abnormal. There is mild thickening of the mitral valve leaflet(s). There is mild calcification of the mitral valve leaflet(s). Mild mitral valve regurgitation. No evidence of mitral valve stenosis. Tricuspid Valve: The tricuspid valve is normal in structure. Tricuspid valve regurgitation is mild . No evidence of tricuspid  stenosis. Aortic Valve: The aortic valve is tricuspid. There is mild calcification of the aortic valve. There is mild thickening of the aortic valve. Aortic valve regurgitation is moderate to severe. Aortic valve sclerosis is present, with no evidence of aortic valve stenosis. Aortic valve mean gradient measures 6.0 mmHg. Aortic valve peak gradient measures 13.5 mmHg. Aortic valve area, by VTI measures 1.98 cm. Pulmonic Valve: The pulmonic valve was normal in structure. Pulmonic valve regurgitation is not visualized. No evidence of pulmonic stenosis. Aorta: The aortic root is normal in size and structure. Venous: The inferior vena cava is dilated in size with greater than 50% respiratory variability, suggesting right atrial pressure of 8 mmHg. IAS/Shunts: No atrial level shunt detected by color flow Doppler. Additional Comments: 3D was performed not requiring image post processing on an independent workstation and was indeterminate.  LEFT VENTRICLE PLAX 2D LVIDd:         4.70 cm   Diastology LVIDs:         3.30 cm   LV e' medial:    5.98 cm/s LV PW:         1.20 cm   LV E/e' medial:  9.4 LV IVS:        1.00 cm   LV e' lateral:   5.77 cm/s LVOT diam:     1.95 cm   LV E/e' lateral: 9.7 LV SV:         62 LV SV Index:   45 LVOT Area:     2.99 cm  RIGHT VENTRICLE RV Basal diam:  2.80 cm RV Mid diam:    2.40 cm RV S prime:     15.10 cm/s LEFT ATRIUM             Index        RIGHT ATRIUM           Index LA diam:        3.40 cm 2.45 cm/m   RA Area:     10.80 cm LA Vol (A2C):   65.7 ml 47.37 ml/m  RA Volume:   19.60 ml  14.13 ml/m LA Vol (A4C):   84.8 ml 61.14 ml/m LA Biplane Vol: 79.7 ml 57.46 ml/m  AORTIC VALVE AV Area (Vmax):    2.09 cm AV Area (Vmean):   1.95 cm AV Area (VTI):     1.98 cm AV Vmax:           184.00 cm/s AV Vmean:          105.000 cm/s AV VTI:            0.316 m AV Peak Grad:      13.5 mmHg AV Mean Grad:      6.0 mmHg LVOT Vmax:  129.00 cm/s LVOT Vmean:        68.500 cm/s LVOT VTI:           0.209 m LVOT/AV VTI ratio: 0.66  AORTA Ao Root diam: 2.90 cm Ao Asc diam:  3.40 cm MITRAL VALVE MV Area (PHT): 3.54 cm    SHUNTS MV Decel Time: 214 msec    Systemic VTI:  0.21 m MV E velocity: 56.10 cm/s  Systemic Diam: 1.95 cm MV A velocity: 69.70 cm/s MV E/A ratio:  0.80 Maude Emmer MD Electronically signed by Maude Emmer MD Signature Date/Time: 12/05/2023/11:51:54 AM    Final     Scheduled Meds:  aspirin  EC  81 mg Oral Daily   furosemide   40 mg Oral Daily   heparin   5,000 Units Subcutaneous Q8H   predniSONE   50 mg Oral Q breakfast   rosuvastatin   10 mg Oral Daily   Continuous Infusions:   LOS: 2 days   Fredia Skeeter, MD [facility]  12/07/2023, 8:08 AM   *Please note that this is a verbal dictation therefore any spelling or grammatical errors are due to the Dragon Medical One system interpretation.  Please page via Amion and do not message via secure chat for urgent patient care matters. Secure chat can be used for non urgent patient care matters.  How to contact the [facility] Attending or Consulting provider 7A - 7P or covering provider during after hours 7P -7A, for this patient?  Check the care team in [facility] and look for a) attending/consulting [facility] provider listed and b) the [facility] team listed. Page or secure chat 7A-7P. Log into [email or web address] and use [place]'s universal password to access. If you do not have the password, please contact the hospital operator. Locate the [facility] provider you are looking for under [facility] and page to a number that you can be directly reached. If you still have difficulty reaching the provider, please page the [facility] (Director on Call) for the Hospitalists listed on amion for assistance.

## 2023-12-07 NOTE — NC FL2 (Signed)
 [place]  MEDICAID FL2 LEVEL OF CARE FORM     IDENTIFICATION  Patient Name: Wendy [name] Birthdate: [birth year] Sex: female Admission Date (Current Location): 12/04/2023  [facility] and [place] Number:  Producer, Television/film/video and Address:  [facility] [place], [address], [place], [place] [address]      Provider Number: [id number]  Attending Physician Name and Address:  Vernon Ranks, MD  Relative Name and Phone Number:       Current Level of Care: Hospital Recommended Level of Care: Skilled Nursing Facility Prior Approval Number:    Date Approved/Denied:   PASRR Number:   [id number]  Discharge Plan: SNF    Current Diagnoses: Patient Active Problem List   Diagnosis Date Noted   Shock (HCC) 12/05/2023   Acute pulmonary edema (HCC) 12/05/2023   Acute on chronic respiratory failure with hypercapnia (HCC) 12/05/2023   Non-ST elevation (NSTEMI) myocardial infarction [facility]) 12/05/2023   Respiratory distress 11/14/2023   QT prolongation 11/14/2023   Closed fracture nasal bone 10/12/2012   Rectal cancer (HCC) 12/20/2011   Hep C w/o coma, chronic (HCC)    HSV-1 (herpes simplex virus 1) infection    Migraine headache    History of DVT (deep vein thrombosis)    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    Orientation RESPIRATION BLADDER Height & Weight     Self, Time, Situation, Place  O2 External catheter, Continent Weight: 89 lb 9.6 oz (40.6 kg) Height:  5' 2 (157.5 cm)  BEHAVIORAL SYMPTOMS/MOOD NEUROLOGICAL BOWEL NUTRITION STATUS      Continent Diet (please see discharge summary)  AMBULATORY STATUS COMMUNICATION OF NEEDS Skin   Limited Assist Verbally Other (Comment) (pressure injury sacrum left , traumatic vetebral column medial)                       Personal Care Assistance Level of Assistance  Bathing, Feeding, Dressing Bathing Assistance: Limited assistance Feeding assistance: Independent Dressing Assistance: Limited  assistance     Functional Limitations Info  Sight, Hearing, Speech Sight Info: Impaired (glasses) Hearing Info: Adequate Speech Info: Adequate    SPECIAL CARE FACTORS FREQUENCY  PT (By licensed PT), OT (By licensed OT)     PT Frequency: 5x per week OT Frequency: 5x per week            Contractures Contractures Info: Not present    Additional Factors Info  Allergies, Code Status Code Status Info: FULL Allergies Info: Amoxicillin High Allergy Rash   Vancomycin High Allergy Itching Erythema and mild itching -  Resolved with IV Benadryl .  Erythromycin Base Medium Allergy Hives, Other (See Comments) Pt states it makes her shaky.           Current Medications (12/07/2023):  This is the current hospital active medication list Current Facility-Administered Medications  Medication Dose Route Frequency Provider Last Rate Last Admin   acetaminophen  (TYLENOL ) tablet 650 mg  650 mg Oral Q6H PRN Arloa Folks D, NP   650 mg at 12/06/23 1507   albuterol  (PROVENTIL ) (2.5 MG/3ML) 0.083% nebulizer solution 2.5 mg  2.5 mg Nebulization Q3H PRN Rosan Deward ORN, NP       aspirin  EC tablet 81 mg  81 mg Oral Daily Henry Shaver B, NP   81 mg at 12/07/23 1047   docusate sodium  (COLACE) capsule 100 mg  100 mg Oral BID PRN Rosan Deward ORN, NP       furosemide  (LASIX ) tablet 40 mg  40 mg Oral Daily Almetta Donnice LABOR, MD   40 mg at 12/07/23 1047   heparin  injection 5,000 Units  5,000 Units Subcutaneous Q8H Rosan Deward ORN, NP   5,000 Units at 12/07/23 1409   oxyCODONE  (Oxy IR/ROXICODONE ) immediate release tablet 5 mg  5 mg Oral Q6H PRN Arloa Folks D, NP   5 mg at 12/07/23 1408   polyethylene glycol (MIRALAX  / GLYCOLAX ) packet 17 g  17 g Oral Daily PRN Rosan Deward ORN, NP       predniSONE  (DELTASONE ) tablet 50 mg  50 mg Oral Q breakfast Arloa Folks D, NP   50 mg at 12/07/23 9162   rosuvastatin  (CRESTOR ) tablet 10 mg  10 mg Oral Daily Henry Shaver B, NP   10 mg at 12/07/23 1047    sodium chloride  flush (NS) 0.9 % injection 10-40 mL  10-40 mL Intracatheter PRN Layman Raisin, DO         Discharge Medications: Please see discharge summary for a list of discharge medications.  Relevant Imaging Results:  Relevant Lab Results:   Additional Information SSN [id number]  Montie LOISE Louder, LCSW

## 2023-12-07 NOTE — H&P (View-Only) (Signed)
 Progress Note  Patient Name: Wendy [name] Date of Encounter: 12/07/2023  Primary Cardiologist: Lonni Cash, MD   Patient ID  Wendy [name] is a 68 y.o. female with CAD s/p NSTEMI, HFmrEF, HTN, HLD, rectal CA s/p resection, adjuvant treatment, external radiation, DVT, tobacco use, HCV, COPD who is being followed for management of acute pulmonary edema with NSTEMI.   Subjective   No overnight events. NSR 50-60s. No recurrent SOB. Very frail, not able to get around well. At home by her self.   Inpatient Medications    Scheduled Meds:  aspirin  EC  81 mg Oral Daily   furosemide   40 mg Intravenous BID   heparin   5,000 Units Subcutaneous Q8H   predniSONE   50 mg Oral Q breakfast   rosuvastatin   10 mg Oral Daily   Continuous Infusions:  PRN Meds: acetaminophen , albuterol , docusate sodium , oxyCODONE , polyethylene glycol, sodium chloride  flush   Vital Signs    Vitals:   12/06/23 1626 12/06/23 2000 12/07/23 0026 12/07/23 0500  BP: 139/75 (!) 144/72 (!) 93/59 136/82  Pulse: 64 64  (!) 58  Resp: 17 16 17 17   Temp: 98.4 F (36.9 C) 97.7 F (36.5 C) 97.6 F (36.4 C) 98.1 F (36.7 C)  TempSrc: Oral Oral Axillary Oral  SpO2: 100% 97%  99%  Weight:    40.6 kg  Height:        Intake/Output Summary (Last 24 hours) at 12/07/2023 0629 Last data filed at 12/07/2023 0500 Gross per 24 hour  Intake 480 ml  Output 1050 ml  Net -570 ml   Filed Weights   12/05/23 1303 12/06/23 0355 12/07/23 0500  Weight: 44.4 kg 44.5 kg 40.6 kg    Telemetry    NSR 50-60s, no arrhythmias - Personally Reviewed  ECG    None today   Physical Exam   GEN: No acute distress.   Neck: No JVD Cardiac: RRR, no murmurs, rubs, or gallops.  Respiratory: diminished breath sounds at b/l bases, L > R GI: Soft, nontender, non-distended  MS: No edema; No deformity. Neuro:  Nonfocal  Psych: Normal affect   Labs    Chemistry Recent Labs  Lab 12/05/23 0122 12/06/23 0208 12/07/23 0322   NA 140 135 136  K 3.4* 3.6 3.7  CL 102 96* 95*  CO2 23 27 27   GLUCOSE 139* 104* 83  BUN 14 15 19   CREATININE 0.82 0.89 0.74  CALCIUM  8.1* 8.3* 8.6*  PROT  --  6.1*  --   ALBUMIN  --  2.8*  --   AST  --  28  --   ALT  --  18  --   ALKPHOS  --  32*  --   BILITOT  --  1.2  --   GFRNONAA >60 >60 >60  ANIONGAP 15 12 14     Hematology Recent Labs  Lab 12/05/23 0122 12/06/23 0208 12/07/23 0322  WBC 6.1 10.5 11.9*  RBC 3.94 3.59* 3.82*  HGB 12.6 11.5* 12.3  HCT 38.5 33.4* 35.7*  MCV 97.7 93.0 93.5  MCH 32.0 32.0 32.2  MCHC 32.7 34.4 34.5  RDW 15.0 15.0 14.6  PLT 328 244 260   BNP Recent Labs  Lab 12/04/23 1840  BNP 1,587.8*    Radiology    DG Chest Port 1 View Result Date: 12/06/2023 EXAM: 1 VIEW(S) XRAY OF THE CHEST 12/06/2023 03:49:00 AM COMPARISON: 12/04/2023 CLINICAL HISTORY: 68 year old female with shortness of breath. FINDINGS: LUNGS AND PLEURA: Hyperinflation. Regressed but  not resolved pulmonary interstitial edema. Bilateral pleural effusions persist, and interval increased retrocardiac opacity at the left lung base could be larger pleural effusion versus increased atelectasis. No pneumothorax. HEART AND MEDIASTINUM: Aortic calcification. No acute abnormality of the cardiac and mediastinal silhouettes. BONES AND SOFT TISSUES: No acute osseous abnormality. IMPRESSION: 1. Regressed but not resolved  interstitial pulmonary edema. 2. Bilateral pleural effusions persist with increased left basilar opacity possibly larger effusion or increased atelectasis. 3. Hyperinflation. Electronically signed by: Helayne Hurst MD 12/06/2023 03:53 AM EST RP Workstation: [id number]   ECHOCARDIOGRAM COMPLETE Result Date: 12/05/2023    ECHOCARDIOGRAM REPORT   Patient Name:   Wendy [name] Date of Exam: 12/05/2023 Medical Rec #:  [id number]       Height:       62.0 in Accession #:    [id number]      Weight:       94.0 lb Date of Birth:  04-21-1955       BSA:          1.387 m Patient Age:    68  years        BP:           128/67 mmHg Patient Gender: F               HR:           70 bpm. Exam Location:  Inpatient Procedure: 2D Echo (Both Spectral and Color Flow Doppler were utilized during            procedure). Indications:    Elevated troponin  History:        Patient has prior history of Echocardiogram examinations.  Sonographer:    Charmaine Gaskins Referring Phys: [id number] DEWARD ORN HOFFMAN IMPRESSIONS  1. Small area of inferior basal akinesis . Left ventricular ejection fraction, by estimation, is 55%. The left ventricle has normal function. The left ventricle demonstrates regional wall motion abnormalities (see scoring diagram/findings for description). Left ventricular diastolic parameters were normal.  2. Right ventricular systolic function is normal. The right ventricular size is normal.  3. Left atrial size was moderately dilated.  4. The mitral valve is abnormal. Mild mitral valve regurgitation. No evidence of mitral stenosis.  5. The aortic valve is tricuspid. There is mild calcification of the aortic valve. There is mild thickening of the aortic valve. Aortic valve regurgitation is moderate to severe. Aortic valve sclerosis is present, with no evidence of aortic valve stenosis.  6. The inferior vena cava is dilated in size with >50% respiratory variability, suggesting right atrial pressure of 8 mmHg. FINDINGS  Left Ventricle: Small area of inferior basal akinesis. Left ventricular ejection fraction, by estimation, is 55%. The left ventricle has normal function. The left ventricle demonstrates regional wall motion abnormalities. Strain was performed and the global longitudinal strain is indeterminate. The left ventricular internal cavity size was normal in size. There is no left ventricular hypertrophy. Left ventricular diastolic parameters were normal. Right Ventricle: The right ventricular size is normal. No increase in right ventricular wall thickness. Right ventricular systolic function is normal.  Left Atrium: Left atrial size was moderately dilated. Right Atrium: Right atrial size was normal in size. Pericardium: There is no evidence of pericardial effusion. Mitral Valve: The mitral valve is abnormal. There is mild thickening of the mitral valve leaflet(s). There is mild calcification of the mitral valve leaflet(s). Mild mitral valve regurgitation. No evidence of mitral valve stenosis. Tricuspid Valve: The tricuspid valve is normal in structure.  Tricuspid valve regurgitation is mild . No evidence of tricuspid stenosis. Aortic Valve: The aortic valve is tricuspid. There is mild calcification of the aortic valve. There is mild thickening of the aortic valve. Aortic valve regurgitation is moderate to severe. Aortic valve sclerosis is present, with no evidence of aortic valve stenosis. Aortic valve mean gradient measures 6.0 mmHg. Aortic valve peak gradient measures 13.5 mmHg. Aortic valve area, by VTI measures 1.98 cm. Pulmonic Valve: The pulmonic valve was normal in structure. Pulmonic valve regurgitation is not visualized. No evidence of pulmonic stenosis. Aorta: The aortic root is normal in size and structure. Venous: The inferior vena cava is dilated in size with greater than 50% respiratory variability, suggesting right atrial pressure of 8 mmHg. IAS/Shunts: No atrial level shunt detected by color flow Doppler. Additional Comments: 3D was performed not requiring image post processing on an independent workstation and was indeterminate.  LEFT VENTRICLE PLAX 2D LVIDd:         4.70 cm   Diastology LVIDs:         3.30 cm   LV e' medial:    5.98 cm/s LV PW:         1.20 cm   LV E/e' medial:  9.4 LV IVS:        1.00 cm   LV e' lateral:   5.77 cm/s LVOT diam:     1.95 cm   LV E/e' lateral: 9.7 LV SV:         62 LV SV Index:   45 LVOT Area:     2.99 cm  RIGHT VENTRICLE RV Basal diam:  2.80 cm RV Mid diam:    2.40 cm RV S prime:     15.10 cm/s LEFT ATRIUM             Index        RIGHT ATRIUM           Index LA  diam:        3.40 cm 2.45 cm/m   RA Area:     10.80 cm LA Vol (A2C):   65.7 ml 47.37 ml/m  RA Volume:   19.60 ml  14.13 ml/m LA Vol (A4C):   84.8 ml 61.14 ml/m LA Biplane Vol: 79.7 ml 57.46 ml/m  AORTIC VALVE AV Area (Vmax):    2.09 cm AV Area (Vmean):   1.95 cm AV Area (VTI):     1.98 cm AV Vmax:           184.00 cm/s AV Vmean:          105.000 cm/s AV VTI:            0.316 m AV Peak Grad:      13.5 mmHg AV Mean Grad:      6.0 mmHg LVOT Vmax:         129.00 cm/s LVOT Vmean:        68.500 cm/s LVOT VTI:          0.209 m LVOT/AV VTI ratio: 0.66  AORTA Ao Root diam: 2.90 cm Ao Asc diam:  3.40 cm MITRAL VALVE MV Area (PHT): 3.54 cm    SHUNTS MV Decel Time: 214 msec    Systemic VTI:  0.21 m MV E velocity: 56.10 cm/s  Systemic Diam: 1.95 cm MV A velocity: 69.70 cm/s MV E/A ratio:  0.80 Maude Emmer MD Electronically signed by Maude Emmer MD Signature Date/Time: 12/05/2023/11:51:54 AM    Final    Cardiac Studies  TTE Result date: 12/05/23  1. Small area of inferior basal akinesis . Left ventricular ejection  fraction, by estimation, is 55%. The left ventricle has normal function.  The left ventricle demonstrates regional wall motion abnormalities (see  scoring diagram/findings for  description). Left ventricular diastolic parameters were normal.   2. Right ventricular systolic function is normal. The right ventricular  size is normal.   3. Left atrial size was moderately dilated.   4. The mitral valve is abnormal. Mild mitral valve regurgitation. No  evidence of mitral stenosis.   5. The aortic valve is tricuspid. There is mild calcification of the  aortic valve. There is mild thickening of the aortic valve. Aortic valve  regurgitation is moderate to severe. Aortic valve sclerosis is present,  with no evidence of aortic valve  stenosis.   6. The inferior vena cava is dilated in size with >50% respiratory  variability, suggesting right atrial pressure of 8 mmHg.   Assessment & Plan   SECILIA APPS is a 68 y.o. female with CAD s/p NSTEMI, HFmrEF, HTN, HLD, rectal CA s/p resection, adjuvant treatment, external radiation, DVT, tobacco use, HCV, COPD who is being followed for management of acute pulmonary edema with NSTEMI.   NSTEMI -- presented with acute respiratory failure in the setting of possible COPD exacerbation with continued tobacco use -- found to have elevated hsTn [phone], EKG w/diffuse anterolateral ST depression and TWI with repeat showing TWI in v5-v6 -- denies any chest pain, all SOB -- previously seen 10/31 and treated medically -- continue ASA 81 mg daily  -- continue crestor  -- pending cath tentatively Monday    Hypotension Elevated lactic acid Shock  -- initial lactic acid 7.5>>2.1  -- given IV lasix , then developed hypotension regarding low dose NE. Now weaned off -- s/p lasix  40 mg bid yesterday, net negative 570, will start on lasix  40 mg PO today for maintenance, still on O2, not hooked up to oximeter, check later today to see if sats stable and will try to wean O2    Acute respiratory failure with hypercarbia COPD -- per PCCM   Acute on chronic HFmrEF -- echo 10/31 LVEF 45% now 55%, normal RV, small pericardial effusion -- CT chest with pulmonary edema, small bilateral pleural effusions . BNP 1587 -- GDMT: held for now with frequent hypotension, with preserved LVEF and borderline BP holding off on GDMT   For questions or updates, please contact [facility] Please consult [email or web address] for contact info under Cardiology/STEMI.   Signed, Donnice DELENA Primus, MD  12/07/2023, 6:29 AM

## 2023-12-07 NOTE — TOC Initial Note (Signed)
 Transition of Care [facility]) - Initial/Assessment Note    Patient Details  Name: Wendy [name] MRN: [id number] Date of Birth: 08-21-1955  [facility]) CM/SW Contact:    Montie LOISE Louder, LCSW Phone Number: 12/07/2023, 2:18 PM  Clinical Narrative:                  CSW met with patient and her son- CSW introduced self and explained role. Patient states she lives home alone but her son lives next door. CSW informed of recommendation for short term rehab at [facility]. Patient states she is agreeable to rehab. CSW explained the SNF process. All questions answered.   TOC will provide bed offers once stable.   Montie Louder, MSW, LCSW Clinical Social Worker    Expected Discharge Plan: Skilled Nursing Facility Barriers to Discharge: Continued Medical Work up   Patient Goals and CMS Choice            Expected Discharge Plan and Services In-house Referral: Clinical Social Work     Living arrangements for the past 2 months: Apartment                                      Prior Living Arrangements/Services Living arrangements for the past 2 months: Apartment Lives with:: Self Patient language and need for interpreter reviewed:: No        Need for Family Participation in Patient Care: Yes (Comment) Care giver support system in place?: Yes (comment)   Criminal Activity/Legal Involvement Pertinent to Current Situation/Hospitalization: No - Comment as needed  Activities of Daily Living   ADL Screening (condition at time of admission) Independently performs ADLs?: No Does the patient have a NEW difficulty with bathing/dressing/toileting/self-feeding that is expected to last >3 days?: No Does the patient have a NEW difficulty with getting in/out of bed, walking, or climbing stairs that is expected to last >3 days?: No Does the patient have a NEW difficulty with communication that is expected to last >3 days?: No Is the patient deaf or have difficulty hearing?:  No Does the patient have difficulty seeing, even when wearing glasses/contacts?: No Does the patient have difficulty concentrating, remembering, or making decisions?: No  Permission Sought/Granted Permission sought to share information with : Family Supports Permission granted to share information with : Yes, Verbal Permission Granted  Share Information with NAME: Kynsley Whitehouse  Permission granted to share info w AGENCY: SNFs  Permission granted to share info w Relationship: son  Permission granted to share info w Contact Information: [phone]  Emotional Assessment Appearance:: Appears older than stated age   Affect (typically observed): Accepting, Appropriate Orientation: : Oriented to Self, Oriented to Place, Oriented to  Time, Oriented to Situation Alcohol / Substance Use: Not Applicable Psych Involvement: No (comment)  Admission diagnosis:  Shock (HCC) [R57.9] COPD exacerbation (HCC) [J44.1] NSTEMI (non-ST elevated myocardial infarction) (HCC) [I21.4] Acute on chronic systolic congestive heart failure (HCC) [I50.23] Patient Active Problem List   Diagnosis Date Noted   Shock (HCC) 12/05/2023   Acute pulmonary edema (HCC) 12/05/2023   Acute on chronic respiratory failure with hypercapnia (HCC) 12/05/2023   Non-ST elevation (NSTEMI) myocardial infarction (HCC) 12/05/2023   Respiratory distress 11/14/2023   QT prolongation 11/14/2023   Closed fracture nasal bone 10/12/2012   Rectal cancer (HCC) 12/20/2011   Hep C w/o coma, chronic (HCC)    HSV-1 (herpes simplex virus 1) infection  Migraine headache    History of DVT (deep vein thrombosis)    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   PCP:  System, Provider Not In Pharmacy:   [facility] [address] [place] [address] Phone: [phone] Fax: [phone]     Social Drivers of Health (SDOH) Social History: SDOH Screenings   Food Insecurity: Food Insecurity  Present (12/05/2023)  Housing: High Risk (12/05/2023)  Transportation Needs: No Transportation Needs (12/05/2023)  Utilities: At Risk (12/05/2023)  Social Connections: Socially Isolated (12/05/2023)  Tobacco Use: High Risk (12/05/2023)   SDOH Interventions:     Readmission Risk Interventions    11/17/2023    8:29 AM  Readmission Risk Prevention Plan  Post Dischage Appt Complete  Medication Screening Complete  Transportation Screening Complete

## 2023-12-07 NOTE — Progress Notes (Signed)
 Mobility Specialist Progress Note;   12/07/23 1037  Mobility  Activity Pivoted/transferred from bed to chair;Pivoted/transferred to/from [facility]  Level of Assistance Minimal assist, patient does 75% or more  Assistive Device Other (Comment) (HHA)  Distance Ambulated (ft) 5 ft  Activity Response Tolerated fair  Mobility Referral Yes  Mobility visit 1 Mobility  Mobility Specialist Start Time (ACUTE ONLY) 1037  Mobility Specialist Stop Time (ACUTE ONLY) 1052  Mobility Specialist Time Calculation (min) (ACUTE ONLY) 15 min   Pt agreeable to mobility. On 1LO2 upon arrival. Required MinA via HHA to safely transfer to [facility] then to chair. VSS on 1LO2. VC needed to reminders and safety. Pt left in chair with all needs met, alarm on.   Lauraine Erm Mobility Specialist Please contact via SecureChat or [employer] [phone]

## 2023-12-08 ENCOUNTER — Encounter (HOSPITAL_COMMUNITY)
Admission: EM | Disposition: A | Discharge status: Skilled Nursing Facility | Payer: Self-pay | Source: Home / Self Care | Attending: Internal Medicine

## 2023-12-08 ENCOUNTER — Encounter (HOSPITAL_COMMUNITY): Payer: Self-pay | Admitting: Internal Medicine

## 2023-12-08 DIAGNOSIS — R579 Shock, unspecified: Secondary | ICD-10-CM | POA: Diagnosis not present

## 2023-12-08 DIAGNOSIS — I2510 Atherosclerotic heart disease of native coronary artery without angina pectoris: Secondary | ICD-10-CM | POA: Diagnosis not present

## 2023-12-08 HISTORY — PX: LEFT HEART CATH AND CORONARY ANGIOGRAPHY: CATH118249

## 2023-12-08 LAB — CBC
HCT: 36.7 % (ref 36.0–46.0)
Hemoglobin: 12.4 g/dL (ref 12.0–15.0)
MCH: 32.2 pg (ref 26.0–34.0)
MCHC: 33.8 g/dL (ref 30.0–36.0)
MCV: 95.3 fL (ref 80.0–100.0)
Platelets: 254 K/uL (ref 150–400)
RBC: 3.85 MIL/uL — ABNORMAL LOW (ref 3.87–5.11)
RDW: 14.6 % (ref 11.5–15.5)
WBC: 12 K/uL — ABNORMAL HIGH (ref 4.0–10.5)
nRBC: 0 % (ref 0.0–0.2)

## 2023-12-08 SURGERY — LEFT HEART CATH AND CORONARY ANGIOGRAPHY
Anesthesia: LOCAL

## 2023-12-08 MED ORDER — SODIUM CHLORIDE 0.9 % IV SOLN: 250.0000 mL | INTRAVENOUS | Status: DC | PRN

## 2023-12-08 MED ORDER — HEPARIN (PORCINE) IN NACL 1000-0.9 UT/500ML-% IV SOLN
INTRAVENOUS | Status: DC | PRN
  Administered 2023-12-08 (×2): 500 mL

## 2023-12-08 MED ORDER — SODIUM CHLORIDE 0.9% FLUSH
3.0000 mL | INTRAVENOUS | Status: DC | PRN
Start: 2023-12-08 — End: 2023-12-09

## 2023-12-08 MED ORDER — LIDOCAINE HCL (PF) 1 % IJ SOLN
INTRAMUSCULAR | Status: AC
Start: 2023-12-08 — End: 2023-12-08
  Filled 2023-12-08: qty 30

## 2023-12-08 MED ORDER — ENOXAPARIN SODIUM 40 MG/0.4ML IJ SOSY
40.0000 mg | PREFILLED_SYRINGE | INTRAMUSCULAR | Status: DC
  Administered 2023-12-09: 40 mg via SUBCUTANEOUS
  Filled 2023-12-08: qty 0.4

## 2023-12-08 MED ORDER — FREE WATER
250.0000 mL | Freq: Once | Status: AC
  Administered 2023-12-08: 250 mL via ORAL

## 2023-12-08 MED ORDER — HYDRALAZINE HCL 20 MG/ML IJ SOLN: 10.0000 mg | INTRAMUSCULAR | Status: AC | PRN

## 2023-12-08 MED ORDER — FREE WATER: 500.0000 mL | Freq: Once | Status: DC

## 2023-12-08 MED ORDER — IOHEXOL 350 MG/ML SOLN
INTRAVENOUS | Status: DC | PRN
Start: 2023-12-08 — End: 2023-12-08
  Administered 2023-12-08: 40 mL via INTRA_ARTERIAL

## 2023-12-08 MED ORDER — HEPARIN (PORCINE) IN NACL 2-0.9 UNITS/ML
INTRAMUSCULAR | Status: DC | PRN
  Administered 2023-12-08: 10 mL via INTRA_ARTERIAL

## 2023-12-08 MED ORDER — HEPARIN SODIUM (PORCINE) 1000 UNIT/ML IJ SOLN
INTRAMUSCULAR | Status: DC | PRN
Start: 2023-12-08 — End: 2023-12-08
  Administered 2023-12-08: 2000 [IU] via INTRAVENOUS

## 2023-12-08 MED ORDER — HEPARIN SODIUM (PORCINE) 1000 UNIT/ML IJ SOLN
INTRAMUSCULAR | Status: AC
  Filled 2023-12-08: qty 10

## 2023-12-08 MED ORDER — LIDOCAINE HCL (PF) 1 % IJ SOLN
INTRAMUSCULAR | Status: DC | PRN
Start: 2023-12-08 — End: 2023-12-08
  Administered 2023-12-08: 2 mL

## 2023-12-08 MED ORDER — FENTANYL CITRATE (PF) 100 MCG/2ML IJ SOLN
INTRAMUSCULAR | Status: DC | PRN
  Administered 2023-12-08: 12.5 ug via INTRAVENOUS

## 2023-12-08 MED ORDER — FENTANYL CITRATE (PF) 100 MCG/2ML IJ SOLN
INTRAMUSCULAR | Status: AC
  Filled 2023-12-08: qty 2

## 2023-12-08 MED ORDER — SODIUM CHLORIDE 0.9% FLUSH
3.0000 mL | Freq: Two times a day (BID) | INTRAVENOUS | Status: DC
  Administered 2023-12-08: 3 mL via INTRAVENOUS

## 2023-12-08 MED ORDER — VERAPAMIL HCL 2.5 MG/ML IV SOLN
INTRAVENOUS | Status: DC | PRN
  Administered 2023-12-08: 10 mL via INTRA_ARTERIAL

## 2023-12-08 MED ORDER — VERAPAMIL HCL 2.5 MG/ML IV SOLN
INTRAVENOUS | Status: AC
Start: 2023-12-08 — End: 2023-12-08
  Filled 2023-12-08: qty 2

## 2023-12-08 SURGICAL SUPPLY — 7 items
CATH INFINITI 5 FR JL3.5 (CATHETERS) IMPLANT
CATH INFINITI JR4 5F (CATHETERS) IMPLANT
DEVICE RAD TR BAND REGULAR (VASCULAR PRODUCTS) IMPLANT
GLIDESHEATH SLEND SS 6F .021 (SHEATH) IMPLANT
GUIDEWIRE INQWIRE 1.5J.035X260 (WIRE) IMPLANT
PACK CARDIAC CATHETERIZATION (CUSTOM PROCEDURE TRAY) ×2 IMPLANT
SET ATX-X65L (MISCELLANEOUS) IMPLANT

## 2023-12-08 NOTE — Progress Notes (Signed)
 PT Cancellation Note  Patient Details Name: Wendy [name] MRN: [id number] DOB: 1955-06-03   Cancelled Treatment:    Reason Eval/Treat Not Completed: Other (comment). Pt just returned from cardiac cath. Will see tomorrow.   Rodgers ORN [facility] 12/08/2023, 5:39 PM Rodgers Opal PT Acute [employer] [phone]

## 2023-12-08 NOTE — TOC Progression Note (Addendum)
 Transition of Care [facility]) - Progression Note    Patient Details  Name: Wendy [name] MRN: [id number] Date of Birth: 10/21/1955  [facility]) CM/SW Contact  Isaiah Public, LCSWA Phone Number: 12/08/2023, 1:26 PM  Clinical Narrative:     CSW spoke with patient at bedside. CSW provided patient with medicare compare ratings list of patients accepted SNF bed offers. Patient accepted SNF bed offer with [facility]. Darrian with [name] confirmed SNF bed for patient. Patient gave CSW permission to update her son [name]. CSW  updated patients son [name].CSW will continue to follow.  Expected Discharge Plan: Skilled Nursing Facility Barriers to Discharge: Continued Medical Work up               Expected Discharge Plan and Services In-house Referral: Clinical Social Work     Living arrangements for the past 2 months: Apartment                                       Social Drivers of Health (SDOH) Interventions SDOH Screenings   Food Insecurity: Food Insecurity Present (12/05/2023)  Housing: High Risk (12/05/2023)  Transportation Needs: No Transportation Needs (12/05/2023)  Utilities: At Risk (12/05/2023)  Social Connections: Socially Isolated (12/05/2023)  Tobacco Use: High Risk (12/05/2023)    Readmission Risk Interventions    11/17/2023    8:29 AM  Readmission Risk Prevention Plan  Post Dischage Appt Complete  Medication Screening Complete  Transportation Screening Complete

## 2023-12-08 NOTE — Plan of Care (Signed)

## 2023-12-08 NOTE — Progress Notes (Signed)
 Progress Note  Patient Name: Wendy [name] Date of Encounter: 12/08/2023 [facility] Cardiologist: Lonni Cash, MD   Interval Summary   Very abrupt visit today as patient was actively being wheeled down to heart catheterization procedure today.  For some reason she was removed from our rounding list so was not notified of procedure until late afternoon.  Vital Signs Vitals:   12/08/23 0500 12/08/23 0749 12/08/23 1012 12/08/23 1124  BP:  (!) 150/69 (!) 149/81   Pulse:      Resp:  18  16  Temp:  98 F (36.7 C)  98.1 F (36.7 C)  TempSrc:  Oral  Oral  SpO2:      Weight: 40.5 kg     Height:        Intake/Output Summary (Last 24 hours) at 12/08/2023 1515 Last data filed at 12/08/2023 0750 Gross per 24 hour  Intake --  Output 1200 ml  Net -1200 ml      12/08/2023    5:00 AM 12/08/2023    4:02 AM 12/07/2023    5:00 AM  Last 3 Weights  Weight (lbs) 89 lb 3.2 oz 89 lb 3.2 oz 89 lb 9.6 oz  Weight (kg) 40.461 kg 40.461 kg 40.642 kg      Telemetry/ECG  Sinus rhythm heart rates in 60s- Personally Reviewed  Physical Exam  GEN: No acute distress.  Thin and frail.  Neck: No JVD Cardiac: RRR, no murmurs, rubs, or gallops.  Respiratory: Clear to auscultation bilaterally. GI: Soft, nontender, non-distended  MS: No edema  Patient Profile Patient with past medical history significant for rectal cancer status post resection, adjuvant therapy, external radiation, DVT previously on Coumadin, tobacco use, hepatitis C, COPD, heart failure with recovered ejection fraction, coronary artery calcification, hypertension, hyperlipidemia.  10/2023 admitted for NSTEMI, treated medically felt more related to demand ischemia.  EF 45%. 11/2023 this admission admitted again for NSTEMI with lactic acidosis in the setting of acute respiratory failure requiring BiPAP.  Assessment & Plan   NSTEMI Recurrent/early rehospitalization with elevated troponins as above.  Here with  acute respiratory failure.  EKG with more pronounced ST depressions laterally and new anterior T wave inversions.  Troponin [phone].  Patient has previously denied chest pain and predominantly shortness of breath.  EF previously demonstrated mildly reduced EF 45% with wall motion abnormalities now improved.  Plan for cardiac catheterization today. Heparin  was stopped on admission. Continue with aspirin  and rosuvastatin  10 mg.  No need for beta-blocker currently with heart rates in 60s. LDL 3 weeks ago 82.  We will adjust goals depending on cardiac catheterization.  Lactic acidosis Acute respiratory failure/hypotension COPD Initial lactate was 7.5 and down trended to 2.1 very quickly.  Off vasopressors now.  EF recovered here, Shock felt related to be hypotension and acute respiratory failure.  Heart failure with recovered EF -10/2023 EF 45% with RWMA in the LAD distribution, possible Takotsubo.  Moderate AR. Echocardiogram this admission demonstrates EF 55%, with small area of inferior basal akinesis, normal RV.  Moderate to severe AR.  Appears euvolemic Continue with p.o. Lasix  40 mg daily. Valve disease will need to be monitored closely outpatient at least on echocardiograms between 6 to 12 months.  Hepatitis C Remote history of IV drug use.  Unclear if this was treated.  MD to see later after cath for more comprehensive assessment.   Informed Consent   Shared Decision Making/Informed Consent The risks [stroke (1 in 1000), death (1 in 1000), kidney failure [  usually temporary] (1 in 500), bleeding (1 in 200), allergic reaction [possibly serious] (1 in 200)], benefits (diagnostic support and management of coronary artery disease) and alternatives of a cardiac catheterization were discussed in detail with Wendy [name] and she is willing to proceed.      For questions or updates, please contact [facility] Please consult [email or web address] for contact info under        Signed, Thom LITTIE Sluder, PA-C

## 2023-12-08 NOTE — Progress Notes (Signed)
 PROGRESS NOTE    Wendy [name]  FMW:[id number] DOB: [birth year] DOA: 12/04/2023 PCP: System, Provider Not In   Brief Narrative:  68 year old female with PMH significant for adenocarcinoma of the colon s/p resection and radiation. She is a drinker and smoker with history of hepatitis C. She was recently admitted to the hospitalist service for HFrEF with concern for Takotsubo cardiomyopathy. She was diuresed and discharged on GDMT with improvement 11/3. 11/20 she again presented to [facility] ED with complaints of sudden onset shortness of breath. She deteriorated en route to ED with EMS and was placed on BiPAP. Workup in the emergency department included CT angiogram chest, which was negative for PE, but was concerning for pulmonary edema and volume overload. She was treated for COPD exacerbation and was also given 80mg  IV lasix  with markedly improved respiratory status. Shortly thereafter she became slightly hypotensive and was started on NE infusion.  Therefore she was admitted under PCCM team and ICU.  Eventually diuresed very well, weaned off of vasopressors and transferred to [facility] on 12/06/2023.  Assessment & Plan:   Principal Problem:   Shock (HCC) Active Problems:   Acute pulmonary edema (HCC)   Acute on chronic respiratory failure with hypercapnia (HCC)   Non-ST elevation (NSTEMI) myocardial infarction (HCC)  Acute respiratory failure with hypercarbia secondary to Acute pulmonary edema/acute on chronic congestive heart failure mrEF CTA chest with moderate pulmonary edema small bilateral pleural effusions, required BiPAP and route, has been weaned to 2 L of oxygen now after significant diuresis.  Echo shows 55% ejection fraction.  Cardiology managing.  Transitioned to oral Lasix  40 mg p.o. daily 12/07/2023.  Possible COPD exacerbation: Patient on prednisone  and bronchodilators.  Encouraged incentive spirometry.  Wean oxygen as able to.  No wheezes today.   Hypotension, possible  shock/lactic acidosis Lactic acid on admission significantly elevated at 7.5 but rapidly cleared to 2.1.  Low suspicion for infection/septic component and it is more likely that hypotension worsened due to aggressive diuretic for management of pulmonary edema with history of aggressive hemodynamic control for heart failure.  No signs of infection.  She is weaned off of the vasopressors 12/05/2023.  Blood pressure maintained.  Lactic acidosis resolved.   NSTEMI: presented with acute respiratory failure in the setting of possible COPD exacerbation with continued tobacco use. found to have elevated hsTn [phone], EKG w/diffuse anterolateral ST depression and TWI with repeat showing TWI in v5-v6 Cardiology on board and suspect she does have some degree of CAD, but recommends medical therapy for now with the plan of cardiac cath today.  She is on aspirin .   Diarrhea, reported prior to admission -GI panel and C. difficile negative.  Diarrhea improved.   Hypokalemia: Resolved.   Hepatitis C  -With remote IV drug history, unclear if treated  Debility/generalized deconditioning: Patient appears to be extremely deconditioned.  Lives alone.  Evaluated by PT OT, SNF recommended.  Will be discharged once all workup completed and cleared by cardiology.  DVT prophylaxis:    Code Status: Full Code  Family Communication:  None present at bedside.  Plan of care discussed with patient in length and he/she verbalized understanding and agreed with it.  Status is: Inpatient Remains inpatient appropriate because: Plan of cardiac cath tomorrow.   Estimated body mass index is 16.31 kg/m as calculated from the following:   Height as of this encounter: 5' 2 (1.575 m).   Weight as of this encounter: 40.5 kg.  Wound 12/05/23 1400 Pressure Injury  Sacrum Left;Medial Stage 1 -  Intact skin with non-blanchable redness of a localized area usually over a bony prominence. (Active)   Nutritional Assessment: Body mass  index is 16.31 kg/m.Wendy [name] Seen by dietician.  I agree with the assessment and plan as outlined below: Nutrition Status:        . Skin Assessment: I have examined the patient's skin and I agree with the wound assessment as performed by the wound care RN as outlined below: Wound 12/05/23 1400 Pressure Injury Sacrum Left;Medial Stage 1 -  Intact skin with non-blanchable redness of a localized area usually over a bony prominence. (Active)    Consultants:  Cardiology  Procedures:  As above  Antimicrobials:  Anti-infectives (From admission, onward)    Start     Dose/Rate Route Frequency Ordered Stop   12/05/23 0200  piperacillin -tazobactam (ZOSYN ) IVPB 3.375 g  Status:  Discontinued        3.375 g 12.5 mL/hr over 240 Minutes Intravenous Every 8 hours 12/05/23 0047 12/05/23 1211   12/04/23 1845  cefTRIAXone  (ROCEPHIN ) 2 g in sodium chloride  0.9 % 100 mL IVPB        2 g 200 mL/hr over 30 Minutes Intravenous  Once 12/04/23 1840 12/04/23 2231   12/04/23 1845  azithromycin  (ZITHROMAX ) 500 mg in sodium chloride  0.9 % 250 mL IVPB        500 mg 250 mL/hr over 60 Minutes Intravenous  Once 12/04/23 1840 12/04/23 2109         Subjective: Seen and examined.  Other than chronic back pain, she has no other complaint.  Objective: Vitals:   12/07/23 1954 12/08/23 0402 12/08/23 0500 12/08/23 0749  BP: (!) 140/70 (!) 146/75  (!) 150/69  Pulse: (!) 57 (!) 55    Resp: 17 17  18   Temp: 98.8 F (37.1 C) 97.7 F (36.5 C)  98 F (36.7 C)  TempSrc: Oral Oral  Oral  SpO2: 96% 98%    Weight:  40.5 kg 40.5 kg   Height:        Intake/Output Summary (Last 24 hours) at 12/08/2023 0807 Last data filed at 12/08/2023 0750 Gross per 24 hour  Intake 240 ml  Output 1200 ml  Net -960 ml   Filed Weights   12/07/23 0500 12/08/23 0402 12/08/23 0500  Weight: 40.6 kg 40.5 kg 40.5 kg    Examination:  General exam: Appears calm and comfortable  Respiratory system: Clear to auscultation. Respiratory  effort normal. Cardiovascular system: S1 & S2 heard, RRR. No JVD, murmurs, rubs, gallops or clicks. No pedal edema. Gastrointestinal system: Abdomen is nondistended, soft and nontender. No organomegaly or masses felt. Normal bowel sounds heard. Central nervous system: Alert and oriented. No focal neurological deficits. Extremities: Symmetric 5 x 5 power. Skin: No rashes, lesions or ulcers.  Psychiatry: Judgement and insight appear normal. Mood & affect appropriate.   Data Reviewed: I have personally reviewed following labs and imaging studies  CBC: Recent Labs  Lab 12/04/23 1840 12/04/23 1844 12/04/23 1847 12/05/23 0122 12/06/23 0208 12/07/23 0322 12/08/23 0342  WBC 8.1  --   --  6.1 10.5 11.9* 12.0*  NEUTROABS 4.8  --   --   --   --   --   --   HGB 13.2   < > 13.6 12.6 11.5* 12.3 12.4  HCT 41.9   < > 40.0 38.5 33.4* 35.7* 36.7  MCV 101.7*  --   --  97.7 93.0 93.5 95.3  PLT 353  --   --  328 244 260 254   < > = values in this interval not displayed.   Basic Metabolic Panel: Recent Labs  Lab 12/04/23 1840 12/04/23 1844 12/04/23 1847 12/05/23 0122 12/06/23 0208 12/07/23 0322  NA 139 138 137 140 135 136  K 3.3* 3.2* 3.5 3.4* 3.6 3.7  CL 104  --   --  102 96* 95*  CO2 17*  --   --  23 27 27   GLUCOSE 225*  --   --  139* 104* 83  BUN 11  --   --  14 15 19   CREATININE 0.92  --   --  0.82 0.89 0.74  CALCIUM  8.5*  --   --  8.1* 8.3* 8.6*  MG  --   --   --  2.4  --   --   PHOS  --   --   --  5.1*  --   --    GFR: Estimated Creatinine Clearance: 44.2 mL/min (by C-G formula based on SCr of 0.74 mg/dL). Liver Function Tests: Recent Labs  Lab 12/06/23 0208  AST 28  ALT 18  ALKPHOS 32*  BILITOT 1.2  PROT 6.1*  ALBUMIN 2.8*   No results for input(s): LIPASE, AMYLASE in the last 168 hours. No results for input(s): AMMONIA in the last 168 hours. Coagulation Profile: Recent Labs  Lab 12/04/23 1840  INR 1.1   Cardiac Enzymes: No results for input(s): CKTOTAL,  CKMB, CKMBINDEX, TROPONINI in the last 168 hours. BNP (last 3 results) No results for input(s): PROBNP in the last 8760 hours. HbA1C: No results for input(s): HGBA1C in the last 72 hours. CBG: No results for input(s): GLUCAP in the last 168 hours. Lipid Profile: No results for input(s): CHOL, HDL, LDLCALC, TRIG, CHOLHDL, LDLDIRECT in the last 72 hours. Thyroid Function Tests: No results for input(s): TSH, T4TOTAL, FREET4, T3FREE, THYROIDAB in the last 72 hours. Anemia Panel: No results for input(s): VITAMINB12, FOLATE, FERRITIN, TIBC, IRON, RETICCTPCT in the last 72 hours. Sepsis Labs: Recent Labs  Lab 12/04/23 1851 12/04/23 2108 12/06/23 0920  LATICACIDVEN 7.5* 2.1* 1.4    Recent Results (from the past 240 hours)  Blood Culture (routine x 2)     Status: None (Preliminary result)   Collection Time: 12/04/23  6:45 PM   Specimen: BLOOD LEFT FOREARM  Result Value Ref Range Status   Specimen Description BLOOD LEFT FOREARM  Final   Special Requests   Final    BOTTLES DRAWN AEROBIC AND ANAEROBIC Blood Culture adequate volume   Culture   Final    NO GROWTH 4 DAYS Performed at [facility], [address], [place], [place] [address]    Report Status PENDING  Incomplete  Blood Culture (routine x 2)     Status: None (Preliminary result)   Collection Time: 12/04/23  8:18 PM   Specimen: BLOOD  Result Value Ref Range Status   Specimen Description BLOOD SITE NOT SPECIFIED  Final   Special Requests   Final    BOTTLES DRAWN AEROBIC ONLY Blood Culture adequate volume   Culture   Final    NO GROWTH 4 DAYS Performed at [facility], [address], [place], [place] [address]    Report Status PENDING  Incomplete  Resp panel by RT-PCR (RSV, Flu A&B, Covid) Anterior Nasal Swab     Status: None   Collection Time: 12/04/23  9:02 PM   Specimen: Anterior Nasal Swab  Result Value Ref Range Status  SARS Coronavirus 2 by RT PCR NEGATIVE  NEGATIVE Final   Influenza A by PCR NEGATIVE NEGATIVE Final   Influenza B by PCR NEGATIVE NEGATIVE Final    Comment: (NOTE) The Xpert Xpress SARS-CoV-2/FLU/RSV plus assay is intended as an aid in the diagnosis of influenza from Nasopharyngeal swab specimens and should not be used as a sole basis for treatment. Nasal washings and aspirates are unacceptable for Xpert Xpress SARS-CoV-2/FLU/RSV testing.  Fact Sheet for Patients: bloggercourse.com  Fact Sheet for Healthcare Providers: seriousbroker.it  This test is not yet approved or cleared by the [place]  FDA and has been authorized for detection and/or diagnosis of SARS-CoV-2 by FDA under an Emergency Use Authorization (EUA). This EUA will remain in effect (meaning this test can be used) for the duration of the COVID-19 declaration under Section 564(b)(1) of the Act, 21 U.S.C. section 360bbb-3(b)(1), unless the authorization is terminated or revoked.     Resp Syncytial Virus by PCR NEGATIVE NEGATIVE Final    Comment: (NOTE) Fact Sheet for Patients: bloggercourse.com  Fact Sheet for Healthcare Providers: seriousbroker.it  This test is not yet approved or cleared by the [place]  FDA and has been authorized for detection and/or diagnosis of SARS-CoV-2 by FDA under an Emergency Use Authorization (EUA). This EUA will remain in effect (meaning this test can be used) for the duration of the COVID-19 declaration under Section 564(b)(1) of the Act, 21 U.S.C. section 360bbb-3(b)(1), unless the authorization is terminated or revoked.  Performed at [facility], [address], [place], [place] [address]   C Difficile Quick Screen w PCR reflex     Status: None   Collection Time: 12/05/23 12:17 AM   Specimen: STOOL  Result Value Ref Range Status   C Diff antigen NEGATIVE NEGATIVE Final   C Diff toxin NEGATIVE NEGATIVE  Final   C Diff interpretation No C. difficile detected.  Final    Comment: Performed at [facility], [address] [place], [place] [address]  Gastrointestinal Panel by PCR , Stool     Status: None   Collection Time: 12/05/23 12:17 AM   Specimen: STOOL  Result Value Ref Range Status   Campylobacter species NOT DETECTED NOT DETECTED Final   Plesimonas shigelloides NOT DETECTED NOT DETECTED Final   Salmonella species NOT DETECTED NOT DETECTED Final   Yersinia enterocolitica NOT DETECTED NOT DETECTED Final   Vibrio species NOT DETECTED NOT DETECTED Final   Vibrio cholerae NOT DETECTED NOT DETECTED Final   Enteroaggregative E coli (EAEC) NOT DETECTED NOT DETECTED Final   Enteropathogenic E coli (EPEC) NOT DETECTED NOT DETECTED Final   Enterotoxigenic E coli (ETEC) NOT DETECTED NOT DETECTED Final   Shiga like toxin producing E coli (STEC) NOT DETECTED NOT DETECTED Final   Shigella/Enteroinvasive E coli (EIEC) NOT DETECTED NOT DETECTED Final   Cryptosporidium NOT DETECTED NOT DETECTED Final   Cyclospora cayetanensis NOT DETECTED NOT DETECTED Final   Entamoeba histolytica NOT DETECTED NOT DETECTED Final   Giardia lamblia NOT DETECTED NOT DETECTED Final   Adenovirus F40/41 NOT DETECTED NOT DETECTED Final   Astrovirus NOT DETECTED NOT DETECTED Final   Norovirus GI/GII NOT DETECTED NOT DETECTED Final   Rotavirus A NOT DETECTED NOT DETECTED Final   Sapovirus (I, II, IV, and V) NOT DETECTED NOT DETECTED Final    Comment: Performed at [facility], [address] [place], [place] [address]  MRSA Next Gen by PCR, Nasal     Status: None  Collection Time: 12/05/23  1:20 AM   Specimen: Nasal Mucosa; Nasal Swab  Result Value Ref Range Status   MRSA by PCR Next Gen NOT DETECTED NOT DETECTED Final    Comment: (NOTE) The GeneXpert MRSA Assay (FDA approved for NASAL specimens only), is one component of a comprehensive MRSA colonization surveillance program. It is not intended to  diagnose MRSA infection nor to guide or monitor treatment for MRSA infections. Test performance is not FDA approved in patients less than 63 years old. Performed at [facility], [address] [place], [place] [address]      Radiology Studies: No results found.   Scheduled Meds:  aspirin  EC  81 mg Oral Daily   furosemide   40 mg Oral Daily   Influenza vac split trivalent PF  0.5 mL Intramuscular Tomorrow-1000   lisinopril   5 mg Oral Daily   predniSONE   50 mg Oral Q breakfast   rosuvastatin   10 mg Oral Daily   Continuous Infusions:   LOS: 3 days   Fredia Skeeter, MD [facility]  12/08/2023, 8:07 AM   *Please note that this is a verbal dictation therefore any spelling or grammatical errors are due to the Dragon Medical One system interpretation.  Please page via Amion and do not message via secure chat for urgent patient care matters. Secure chat can be used for non urgent patient care matters.  How to contact the [facility] Attending or Consulting provider 7A - 7P or covering provider during after hours 7P -7A, for this patient?  Check the care team in [facility] and look for a) attending/consulting [facility] provider listed and b) the [facility] team listed. Page or secure chat 7A-7P. Log into [email or web address] and use [place]'s universal password to access. If you do not have the password, please contact the hospital operator. Locate the [facility] provider you are looking for under [facility] and page to a number that you can be directly reached. If you still have difficulty reaching the provider, please page the [facility] (Director on Call) for the Hospitalists listed on amion for assistance.

## 2023-12-08 NOTE — Progress Notes (Signed)
 Mobility Specialist Progress Note;   12/08/23 0956  Mobility  Activity Ambulated with assistance;Pivoted/transferred from bed to chair  Level of Assistance Minimal assist, patient does 75% or more  Assistive Device Other (Comment) (HHA)  Distance Ambulated (ft) 10 ft  Activity Response Tolerated well  Mobility Referral Yes  Mobility visit 1 Mobility  Mobility Specialist Start Time (ACUTE ONLY) [id number]  Mobility Specialist Stop Time (ACUTE ONLY) 1004  Mobility Specialist Time Calculation (min) (ACUTE ONLY) 8 min   Pt agreeable to mobility. On 2LO2 upon arrival. Required MinA via HHA to safely ambulate around bed to chair. Ambulated on 1LO2, VSS when able to receive accurate pleth. C/o all extremities feeling weak, otherwise asx. Pt left in chair with all needs met, alarm on. RN in room.   Lauraine Erm Mobility Specialist Please contact via SecureChat or [employer] [phone]

## 2023-12-08 NOTE — Interval H&P Note (Signed)
 History and Physical Interval Note:  12/08/2023 3:36 PM  Wendy [name]  has presented today for surgery, with the diagnosis of NSTEMI.  The various methods of treatment have been discussed with the patient and family. After consideration of risks, benefits and other options for treatment, the patient has consented to  Procedure(s): LEFT HEART CATH AND CORONARY ANGIOGRAPHY (N/A) as a surgical intervention.  The patient's history has been reviewed, patient examined, no change in status, stable for surgery.  I have reviewed the patient's chart and labs.  Questions were answered to the patient's satisfaction.    Cath Lab Visit (complete for each Cath Lab visit)  Clinical Evaluation Leading to the Procedure:   ACS: Yes.    Non-ACS:  N/A  Cherita Hebel

## 2023-12-08 NOTE — Plan of Care (Signed)
  Problem: Education: Goal: Knowledge of General Education information will improve Description: Including pain rating scale, medication(s)/side effects and non-pharmacologic comfort measures Outcome: Progressing   Problem: Health Behavior/Discharge Planning: Goal: Ability to manage health-related needs will improve Outcome: Progressing   Problem: Clinical Measurements: Goal: Will remain free from infection Outcome: Progressing Goal: Respiratory complications will improve Outcome: Progressing   Problem: Activity: Goal: Risk for activity intolerance will decrease Outcome: Progressing   Problem: Nutrition: Goal: Adequate nutrition will be maintained Outcome: Progressing   Problem: Coping: Goal: Level of anxiety will decrease Outcome: Progressing   Problem: Pain Managment: Goal: General experience of comfort will improve and/or be controlled Outcome: Progressing   Problem: Safety: Goal: Ability to remain free from injury will improve Outcome: Progressing   Problem: Skin Integrity: Goal: Risk for impaired skin integrity will decrease Outcome: Progressing

## 2023-12-09 ENCOUNTER — Other Ambulatory Visit (HOSPITAL_COMMUNITY): Payer: Self-pay

## 2023-12-09 DIAGNOSIS — J9622 Acute and chronic respiratory failure with hypercapnia: Secondary | ICD-10-CM | POA: Diagnosis not present

## 2023-12-09 DIAGNOSIS — I2489 Other forms of acute ischemic heart disease: Secondary | ICD-10-CM | POA: Diagnosis not present

## 2023-12-09 DIAGNOSIS — I10 Essential (primary) hypertension: Secondary | ICD-10-CM

## 2023-12-09 DIAGNOSIS — E785 Hyperlipidemia, unspecified: Secondary | ICD-10-CM

## 2023-12-09 DIAGNOSIS — J810 Acute pulmonary edema: Secondary | ICD-10-CM | POA: Diagnosis not present

## 2023-12-09 DIAGNOSIS — R579 Shock, unspecified: Secondary | ICD-10-CM | POA: Diagnosis not present

## 2023-12-09 LAB — BASIC METABOLIC PANEL WITH GFR
Anion gap: 13 (ref 5–15)
BUN: 14 mg/dL (ref 8–23)
CO2: 29 mmol/L (ref 22–32)
Calcium: 8.3 mg/dL — ABNORMAL LOW (ref 8.9–10.3)
Chloride: 92 mmol/L — ABNORMAL LOW (ref 98–111)
Creatinine, Ser: 0.64 mg/dL (ref 0.44–1.00)
GFR, Estimated: >60 mL/min (ref >60)
Glucose, Bld: 102 mg/dL — ABNORMAL HIGH (ref 70–99)
Potassium: 3.5 mmol/L (ref 3.5–5.1)
Sodium: 134 mmol/L — ABNORMAL LOW (ref 135–145)

## 2023-12-09 LAB — CBC
HCT: 40.6 % (ref 36.0–46.0)
Hemoglobin: 13.9 g/dL (ref 12.0–15.0)
MCH: 31.8 pg (ref 26.0–34.0)
MCHC: 34.2 g/dL (ref 30.0–36.0)
MCV: 92.9 fL (ref 80.0–100.0)
Platelets: 296 K/uL (ref 150–400)
RBC: 4.37 MIL/uL (ref 3.87–5.11)
RDW: 14.5 % (ref 11.5–15.5)
WBC: 10.8 K/uL — ABNORMAL HIGH (ref 4.0–10.5)
nRBC: 0 % (ref 0.0–0.2)

## 2023-12-09 LAB — CULTURE, BLOOD (ROUTINE X 2)
Culture: NO GROWTH
Culture: NO GROWTH
Special Requests: ADEQUATE
Special Requests: ADEQUATE

## 2023-12-09 MED ORDER — ROSUVASTATIN CALCIUM 20 MG PO TABS: 20.0000 mg | ORAL_TABLET | Freq: Every day | ORAL | Status: DC

## 2023-12-09 MED ORDER — LISINOPRIL 10 MG PO TABS
10.0000 mg | ORAL_TABLET | Freq: Every day | ORAL | 0 refills | Status: AC
  Filled 2023-12-09: qty 30, 30d supply, fill #0

## 2023-12-09 MED ORDER — FUROSEMIDE 40 MG PO TABS
40.0000 mg | ORAL_TABLET | Freq: Every day | ORAL | 0 refills | Status: DC
  Filled 2023-12-09: qty 30, 30d supply, fill #0

## 2023-12-09 MED ORDER — ROSUVASTATIN CALCIUM 20 MG PO TABS
20.0000 mg | ORAL_TABLET | Freq: Every day | ORAL | 0 refills | Status: AC
  Filled 2023-12-09: qty 30, 30d supply, fill #0

## 2023-12-09 MED ORDER — LISINOPRIL 10 MG PO TABS: 10.0000 mg | ORAL_TABLET | Freq: Every day | ORAL | Status: DC

## 2023-12-09 NOTE — Progress Notes (Addendum)
 Progress Note  Patient Name: Wendy [name] Date of Encounter: 12/09/2023 [facility] Cardiologist: Lonni Cash, MD   Interval Summary    Feeling well this morning, hopeful to DC home.   Vital Signs Vitals:   12/09/23 0500 12/09/23 0756 12/09/23 0811 12/09/23 0832  BP:    (!) 148/74  Pulse:      Resp:    17  Temp:    98.1 F (36.7 C)  TempSrc:    Oral  SpO2:  94% 94%   Weight: 37 kg     Height:        Intake/Output Summary (Last 24 hours) at 12/09/2023 0950 Last data filed at 12/09/2023 0500 Gross per 24 hour  Intake 730 ml  Output 300 ml  Net 430 ml      12/09/2023    5:00 AM 12/08/2023    5:00 AM 12/08/2023    4:02 AM  Last 3 Weights  Weight (lbs) 81 lb 9.6 oz 89 lb 3.2 oz 89 lb 3.2 oz  Weight (kg) 37.014 kg 40.461 kg 40.461 kg      Telemetry/ECG   Sinus Rhythm, 4 beats NSVT - Personally Reviewed  Physical Exam  GEN: No acute distress.   Neck: No JVD Cardiac: RRR, no murmurs, rubs, or gallops.  Respiratory: Clear to auscultation bilaterally. GI: Soft, nontender, non-distended  MS: No edema  Assessment & Plan   68 yo female with past medical history significant for rectal cancer status post resection, adjuvant therapy, external radiation, DVT previously on Coumadin, tobacco use, hepatitis C, COPD, heart failure with recovered ejection fraction, coronary artery calcification, hypertension, hyperlipidemia.   Type II MI-not ACS => DEMAND ISCHEMIA -- Presented with respiratory failure and found to have elevated high-sensitivity troponin that peaked at 801.  EKG showed more pronounced ST depression in lateral leads and new T wave inversion in anterior leads.  Denied any chest pain prior to admission. -- Patient placed on IV heparin  but this was stopped on admission, as her presentation was felt to be more consistent with demand ischemia -- Underwent cardiac catheterization 11/24 with mild to moderate CAD of 40% OM 1, 65% RPAV, 15% ostial  circumflex.  Recommendations for medical therapy as RPA V lesion too small for PCI.  Suspect elevated troponin was secondary to demand ischemia. -- Continue aspirin , increase Crestor  to 20 mg daily  Acute respiratory failure COPD Elevated lactic acid -- Initial lactic acid 7.5>>1.4, in the setting of hypotension and respiratory failure -- Given IV Lasix  on admission then developed hypotension requiring low-dose nor epi -- Treated with prednisone  and bronchodilators -- Per primary   Hypertension -- Some labile readings, but most recent readings elevated -- Increase lisinopril  to 10 mg daily  Heart failure with recovered EF -- Echo 10/31 with LVEF of 45%, grade 1 diastolic dysfunction, small pericardial effusion, moderate aortic regurgitation -- Echo 11/21 with LVEF of 55%, inferior basal akinesis, normal RV, mild MR -- s/p IV lasix , net - 2.4L. Now on Lasix  40 mg p.o. daily, drop to 20mg  daily   Will arrange outpatient follow-up  For questions or updates, please contact [facility] Please consult [email or web address] for contact info under    Signed, Manuelita Rummer, NP    ATTENDING ATTESTATION  I have seen, examined and evaluated the patient this morning on rounds along with Manuelita Rummer, NP.  After reviewing all the available data and chart, we discussed the patients laboratory, study & physical findings as well as symptoms in detail.  I agree with her findings, examination as well as impression recommendations as per our discussion.    I personally reviewed cath films with Dr. Mady yesterday.  Minimal disease.  Would argue against ACS/non-STEMI and more consistent with Demand Ischemia.     [facility] will sign off.   Medication Recommendations:  Medication adjustments made-in bold italics Other recommendations (labs, testing, etc): N/A Follow up as an outpatient: Appointment scheduled.     Alm MICAEL Clay, MD, MS Alm Clay, M.D.,  M.S. Interventional Cardiologist  [facility] Pager # [phone]

## 2023-12-09 NOTE — Progress Notes (Signed)
 Physical Therapy Treatment Patient Details Name: Wendy [name] MRN: [id number] DOB: [birth year] Today's Date: 12/09/2023   History of Present Illness The pt is a 68 yo female presenting 11/20 with respiratory distress. Admitted for management of acute pulmonary edema with NSTEMI and COPD exacerbation. PMH includes: rectal cancer status post resection, adjuvant therapy, and external radiation (in 2010), history of right internal jugular DVT (previously on Coumadin), hypertension, generalized anxiety disorder, current tobacco user.    PT Comments  The pt was agreeable to session, able to make good progress with total ambulation distance, but needed significantly more assist when using cane vs HHA as pt with more frequent scissoring steps and narrow BOS needing modA to recover. Pt is motivated to progress stability, strength, and endurance. Recommendation for post-acute inpatient therapies remain appropriate.     If plan is discharge home, recommend the following: A little help with walking and/or transfers;A little help with bathing/dressing/bathroom;Assistance with cooking/housework;Direct supervision/assist for medications management;Direct supervision/assist for financial management;Assist for transportation;Help with stairs or ramp for entrance;Supervision due to cognitive status   Can travel by private vehicle     Yes  Equipment Recommendations  None recommended by PT    Recommendations for Other Services       Precautions / Restrictions Precautions Precautions: Fall Recall of Precautions/Restrictions: Impaired Precaution/Restrictions Comments: watch SpO2 Restrictions Weight Bearing Restrictions Per Provider Order: No     Mobility  Bed Mobility Overal bed mobility: Needs Assistance Bed Mobility: Supine to Sit     Supine to sit: HOB elevated, Used rails, Supervision     General bed mobility comments: supervision with use of bed features    Transfers Overall transfer  level: Needs assistance Equipment used: Straight cane Transfers: Sit to/from Stand Sit to Stand: Min assist           General transfer comment: minA to steady with initial stand, dependent on use of RUE to rise to standing    Ambulation/Gait Ambulation/Gait assistance: Min assist, Mod assist Gait Distance (Feet): 200 Feet Assistive device: Straight cane Gait Pattern/deviations: Step-through pattern, Decreased step length - right, Decreased step length - left, Scissoring, Staggering left, Staggering right, Narrow base of support Gait velocity: decreased Gait velocity interpretation: <1.31 ft/sec, indicative of household ambulator   General Gait Details: pt with x3 LOB with narrow, scissoring steps needing modA to correct, minA with any balance challenge but no overt LOB from straightforwards gait.   Stairs             Wheelchair Mobility     Tilt Bed    Modified Rankin (Stroke Patients Only)       Balance Overall balance assessment: Needs assistance Sitting-balance support: No upper extremity supported, Feet supported Sitting balance-Leahy Scale: Fair   Postural control: Posterior lean Standing balance support: Single extremity supported, No upper extremity supported, During functional activity Standing balance-Leahy Scale: Fair Standing balance comment: min-modA with balance challenge                            Communication Communication Communication: Impaired Factors Affecting Communication: Difficulty expressing self  Cognition Arousal: Alert Behavior During Therapy: WFL for tasks assessed/performed   PT - Cognitive impairments: Attention, Safety/Judgement                       PT - Cognition Comments: Pt with slow and effortful speech. Required slightly inc time to answer questions. Following commands: Impaired Following commands  impaired: Only follows one step commands consistently, Follows multi-step commands inconsistently,  Follows multi-step commands with increased time    Cueing Cueing Techniques: Verbal cues, Gestural cues  Exercises      General Comments General comments (skin integrity, edema, etc.): VSS on RA      Pertinent Vitals/Pain Pain Assessment Pain Assessment: No/denies pain     PT Goals (current goals can now be found in the care plan section) Acute Rehab PT Goals Patient Stated Goal: to get her strength back PT Goal Formulation: With patient Time For Goal Achievement: 12/20/23 Potential to Achieve Goals: Good Progress towards PT goals: Progressing toward goals    Frequency    Min 2X/week       AM-PAC PT 6 Clicks Mobility   Outcome Measure  Help needed turning from your back to your side while in a flat bed without using bedrails?: A Little Help needed moving from lying on your back to sitting on the side of a flat bed without using bedrails?: A Little Help needed moving to and from a bed to a chair (including a wheelchair)?: A Little Help needed standing up from a chair using your arms (e.g., wheelchair or bedside chair)?: A Little Help needed to walk in hospital room?: A Lot Help needed climbing 3-5 steps with a railing? : A Lot 6 Click Score: 16    End of Session Equipment Utilized During Treatment: Gait belt Activity Tolerance: Patient limited by fatigue Patient left: with [name] bell/phone within reach;in bed;with bed alarm set Nurse Communication: Mobility status;Other (comment) (left on RA) PT Visit Diagnosis: Unsteadiness on feet (R26.81);Other abnormalities of gait and mobility (R26.89);History of falling (Z91.81)     Time: 8642-8584 PT Time Calculation (min) (ACUTE ONLY): 18 min  Charges:    $Gait Training: 8-22 mins PT General Charges $$ ACUTE PT VISIT: 1 Visit                     Wendy [name], PT, DPT   Acute Rehabilitation Department Office [phone] Secure Chat Communication Preferred   Wendy [name] 12/09/2023, 3:51 PM

## 2023-12-09 NOTE — Progress Notes (Signed)
 Mobility Specialist Progress Note:  Nurse requested Mobility Specialist to perform oxygen saturation test with pt which includes removing pt from oxygen both at rest and while ambulating.  Below are the results from that testing.     Patient Saturations on Room Air at Rest = spO2 96%  Patient Saturations on Room Air while Ambulating = sp02 89% .  Rested and performed pursed lip breathing for 1 minute with sp02 at 94%.  Patient Saturations on N/A Liters of oxygen while Ambulating  At end of testing pt left in room on 0  Liters of oxygen.  Reported results to nurse.    Thersia Minder Mobility Specialist  Please contact vis Secure Chat or  Rehab Office [phone]

## 2023-12-09 NOTE — TOC Progression Note (Signed)
 Transition of Care [facility]) - Progression Note    Patient Details  Name: Wendy [name] MRN: [id number] Date of Birth: August 14, 1955  [facility] of [facility]) CM/SW Contact  Isaiah Public, LCSWA Phone Number: 12/09/2023, 10:32 AM  Clinical Narrative:     [name] with [name] confirmed patient has SNF bed at facility when medically ready. CSW informed MD.CSW will continue to follow.  Expected Discharge Plan: Skilled Nursing Facility Barriers to Discharge: Continued Medical Work up               Expected Discharge Plan and Services In-house Referral: Clinical Social Work     Living arrangements for the past 2 months: Apartment                                       Social Drivers of Health (SDOH) Interventions SDOH Screenings   Food Insecurity: Food Insecurity Present (12/05/2023)  Housing: High Risk (12/05/2023)  Transportation Needs: No Transportation Needs (12/05/2023)  Utilities: At Risk (12/05/2023)  Social Connections: Socially Isolated (12/05/2023)  Tobacco Use: High Risk (12/05/2023)    Readmission Risk Interventions    11/17/2023    8:29 AM  Readmission Risk Prevention Plan  Post Dischage Appt Complete  Medication Screening Complete  Transportation Screening Complete

## 2023-12-09 NOTE — Progress Notes (Signed)
 Mobility Specialist Progress Note:    12/09/23 1143  Mobility  Activity Ambulated with assistance  Level of Assistance Minimal assist, patient does 75% or more  Assistive Device Other (Comment) (HHA)  Distance Ambulated (ft) 50 ft (x2)  Activity Response Tolerated well  Mobility Referral Yes  Mobility visit 1 Mobility  Mobility Specialist Start Time (ACUTE ONLY) 1130  Mobility Specialist Stop Time (ACUTE ONLY) 1143  Mobility Specialist Time Calculation (min) (ACUTE ONLY) 13 min   Pt received in chair agreeable to mobility. MinA needed throughout session via HHA and d/t unsteadiness. Was able to ambulate on RA throughout session ranged from 89%-94% with x1 standing rest break. Returned to room w/o fault. Left in bed w/ call bell and personal belongings in reach. All needs met. Bed alarm on. Left on RA SPO2 96%.  Thersia Minder Mobility Specialist  Please contact vis Secure Chat or  Rehab Office [phone]

## 2023-12-09 NOTE — TOC Transition Note (Signed)
 Transition of Care [facility]) - Discharge Note   Patient Details  Name: Wendy [name] MRN: [id number] Date of Birth: Jun 17, 1955  Transition of Care [facility]) CM/SW Contact:  Isaiah Public, LCSWA Phone Number: 12/09/2023, 11:38 AM   Clinical Narrative:     Patient will DC to: [name]   Anticipated DC date: 12/09/2023  Family notified: [name]  Transport by: [facility]  ?  Per MD patient ready for DC to [facility] . RN, patient, patient's family, and facility notified of DC. Discharge Summary sent to facility. RN given number for report [phone] RM# [phone]. DC packet on chart. Ambulance transport requested for patient.  CSW signing off.   Final next level of care: Skilled Nursing Facility Barriers to Discharge: No Barriers Identified   Patient Goals and CMS Choice Patient states their goals for this hospitalization and ongoing recovery are:: SNF CMS Medicare.gov Compare Post Acute Care list provided to:: Patient Choice offered to / list presented to : Patient      Discharge Placement              Patient chooses bed at: [facility] Patient to be transferred to facility by: [facility] Name of family member notified: [name] Patient and family notified of of transfer: 12/09/23  Discharge Plan and Services Additional resources added to the After Visit Summary for   In-house Referral: Clinical Social Work                                   Social Drivers of Health (SDOH) Interventions SDOH Screenings   Food Insecurity: Food Insecurity Present (12/05/2023)  Housing: High Risk (12/05/2023)  Transportation Needs: No Transportation Needs (12/05/2023)  Utilities: At Risk (12/05/2023)  Social Connections: Socially Isolated (12/05/2023)  Tobacco Use: High Risk (12/05/2023)     Readmission Risk Interventions    11/17/2023    8:29 AM  Readmission Risk Prevention Plan  Post Dischage Appt Complete  Medication Screening Complete  Transportation Screening Complete

## 2023-12-09 NOTE — Progress Notes (Signed)
Patient resting comfortably with no respiratory distress noted.  Bipap ordered for PRN.  Not indicated at this time.  Will continue to monitor.

## 2023-12-09 NOTE — Plan of Care (Signed)
  Problem: Pain Managment: Goal: General experience of comfort will improve and/or be controlled Outcome: Progressing

## 2023-12-09 NOTE — Discharge Summary (Addendum)
 Physician Discharge Summary  Wendy [name] FMW:[id number] DOB: 1955-01-21 DOA: 12/04/2023  PCP: System, Provider Not In  Admit date: 12/04/2023 Discharge date: 12/09/2023 30 Day Unplanned Readmission Risk Score    Flowsheet Row ED to Hosp-Admission (Current) from 12/04/2023 in [facility] [facility]  30 Day Unplanned Readmission Risk Score (%) 19.54 Filed at 12/09/2023 0801    This score is the patient's risk of an unplanned readmission within 30 days of being discharged (0 -100%). The score is based on dignosis, age, lab data, medications, orders, and past utilization.   Low:  0-14.9   Medium: 15-21.9   High: 22-29.9   Extreme: 30 and above          Admitted From: Home Disposition: Home  Recommendations for Outpatient Follow-up:  Follow up with PCP in 1-2 weeks Please obtain BMP/CBC in one week Follow-up with cardiology per their scheduled time and date Please follow up with your PCP on the following pending results: Unresulted Labs (From admission, onward)     Start     Ordered   12/15/23 0500  Creatinine, serum  (enoxaparin  (LOVENOX )    CrCl >/= 30 ml/min)  Weekly,   R     Comments: while on enoxaparin  therapy    12/08/23 1629   12/09/23 0500  Lipoprotein A (LPA)  Tomorrow morning,   R        12/08/23 1629   12/06/23 0500  CBC  Daily,   R     Placed in And Linked Group   12/05/23 0001   12/04/23 1836  CBC  Once,   STAT        12/04/23 1835              Home Health: None Equipment/Devices: None  Discharge Condition: Stable CODE STATUS: Full code Diet recommendation:  Diet Order             Diet Heart Room service appropriate? Yes; Fluid consistency: Thin; Fluid restriction: 2000 mL Fluid  Diet effective now                   Subjective: Seen and examined, other than chronic back pain, she has no other complaint.  Denies any shortness of breath.  Plan of discharge to SNF discussed with her.  Brief/Interim Summary: 68 year old female  with PMH significant for adenocarcinoma of the colon s/p resection and radiation. She is a drinker and smoker with history of hepatitis C. She was recently admitted to the hospitalist service for HFrEF with concern for Takotsubo cardiomyopathy. She was diuresed and discharged on GDMT with improvement 11/3. 11/20 she again presented to [facility] ED with complaints of sudden onset shortness of breath. She deteriorated en route to ED with EMS and was placed on BiPAP. Workup in the emergency department included CT angiogram chest, which was negative for PE, but was concerning for pulmonary edema and volume overload. She was treated for COPD exacerbation and was also given 80mg  IV lasix  with markedly improved respiratory status. Shortly thereafter she became slightly hypotensive and was started on NE infusion.  Therefore she was admitted under PCCM team and ICU.  Eventually diuresed very well, weaned off of vasopressors and transferred to [facility] on 12/06/2023.  Further details below.   Acute respiratory failure with hypercarbia secondary to Acute pulmonary edema/acute on chronic congestive heart failure mrEF CTA chest with moderate pulmonary edema small bilateral pleural effusions, required BiPAP and route, has been weaned to 2 L of  oxygen now after significant diuresis.  Echo shows 55% ejection fraction.  Cardiology managing.  Transitioned to oral Lasix  40 mg p.o. daily 12/07/2023.  Cardiology recommends to discharge on Lasix  20 mg p.o. daily.  Cardiology discontinued Coreg .  She is weaned to room air.  With ability oximetry, she dropped only 89% briefly but back up to 94% on room air.  Did not qualify for oxygen.   Possible COPD exacerbation: Patient on prednisone  and bronchodilators.  Completed 5 days of therapy.  Not wheezy anymore.   Hypotension, possible shock/lactic acidosis Lactic acid on admission significantly elevated at 7.5 but rapidly cleared to 2.1.  Low suspicion for infection/septic component and it  is more likely that hypotension worsened due to aggressive diuretic for management of pulmonary edema with history of aggressive hemodynamic control for heart failure.  No signs of infection.  She is weaned off of the vasopressors 12/05/2023.  Blood pressure maintained.  Lactic acidosis resolved.  Cardiology discontinued Coreg .   NSTEMI: presented with acute respiratory failure in the setting of possible COPD exacerbation with continued tobacco use. found to have elevated hsTn [id number], EKG w/diffuse anterolateral ST depression and TWI with repeat showing TWI in v5-v6 Cardiology on board and suspect she does have some degree of CAD, but recommended medical therapy and eventually underwent cardiac cath 12/08/2023 with mild to moderate CAD of 40% OM 1, 65% RPAV, 15% ostial circumflex.  Recommendations for medical therapy as RPA V lesion too small for PCI.  Suspect elevated troponin was secondary to demand ischemia. He recommended to continue aspirin , increase Crestor  to 20 mg, increase lisinopril  to 10 mg, discontinue Coreg  and Imdur .   Diarrhea, reported prior to admission -GI panel and C. difficile negative.  Diarrhea improved.   Hypokalemia: Resolved.   Hepatitis C  -With remote IV drug history, unclear if treated   Debility/generalized deconditioning: Patient appears to be extremely deconditioned.  Lives alone.  Evaluated by PT OT, SNF recommended.  Discharging to SNF today.  Discharge Diagnoses:  Principal Problem:   Shock [facility]) Active Problems:   Acute pulmonary edema (HCC)   Acute on chronic respiratory failure with hypercapnia (HCC)   Non-ST elevation (NSTEMI) myocardial infarction [facility])    Discharge Instructions   Allergies as of 12/09/2023       Reactions   Amoxicillin Rash   Vancomycin Itching   Erythema and mild itching -  Resolved with IV Benadryl .   Erythromycin Base Hives, Other (See Comments)   Pt states it makes her shaky.        Medication List     STOP  taking these medications    carvedilol  3.125 MG tablet Commonly known as: COREG    doxycycline  100 MG tablet Commonly known as: VIBRA -TABS   isosorbide  mononitrate 30 MG 24 hr tablet Commonly known as: IMDUR        TAKE these medications    acetaminophen  325 MG tablet Commonly known as: TYLENOL  Take 650 mg by mouth every 6 (six) hours as needed for mild pain (pain score 1-3) or headache.   albuterol  108 (90 Base) MCG/ACT inhaler Commonly known as: VENTOLIN  HFA Inhale 2 puffs into the lungs every 6 (six) hours as needed for wheezing or shortness of breath.   aspirin  EC 81 MG tablet Take 1 tablet (81 mg total) by mouth daily. Swallow whole.   furosemide  40 MG tablet Commonly known as: LASIX  Take 1 tablet (40 mg total) by mouth daily. Start taking on: December 10, 2023 What changed:  how much  to take when to take this reasons to take this   lisinopril  10 MG tablet Commonly known as: ZESTRIL  Take 1 tablet (10 mg total) by mouth daily. Start taking on: December 10, 2023 What changed:  medication strength how much to take   pantoprazole  40 MG tablet Commonly known as: PROTONIX  Take 1 tablet (40 mg total) by mouth daily.   rosuvastatin  20 MG tablet Commonly known as: CRESTOR  Take 1 tablet (20 mg total) by mouth daily. Start taking on: December 10, 2023 What changed:  medication strength how much to take        Contact information for follow-up providers     PCP Follow up in 1 week(s).               Contact information for after-discharge care     Destination     [facility] .   Service: Skilled Nursing Contact information: [address] [place]  [address] [phone]                    Allergies  Allergen Reactions   Amoxicillin Rash   Vancomycin Itching    Erythema and mild itching -  Resolved with IV Benadryl .   Erythromycin Base Hives and Other (See Comments)    Pt states it  makes her shaky.    Consultations: Critical care, cardiology   Procedures/Studies: CARDIAC CATHETERIZATION Result Date: 12/08/2023   1st Mrg lesion is 40% stenosed.   Ost Cx to Prox Cx lesion is 15% stenosed.   Prox RCA lesion is 20% stenosed.   RPAV lesion is 65% stenosed.   LV end diastolic pressure is normal.   There is no aortic valve stenosis.   In the absence of any other complications or medical issues, we expect the patient to be ready for discharge from a cath perspective on 12/09/2023.   Recommend Aspirin  81mg  daily for moderate CAD. Conclusions: Mild to moderate coronary artery disease, as detailed below.  Most severe lesion is a 60-70% stenosis in distal rPAV, which is too small for PCI.  Suspect elevated troponin is due to supply-demand mismatch. Normal left ventricular filling pressure (LVEDP 8 mmHg). Recommendations: Continue medical therapy and risk factor modification to prevent progression of coronary artery disease. Maintain net even fluid balance. [name], MD [facility]  DG Chest Port 1 View Result Date: 12/06/2023 EXAM: 1 VIEW(S) XRAY OF THE CHEST 12/06/2023 03:49:00 AM COMPARISON: 12/04/2023 CLINICAL HISTORY: 68 year old female with shortness of breath. FINDINGS: LUNGS AND PLEURA: Hyperinflation. Regressed but not resolved pulmonary interstitial edema. Bilateral pleural effusions persist, and interval increased retrocardiac opacity at the left lung base could be larger pleural effusion versus increased atelectasis. No pneumothorax. HEART AND MEDIASTINUM: Aortic calcification. No acute abnormality of the cardiac and mediastinal silhouettes. BONES AND SOFT TISSUES: No acute osseous abnormality. IMPRESSION: 1. Regressed but not resolved  interstitial pulmonary edema. 2. Bilateral pleural effusions persist with increased left basilar opacity possibly larger effusion or increased atelectasis. 3. Hyperinflation. Electronically signed by: Helayne Hurst MD 12/06/2023 03:53 AM EST  RP Workstation: [id number]   ECHOCARDIOGRAM COMPLETE Result Date: 12/05/2023    ECHOCARDIOGRAM REPORT   Patient Name:   CAILEEN VERACRUZ Date of Exam: 12/05/2023 Medical Rec #:  [id number]       Height:       62.0 in Accession #:    [id number]      Weight:       94.0 lb Date of  Birth:  1955-06-16       BSA:          1.387 m Patient Age:    68 years        BP:           128/67 mmHg Patient Gender: F               HR:           70 bpm. Exam Location:  Inpatient Procedure: 2D Echo (Both Spectral and Color Flow Doppler were utilized during            procedure). Indications:    Elevated troponin  History:        Patient has prior history of Echocardiogram examinations.  Sonographer:    Charmaine Gaskins Referring Phys: [id number] DEWARD ORN HOFFMAN IMPRESSIONS  1. Small area of inferior basal akinesis . Left ventricular ejection fraction, by estimation, is 55%. The left ventricle has normal function. The left ventricle demonstrates regional wall motion abnormalities (see scoring diagram/findings for description). Left ventricular diastolic parameters were normal.  2. Right ventricular systolic function is normal. The right ventricular size is normal.  3. Left atrial size was moderately dilated.  4. The mitral valve is abnormal. Mild mitral valve regurgitation. No evidence of mitral stenosis.  5. The aortic valve is tricuspid. There is mild calcification of the aortic valve. There is mild thickening of the aortic valve. Aortic valve regurgitation is moderate to severe. Aortic valve sclerosis is present, with no evidence of aortic valve stenosis.  6. The inferior vena cava is dilated in size with >50% respiratory variability, suggesting right atrial pressure of 8 mmHg. FINDINGS  Left Ventricle: Small area of inferior basal akinesis. Left ventricular ejection fraction, by estimation, is 55%. The left ventricle has normal function. The left ventricle demonstrates regional wall motion abnormalities. Strain was performed and the global  longitudinal strain is indeterminate. The left ventricular internal cavity size was normal in size. There is no left ventricular hypertrophy. Left ventricular diastolic parameters were normal. Right Ventricle: The right ventricular size is normal. No increase in right ventricular wall thickness. Right ventricular systolic function is normal. Left Atrium: Left atrial size was moderately dilated. Right Atrium: Right atrial size was normal in size. Pericardium: There is no evidence of pericardial effusion. Mitral Valve: The mitral valve is abnormal. There is mild thickening of the mitral valve leaflet(s). There is mild calcification of the mitral valve leaflet(s). Mild mitral valve regurgitation. No evidence of mitral valve stenosis. Tricuspid Valve: The tricuspid valve is normal in structure. Tricuspid valve regurgitation is mild . No evidence of tricuspid stenosis. Aortic Valve: The aortic valve is tricuspid. There is mild calcification of the aortic valve. There is mild thickening of the aortic valve. Aortic valve regurgitation is moderate to severe. Aortic valve sclerosis is present, with no evidence of aortic valve stenosis. Aortic valve mean gradient measures 6.0 mmHg. Aortic valve peak gradient measures 13.5 mmHg. Aortic valve area, by VTI measures 1.98 cm. Pulmonic Valve: The pulmonic valve was normal in structure. Pulmonic valve regurgitation is not visualized. No evidence of pulmonic stenosis. Aorta: The aortic root is normal in size and structure. Venous: The inferior vena cava is dilated in size with greater than 50% respiratory variability, suggesting right atrial pressure of 8 mmHg. IAS/Shunts: No atrial level shunt detected by color flow Doppler. Additional Comments: 3D was performed not requiring image post processing on an independent workstation and was indeterminate.  LEFT VENTRICLE PLAX 2D LVIDd:  4.70 cm   Diastology LVIDs:         3.30 cm   LV e' medial:    5.98 cm/s LV PW:         1.20 cm    LV E/e' medial:  9.4 LV IVS:        1.00 cm   LV e' lateral:   5.77 cm/s LVOT diam:     1.95 cm   LV E/e' lateral: 9.7 LV SV:         62 LV SV Index:   45 LVOT Area:     2.99 cm  RIGHT VENTRICLE RV Basal diam:  2.80 cm RV Mid diam:    2.40 cm RV S prime:     15.10 cm/s LEFT ATRIUM             Index        RIGHT ATRIUM           Index LA diam:        3.40 cm 2.45 cm/m   RA Area:     10.80 cm LA Vol (A2C):   65.7 ml 47.37 ml/m  RA Volume:   19.60 ml  14.13 ml/m LA Vol (A4C):   84.8 ml 61.14 ml/m LA Biplane Vol: 79.7 ml 57.46 ml/m  AORTIC VALVE AV Area (Vmax):    2.09 cm AV Area (Vmean):   1.95 cm AV Area (VTI):     1.98 cm AV Vmax:           184.00 cm/s AV Vmean:          105.000 cm/s AV VTI:            0.316 m AV Peak Grad:      13.5 mmHg AV Mean Grad:      6.0 mmHg LVOT Vmax:         129.00 cm/s LVOT Vmean:        68.500 cm/s LVOT VTI:          0.209 m LVOT/AV VTI ratio: 0.66  AORTA Ao Root diam: 2.90 cm Ao Asc diam:  3.40 cm MITRAL VALVE MV Area (PHT): 3.54 cm    SHUNTS MV Decel Time: 214 msec    Systemic VTI:  0.21 m MV E velocity: 56.10 cm/s  Systemic Diam: 1.95 cm MV A velocity: 69.70 cm/s MV E/A ratio:  0.80 Maude Emmer MD Electronically signed by Maude Emmer MD Signature Date/Time: 12/05/2023/11:51:54 AM    Final    CT Angio Chest PE W and/or Wo Contrast Result Date: 12/04/2023 EXAM: CTA CHEST AORTA 12/04/2023 09:27:33 PM TECHNIQUE: CTA of the chest was performed without and with the administration of intravenous contrast. 50 mL (iohexol  (OMNIPAQUE ) 350 MG/ML injection 50 mL IOHEXOL  350 MG/ML SOLN). Multiplanar reformatted images are provided for review. MIP images are provided for review. Automated exposure control, iterative reconstruction, and/or weight based adjustment of the mA/kV was utilized to reduce the radiation dose to as low as reasonably achievable. COMPARISON: Prior examination of 11/14/2023 and remote prior examination of 01/31/2011. CLINICAL HISTORY: evaluate for possible PE  FINDINGS: AORTA: Mild atherosclerotic calcification within the thoracic aorta. No aortic aneurysm. No thoracic aortic dissection. MEDIASTINUM: Minimal coronary artery calcification. Global cardiac size within normal limits. The heart and pericardium demonstrate no acute abnormality. No mediastinal lymphadenopathy. LYMPH NODES: No mediastinal, hilar or axillary lymphadenopathy. LUNGS AND PLEURA: There is suboptimal opacification of the pulmonary arterial tree due to bolus timing. Examination is adequate only for exclusion  of large pulmonary embolus within the main and central right and left pulmonary arteries. The lobar, segmental and subsegmental pulmonary arteries are not well assessed on this examination. Central pulmonary arteries are of normal caliber. 5 mm microlobulated nodule within the right apex is unchanged from prior examination of 11/14/2023 but new from remote prior examination of 01/31/2011. Moderate emphysema. Superimposed smooth interlobular septal thickening and ground glass infiltrate as well as small bilateral pleural effusions are in keeping with moderate cardiogenic failure with moderate interstitial and alveolar pulmonary edema. Bibasilar atelectasis is present. No pneumothorax. Right mainstem bronchus and left lower lobe bronchus. UPPER ABDOMEN: Limited images of the upper abdomen are unremarkable. SOFT TISSUES AND BONES: Osseous structures are age appropriate. No acute bone abnormality. No lytic or blastic bone lesion. No acute soft tissue abnormality. IMPRESSION: 1. No evidence of large pulmonary embolus within the main and central right and left pulmonary arteries. Lobar, segmental, and subsegmental pulmonary arteries are not well assessed due to suboptimal opacification. 2. Moderate emphysema. Given that emphysema is an independent risk factor for lung cancer and assuming typical screening age, consider evaluation for a low-dose CT lung cancer screening program if the patient meets  eligibility criteria. 3. Findings consistent with moderate cardiogenic failure with moderate interstitial and alveolar pulmonary edema, with small bilateral pleural effusions and bibasilar atelectasis. 4. 5 mm microlobulated right apical pulmonary nodule, unchanged from 11/14/2023 and new from 01/31/2011. For an incidental solid nodule of 05 mm without specified high-risk features, no routine follow-up imaging is recommended per Fleischner Society Guidelines; if the patient is considered high risk or the nodule has high-risk features (including upper lobe location), optional chest CT at 12 months may be considered; if stable at 12 months, no further follow-up. Electronically signed by: Dorethia Molt MD 12/04/2023 10:06 PM EST RP Workstation: [id number]   DG Chest Port 1 View Result Date: 12/04/2023 CLINICAL DATA:  Questionable sepsis. EXAM: PORTABLE CHEST 1 VIEW COMPARISON:  Chest radiograph dated 11/15/2023 FINDINGS: Background of emphysema and diffuse interstitial coarsening slightly progressed since the prior radiograph. Superimposed pneumonia is not excluded. No consolidative changes. No large pleural effusion or pneumothorax. Stable cardiac silhouette. No acute osseous pathology. IMPRESSION: Emphysema and diffuse interstitial coarsening slightly progressed since the prior radiograph. Superimposed pneumonia is not excluded. Electronically Signed   By: Vanetta Chou M.D.   On: 12/04/2023 19:39   VAS US  LOWER EXTREMITY VENOUS (DVT) Result Date: 11/16/2023  Lower Venous DVT Study Patient Name:  MERARY GARGUILO  Date of Exam:   11/15/2023 Medical Rec #: [id number]        Accession #:    [id number] Date of Birth: 08/08/1955        Patient Gender: F Patient Age:   35 years Exam Location:  [facility] Procedure:      VAS US  LOWER EXTREMITY VENOUS (DVT) Referring Phys: LAVADA [facility] --------------------------------------------------------------------------------  Indications: SOB, and Elevated D-dimer.   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 Comparison Study: No prior study on file Performing Technologist: Alberta Lis RVS  Examination Guidelines: A complete evaluation includes B-mode imaging, spectral Doppler, color Doppler, and power Doppler as needed of all accessible portions of each vessel. Bilateral testing is considered an integral part of a complete examination. Limited examinations for reoccurring indications may be performed as noted. The reflux portion of the exam is performed with the patient in reverse Trendelenburg.  +---------+---------------+---------+-----------+----------+--------------+ RIGHT    CompressibilityPhasicitySpontaneityPropertiesThrombus Aging +---------+---------------+---------+-----------+----------+--------------+ CFV  Full           Yes      No                                  +---------+---------------+---------+-----------+----------+--------------+ SFJ      Full                                                        +---------+---------------+---------+-----------+----------+--------------+ FV Prox  Full                                                        +---------+---------------+---------+-----------+----------+--------------+ FV Mid   Full                                                        +---------+---------------+---------+-----------+----------+--------------+ FV DistalFull                                                        +---------+---------------+---------+-----------+----------+--------------+ PFV      Full                                                        +---------+---------------+---------+-----------+----------+--------------+ POP      Full           Yes      Yes                                 +---------+---------------+---------+-----------+----------+--------------+ PTV      Full                                                         +---------+---------------+---------+-----------+----------+--------------+ PERO     Full                                                        +---------+---------------+---------+-----------+----------+--------------+ Gastroc  Full                                                        +---------+---------------+---------+-----------+----------+--------------+   +---------+---------------+---------+-----------+----------+--------------+  LEFT     CompressibilityPhasicitySpontaneityPropertiesThrombus Aging +---------+---------------+---------+-----------+----------+--------------+ CFV      Full           Yes      Yes                                 +---------+---------------+---------+-----------+----------+--------------+ SFJ      Full                                                        +---------+---------------+---------+-----------+----------+--------------+ FV Prox  Full                                                        +---------+---------------+---------+-----------+----------+--------------+ FV Mid   Full                                                        +---------+---------------+---------+-----------+----------+--------------+ FV DistalFull                                                        +---------+---------------+---------+-----------+----------+--------------+ PFV      Full                                                        +---------+---------------+---------+-----------+----------+--------------+ POP      Full           Yes      Yes                                 +---------+---------------+---------+-----------+----------+--------------+ PTV      Full                                                        +---------+---------------+---------+-----------+----------+--------------+ PERO     Full                                                         +---------+---------------+---------+-----------+----------+--------------+ Gastroc  Full                                                        +---------+---------------+---------+-----------+----------+--------------+  Summary: RIGHT: - There is no evidence of deep vein thrombosis in the lower extremity.  - No cystic structure found in the popliteal fossa. Interstitial edema noted throughout  LEFT: - There is no evidence of deep vein thrombosis in the lower extremity.  - No cystic structure found in the popliteal fossa. Interstitial edema noted throughout.  *See table(s) above for measurements and observations. Electronically signed by Norman Serve on 11/16/2023 at 8:32:40 AM.    Final    DG Chest Port 1 View Result Date: 11/15/2023 CLINICAL DATA:  Shortness of breath. EXAM: PORTABLE CHEST 1 VIEW COMPARISON:  11/14/2023 FINDINGS: The heart size and mediastinal contours are within normal limits. Mildly increased opacity in right lung base, suspicious for developing infiltrate. No pleural effusion or pneumothorax seen. IMPRESSION: Mildly increased opacity in right lung base, suspicious for developing infiltrate/pneumonia. COPD. Electronically Signed   By: Norleen DELENA Kil M.D.   On: 11/15/2023 06:08   CT Angio Chest Pulmonary Embolism (PE) W or WO Contrast Result Date: 11/14/2023 CLINICAL DATA:  Acute onset shortness of breath and tachycardia EXAM: CT ANGIOGRAPHY CHEST WITH CONTRAST TECHNIQUE: Multidetector CT imaging of the chest was performed using the standard protocol during bolus administration of intravenous contrast. Multiplanar CT image reconstructions and MIPs were obtained to evaluate the vascular anatomy. RADIATION DOSE REDUCTION: This exam was performed according to the departmental dose-optimization program which includes automated exposure control, adjustment of the mA and/or kV according to patient size and/or use of iterative reconstruction technique. CONTRAST:  75mL OMNIPAQUE  IOHEXOL   350 MG/ML SOLN COMPARISON:  Same day chest radiograph, CT chest dated 01/31/2011 FINDINGS: Cardiovascular: The study is high quality for the evaluation of pulmonary embolism. There are no filling defects in the central, lobar, segmental or subsegmental pulmonary artery branches to suggest acute pulmonary embolism. Great vessels are normal in course and caliber. Normal heart size. No significant pericardial fluid/thickening. Coronary artery calcifications and aortic atherosclerosis. Reflux of contrast material into the hepatic veins, suggesting a degree of right heart dysfunction. Mediastinum/Nodes: Imaged thyroid gland without nodules meeting criteria for imaging follow-up by size. Normal esophagus. No pathologically enlarged axillary, supraclavicular, mediastinal, or hilar lymph nodes. 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 Lungs/Pleura: The central airways are patent. Trace layering secretions in the trachea extending into bilateral bronchi. Moderate centrilobular and paraseptal emphysema. Right apical 6 x 3 mm nodule (7:7), unchanged from 01/31/2011, likely benign, favored scarring. Slightly irregular 5 x 2 mm perifissural left upper lobe nodule (7:49), likely perifissural lymph node. Bilateral lower lobe dependent atelectasis/consolidation. No pneumothorax. Trace bilateral pleural effusions, right-greater-than-left. Upper abdomen: Normal. Musculoskeletal: No acute or abnormal lytic or blastic osseous lesions. Review of the MIP images confirms the above findings. IMPRESSION: 1. No evidence of pulmonary embolism. 2. Trace bilateral pleural effusions, right-greater-than-left width bilateral lower lobe dependent atelectasis/consolidation. Superimposed aspiration or pneumonia is not excluded. 3. Reflux of contrast material into the hepatic  veins, suggesting a degree of right heart dysfunction. 4. Aortic Atherosclerosis (ICD10-I70.0) and Emphysema (ICD10-J43.9). Coronary artery calcifications. Assessment for potential risk factor modification, dietary therapy or pharmacologic therapy may be warranted, if clinically indicated. Electronically Signed   By: Limin  Xu M.D.   On: 11/14/2023 16:39   ECHOCARDIOGRAM COMPLETE Result Date: 11/14/2023    ECHOCARDIOGRAM REPORT   Patient Name:   TWANISHA FOULK Date of Exam: 11/14/2023 Medical Rec #:  [id number]  Height:       62.5 in Accession #:    [id number]      Weight:       104.2 lb Date of Birth:  November 27, 1955       BSA:          1.458 m Patient Age:    68 years        BP:           111/74 mmHg Patient Gender: F               HR:           74 bpm. Exam Location:  Inpatient Procedure: 2D Echo and Intracardiac Opacification Agent (Both Spectral and Color            Flow Doppler were utilized during procedure). Indications:    Chest pain  History:        Patient has no prior history of Echocardiogram examinations.  Sonographer:    Charmaine Gaskins Referring Phys: [id number] [employer]TRY[employer] IMPRESSIONS  1. Left ventricular ejection fraction, by estimation, is 45%. Left ventricular ejection fraction by 2D MOD biplane is 48.7 %. The left ventricle has mildly decreased function. The left ventricle demonstrates regional wall motion abnormalities (see scoring diagram/findings for description). The left ventricular internal cavity size was mildly dilated. Left ventricular diastolic parameters are consistent with Grade I diastolic dysfunction (impaired relaxation).  2. Right ventricular systolic function is normal. The right ventricular size is normal. There is normal pulmonary artery systolic pressure.  3. Left atrial size was mildly dilated.  4. A small pericardial effusion is present. There is no evidence of cardiac tamponade.  5. The mitral valve is normal in structure. Trivial mitral valve regurgitation. No  evidence of mitral stenosis.  6. The aortic valve is calcified. Aortic valve regurgitation is moderate. Aortic valve sclerosis is present, with no evidence of aortic valve stenosis.  7. The inferior vena cava is normal in size with greater than 50% respiratory variability, suggesting right atrial pressure of 3 mmHg. Conclusion(s)/Recommendation(s): Mildly reduced LV systolic function = 45%. Regional wall motion abnormalities in an LAD distribution. Relative preservation of the basal contractility which can be seen in [place] as well. Moderate AR. Recommend cardiology consultation for further evaluation. FINDINGS  Left Ventricle: Left ventricular ejection fraction, by estimation, is 45%. Left ventricular ejection fraction by 2D MOD biplane is 48.7 %. The left ventricle has mildly decreased function. The left ventricle demonstrates regional wall motion abnormalities. Definity  contrast agent was given IV to delineate the left ventricular endocardial borders. The left ventricular internal cavity size was mildly dilated. There is no left ventricular hypertrophy. Left ventricular diastolic parameters are consistent with Grade I diastolic dysfunction (impaired relaxation).  LV Wall Scoring: The apical lateral segment, mid inferoseptal segment, apical septal segment, and apex are akinetic. The antero-lateral wall and basal inferoseptal segment are normal. Right Ventricle: The right ventricular size is normal. No increase in right ventricular wall thickness. Right ventricular systolic function is normal. There is normal pulmonary artery systolic pressure. The tricuspid regurgitant velocity is 2.54 m/s, and  with an assumed right atrial pressure of 3 mmHg, the estimated right ventricular systolic pressure is 28.8 mmHg. Left Atrium: Left atrial size was mildly dilated. Right Atrium: Right atrial size was normal in size. Pericardium: A small pericardial effusion is present. There is no evidence of cardiac tamponade. Mitral  Valve: The mitral valve is normal in structure. Trivial mitral valve regurgitation. No evidence of mitral valve stenosis.  Tricuspid Valve: The tricuspid valve is normal in structure. Tricuspid valve regurgitation is mild . No evidence of tricuspid stenosis. Aortic Valve: The aortic valve is calcified. Aortic valve regurgitation is moderate. Aortic valve sclerosis is present, with no evidence of aortic valve stenosis. Pulmonic Valve: The pulmonic valve was normal in structure. Pulmonic valve regurgitation is not visualized. No evidence of pulmonic stenosis. Aorta: The aortic root and ascending aorta are structurally normal, with no evidence of dilitation. Venous: The inferior vena cava is normal in size with greater than 50% respiratory variability, suggesting right atrial pressure of 3 mmHg. IAS/Shunts: No atrial level shunt detected by color flow Doppler.  LEFT VENTRICLE PLAX 2D                        Biplane EF (MOD) LVIDd:         4.90 cm         LV Biplane EF:   Left LVIDs:         2.20 cm                          ventricular LV PW:         1.00 cm                          ejection LV IVS:        1.00 cm                          fraction by LVOT diam:     2.10 cm                          2D MOD LVOT Area:     3.46 cm                         biplane is                                                 48.7 %.  LV Volumes (MOD)               Diastology LV vol d, MOD    82.0 ml       LV e' medial:    5.33 cm/s A2C:                           LV E/e' medial:  8.3 LV vol d, MOD    101.0 ml      LV e' lateral:   3.59 cm/s A4C:                           LV E/e' lateral: 12.4 LV vol s, MOD    42.4 ml A2C: LV vol s, MOD    50.6 ml A4C: LV SV MOD A2C:   39.6 ml LV SV MOD A4C:   101.0 ml LV SV MOD BP:    44.7 ml RIGHT VENTRICLE RV Basal diam:  3.10 cm RV Mid diam:    2.60 cm RV S prime:     24.90 cm/s RVSP:  28.8 mmHg LEFT ATRIUM             Index        RIGHT ATRIUM           Index LA diam:        3.10 cm 2.13  cm/m   RA Pressure: 3.00 mmHg LA Vol (A2C):   66.8 ml 45.83 ml/m  RA Area:     12.90 cm LA Vol (A4C):   61.8 ml 42.40 ml/m  RA Volume:   30.20 ml  20.72 ml/m LA Biplane Vol: 65.4 ml 44.87 ml/m   AORTA Ao Root diam: 2.80 cm Ao Asc diam:  2.80 cm MITRAL VALVE               TRICUSPID VALVE MV Area (PHT): 4.31 cm    TR Peak grad:   25.8 mmHg MV Decel Time: 176 msec    TR Vmax:        254.00 cm/s MV E velocity: 44.50 cm/s  Estimated RAP:  3.00 mmHg MV A velocity: 73.90 cm/s  RVSP:           28.8 mmHg MV E/A ratio:  0.60                            SHUNTS                            Systemic Diam: 2.10 cm Georganna Archer Electronically signed by Georganna Archer Signature Date/Time: 11/14/2023/11:18:14 AM    Final    DG Chest Portable 1 View Result Date: 11/14/2023 EXAM: 1 VIEW(S) XRAY OF THE CHEST 11/14/2023 02:34:35 AM COMPARISON: None available. CLINICAL HISTORY: SOB SOB FINDINGS: LUNGS AND PLEURA: Lungs are hyperinflated in keeping with changes of underlying COPD. Interval development of diffuse mild interstitial thickening, or purulent lung base is most in keeping with mild pulmonary edema. No pleural effusion. No pneumothorax. HEART AND MEDIASTINUM: Cardiac size within normal limits. BONES AND SOFT TISSUES: No acute osseous abnormality. IMPRESSION: 1. Interval development of diffuse mild interstitial thickening, most in keeping with mild pulmonary edema. 2. Hyperinflated lungs with changes of underlying COPD. Electronically signed by: Dorethia Molt MD 11/14/2023 03:38 AM EDT RP Workstation: [id number]     Discharge Exam: Vitals:   12/09/23 0811 12/09/23 0832  BP:  (!) 148/74  Pulse:    Resp:  17  Temp:  98.1 F (36.7 C)  SpO2: 94%    Vitals:   12/09/23 0500 12/09/23 0756 12/09/23 0811 12/09/23 0832  BP:    (!) 148/74  Pulse:      Resp:    17  Temp:    98.1 F (36.7 C)  TempSrc:    Oral  SpO2:  94% 94%   Weight: 37 kg     Height:        General: Pt is alert, awake, not in acute  distress Cardiovascular: RRR, S1/S2 +, no rubs, no gallops Respiratory: CTA bilaterally, no wheezing, no rhonchi Abdominal: Soft, NT, ND, bowel sounds + Extremities: no edema, no cyanosis    The results of significant diagnostics from this hospitalization (including imaging, microbiology, ancillary and laboratory) are listed below for reference.     Microbiology: Recent Results (from the past 240 hours)  Blood Culture (routine x 2)     Status: None   Collection Time: 12/04/23  6:45 PM   Specimen: BLOOD LEFT FOREARM  Result  Value Ref Range Status   Specimen Description BLOOD LEFT FOREARM  Final   Special Requests   Final    BOTTLES DRAWN AEROBIC AND ANAEROBIC Blood Culture adequate volume   Culture   Final    NO GROWTH 5 DAYS Performed at [facility], [address] [place], [place] [address]    Report Status 12/09/2023 FINAL  Final  Blood Culture (routine x 2)     Status: None   Collection Time: 12/04/23  8:18 PM   Specimen: BLOOD  Result Value Ref Range Status   Specimen Description BLOOD SITE NOT SPECIFIED  Final   Special Requests   Final    BOTTLES DRAWN AEROBIC ONLY Blood Culture adequate volume   Culture   Final    NO GROWTH 5 DAYS Performed at [facility], [address] [place], [place] [address]    Report Status 12/09/2023 FINAL  Final  Resp panel by RT-PCR (RSV, Flu A&B, Covid) Anterior Nasal Swab     Status: None   Collection Time: 12/04/23  9:02 PM   Specimen: Anterior Nasal Swab  Result Value Ref Range Status   SARS Coronavirus 2 by RT PCR NEGATIVE NEGATIVE Final   Influenza A by PCR NEGATIVE NEGATIVE Final   Influenza B by PCR NEGATIVE NEGATIVE Final    Comment: (NOTE) The Xpert Xpress SARS-CoV-2/FLU/RSV plus assay is intended as an aid in the diagnosis of influenza from Nasopharyngeal swab specimens and should not be used as a sole basis for treatment. Nasal washings and aspirates are unacceptable for Xpert Xpress  SARS-CoV-2/FLU/RSV testing.  Fact Sheet for Patients: bloggercourse.com  Fact Sheet for Healthcare Providers: seriousbroker.it  This test is not yet approved or cleared by the [place]  FDA and has been authorized for detection and/or diagnosis of SARS-CoV-2 by FDA under an Emergency Use Authorization (EUA). This EUA will remain in effect (meaning this test can be used) for the duration of the COVID-19 declaration under Section 564(b)(1) of the Act, 21 U.S.C. section 360bbb-3(b)(1), unless the authorization is terminated or revoked.     Resp Syncytial Virus by PCR NEGATIVE NEGATIVE Final    Comment: (NOTE) Fact Sheet for Patients: bloggercourse.com  Fact Sheet for Healthcare Providers: seriousbroker.it  This test is not yet approved or cleared by the [place]  FDA and has been authorized for detection and/or diagnosis of SARS-CoV-2 by FDA under an Emergency Use Authorization (EUA). This EUA will remain in effect (meaning this test can be used) for the duration of the COVID-19 declaration under Section 564(b)(1) of the Act, 21 U.S.C. section 360bbb-3(b)(1), unless the authorization is terminated or revoked.  Performed at [facility], [address] [place], [place] [address]   C Difficile Quick Screen w PCR reflex     Status: None   Collection Time: 12/05/23 12:17 AM   Specimen: STOOL  Result Value Ref Range Status   C Diff antigen NEGATIVE NEGATIVE Final   C Diff toxin NEGATIVE NEGATIVE Final   C Diff interpretation No C. difficile detected.  Final    Comment: Performed at [facility], [address], [place], [place] [address]  Gastrointestinal Panel by PCR , Stool     Status: None   Collection Time: 12/05/23 12:17 AM   Specimen: STOOL  Result Value Ref Range Status   Campylobacter species NOT DETECTED NOT DETECTED Final   Plesimonas shigelloides  NOT DETECTED NOT DETECTED Final   Salmonella species NOT DETECTED NOT DETECTED  Final   Yersinia enterocolitica NOT DETECTED NOT DETECTED Final   Vibrio species NOT DETECTED NOT DETECTED Final   Vibrio cholerae NOT DETECTED NOT DETECTED Final   Enteroaggregative E coli (EAEC) NOT DETECTED NOT DETECTED Final   Enteropathogenic E coli (EPEC) NOT DETECTED NOT DETECTED Final   Enterotoxigenic E coli (ETEC) NOT DETECTED NOT DETECTED Final   Shiga like toxin producing E coli (STEC) NOT DETECTED NOT DETECTED Final   Shigella/Enteroinvasive E coli (EIEC) NOT DETECTED NOT DETECTED Final   Cryptosporidium NOT DETECTED NOT DETECTED Final   Cyclospora cayetanensis NOT DETECTED NOT DETECTED Final   Entamoeba histolytica NOT DETECTED NOT DETECTED Final   Giardia lamblia NOT DETECTED NOT DETECTED Final   Adenovirus F40/41 NOT DETECTED NOT DETECTED Final   Astrovirus NOT DETECTED NOT DETECTED Final   Norovirus GI/GII NOT DETECTED NOT DETECTED Final   Rotavirus A NOT DETECTED NOT DETECTED Final   Sapovirus (I, II, IV, and V) NOT DETECTED NOT DETECTED Final    Comment: Performed at [facility], [address] [place], [place] [address]  MRSA Next Gen by PCR, Nasal     Status: None   Collection Time: 12/05/23  1:20 AM   Specimen: Nasal Mucosa; Nasal Swab  Result Value Ref Range Status   MRSA by PCR Next Gen NOT DETECTED NOT DETECTED Final    Comment: (NOTE) The GeneXpert MRSA Assay (FDA approved for NASAL specimens only), is one component of a comprehensive MRSA colonization surveillance program. It is not intended to diagnose MRSA infection nor to guide or monitor treatment for MRSA infections. Test performance is not FDA approved in patients less than 52 years old. Performed at [facility], [address], [place], [place] [address]      Labs: BNP (last 3 results) Recent Labs    11/16/23 0449 11/17/23 0516 12/04/23 1840  BNP 497.7* 366.5* 1,587.8*   Basic Metabolic  Panel: Recent Labs  Lab 12/04/23 1840 12/04/23 1844 12/04/23 1847 12/05/23 0122 12/06/23 0208 12/07/23 0322 12/09/23 0938  NA 139   < > 137 140 135 136 134*  K 3.3*   < > 3.5 3.4* 3.6 3.7 3.5  CL 104  --   --  102 96* 95* 92*  CO2 17*  --   --  23 27 27 29   GLUCOSE 225*  --   --  139* 104* 83 102*  BUN 11  --   --  14 15 19 14   CREATININE 0.92  --   --  0.82 0.89 0.74 0.64  CALCIUM  8.5*  --   --  8.1* 8.3* 8.6* 8.3*  MG  --   --   --  2.4  --   --   --   PHOS  --   --   --  5.1*  --   --   --    < > = values in this interval not displayed.   Liver Function Tests: Recent Labs  Lab 12/06/23 0208  AST 28  ALT 18  ALKPHOS 32*  BILITOT 1.2  PROT 6.1*  ALBUMIN 2.8*   No results for input(s): LIPASE, AMYLASE in the last 168 hours. No results for input(s): AMMONIA in the last 168 hours. CBC: Recent Labs  Lab 12/04/23 1840 12/04/23 1844 12/05/23 0122 12/06/23 0208 12/07/23 0322 12/08/23 0342 12/09/23 0938  WBC 8.1  --  6.1 10.5 11.9* 12.0* 10.8*  NEUTROABS 4.8  --   --   --   --   --   --  HGB 13.2   < > 12.6 11.5* 12.3 12.4 13.9  HCT 41.9   < > 38.5 33.4* 35.7* 36.7 40.6  MCV 101.7*  --  97.7 93.0 93.5 95.3 92.9  PLT 353  --  328 244 260 254 296   < > = values in this interval not displayed.   Cardiac Enzymes: No results for input(s): CKTOTAL, CKMB, CKMBINDEX, TROPONINI in the last 168 hours. BNP: Invalid input(s): POCBNP CBG: No results for input(s): GLUCAP in the last 168 hours. D-Dimer No results for input(s): DDIMER in the last 72 hours. Hgb A1c No results for input(s): HGBA1C in the last 72 hours. Lipid Profile No results for input(s): CHOL, HDL, LDLCALC, TRIG, CHOLHDL, LDLDIRECT in the last 72 hours. Thyroid function studies No results for input(s): TSH, T4TOTAL, T3FREE, THYROIDAB in the last 72 hours.  Invalid input(s): FREET3 Anemia work up No results for input(s): VITAMINB12, FOLATE, FERRITIN,  TIBC, IRON, RETICCTPCT in the last 72 hours. Urinalysis    Component Value Date/Time   COLORURINE YELLOW 12/04/2023 2211   APPEARANCEUR CLEAR 12/04/2023 2211   LABSPEC 1.011 12/04/2023 2211   LABSPEC 1.005 11/20/2007 1442   PHURINE 5.0 12/04/2023 2211   GLUCOSEU NEGATIVE 12/04/2023 2211   HGBUR NEGATIVE 12/04/2023 2211   BILIRUBINUR NEGATIVE 12/04/2023 2211   BILIRUBINUR Negative 11/20/2007 1442   KETONESUR NEGATIVE 12/04/2023 2211   PROTEINUR NEGATIVE 12/04/2023 2211   NITRITE NEGATIVE 12/04/2023 2211   LEUKOCYTESUR NEGATIVE 12/04/2023 2211   LEUKOCYTESUR Trace 11/20/2007 1442   Sepsis Labs Recent Labs  Lab 12/06/23 0208 12/07/23 0322 12/08/23 0342 12/09/23 0938  WBC 10.5 11.9* 12.0* 10.8*   Microbiology Recent Results (from the past 240 hours)  Blood Culture (routine x 2)     Status: None   Collection Time: 12/04/23  6:45 PM   Specimen: BLOOD LEFT FOREARM  Result Value Ref Range Status   Specimen Description BLOOD LEFT FOREARM  Final   Special Requests   Final    BOTTLES DRAWN AEROBIC AND ANAEROBIC Blood Culture adequate volume   Culture   Final    NO GROWTH 5 DAYS Performed at [facility], [address], [place], [place] [address]    Report Status 12/09/2023 FINAL  Final  Blood Culture (routine x 2)     Status: None   Collection Time: 12/04/23  8:18 PM   Specimen: BLOOD  Result Value Ref Range Status   Specimen Description BLOOD SITE NOT SPECIFIED  Final   Special Requests   Final    BOTTLES DRAWN AEROBIC ONLY Blood Culture adequate volume   Culture   Final    NO GROWTH 5 DAYS Performed at [facility], [address], [place], [place] [address]    Report Status 12/09/2023 FINAL  Final  Resp panel by RT-PCR (RSV, Flu A&B, Covid) Anterior Nasal Swab     Status: None   Collection Time: 12/04/23  9:02 PM   Specimen: Anterior Nasal Swab  Result Value Ref Range Status   SARS Coronavirus 2 by RT PCR NEGATIVE NEGATIVE Final   Influenza A by  PCR NEGATIVE NEGATIVE Final   Influenza B by PCR NEGATIVE NEGATIVE Final    Comment: (NOTE) The Xpert Xpress SARS-CoV-2/FLU/RSV plus assay is intended as an aid in the diagnosis of influenza from Nasopharyngeal swab specimens and should not be used as a sole basis for treatment. Nasal washings and aspirates are unacceptable for Xpert Xpress SARS-CoV-2/FLU/RSV testing.  Fact Sheet for Patients: bloggercourse.com  Fact Sheet for Healthcare Providers: seriousbroker.it  This test is not yet approved or cleared by the [place]  FDA and has been authorized for detection and/or diagnosis of SARS-CoV-2 by FDA under an Emergency Use Authorization (EUA). This EUA will remain in effect (meaning this test can be used) for the duration of the COVID-19 declaration under Section 564(b)(1) of the Act, 21 U.S.C. section 360bbb-3(b)(1), unless the authorization is terminated or revoked.     Resp Syncytial Virus by PCR NEGATIVE NEGATIVE Final    Comment: (NOTE) Fact Sheet for Patients: bloggercourse.com  Fact Sheet for Healthcare Providers: seriousbroker.it  This test is not yet approved or cleared by the [place]  FDA and has been authorized for detection and/or diagnosis of SARS-CoV-2 by FDA under an Emergency Use Authorization (EUA). This EUA will remain in effect (meaning this test can be used) for the duration of the COVID-19 declaration under Section 564(b)(1) of the Act, 21 U.S.C. section 360bbb-3(b)(1), unless the authorization is terminated or revoked.  Performed at [facility], [address] [place], [place] [address]   C Difficile Quick Screen w PCR reflex     Status: None   Collection Time: 12/05/23 12:17 AM   Specimen: STOOL  Result Value Ref Range Status   C Diff antigen NEGATIVE NEGATIVE Final   C Diff toxin NEGATIVE NEGATIVE Final   C Diff interpretation No  C. difficile detected.  Final    Comment: Performed at [facility], [address] [place], [place] [address]  Gastrointestinal Panel by PCR , Stool     Status: None   Collection Time: 12/05/23 12:17 AM   Specimen: STOOL  Result Value Ref Range Status   Campylobacter species NOT DETECTED NOT DETECTED Final   Plesimonas shigelloides NOT DETECTED NOT DETECTED Final   Salmonella species NOT DETECTED NOT DETECTED Final   Yersinia enterocolitica NOT DETECTED NOT DETECTED Final   Vibrio species NOT DETECTED NOT DETECTED Final   Vibrio cholerae NOT DETECTED NOT DETECTED Final   Enteroaggregative E coli (EAEC) NOT DETECTED NOT DETECTED Final   Enteropathogenic E coli (EPEC) NOT DETECTED NOT DETECTED Final   Enterotoxigenic E coli (ETEC) NOT DETECTED NOT DETECTED Final   Shiga like toxin producing E coli (STEC) NOT DETECTED NOT DETECTED Final   Shigella/Enteroinvasive E coli (EIEC) NOT DETECTED NOT DETECTED Final   Cryptosporidium NOT DETECTED NOT DETECTED Final   Cyclospora cayetanensis NOT DETECTED NOT DETECTED Final   Entamoeba histolytica NOT DETECTED NOT DETECTED Final   Giardia lamblia NOT DETECTED NOT DETECTED Final   Adenovirus F40/41 NOT DETECTED NOT DETECTED Final   Astrovirus NOT DETECTED NOT DETECTED Final   Norovirus GI/GII NOT DETECTED NOT DETECTED Final   Rotavirus A NOT DETECTED NOT DETECTED Final   Sapovirus (I, II, IV, and V) NOT DETECTED NOT DETECTED Final    Comment: Performed at [facility], [address] [place], [place] [address]  MRSA Next Gen by PCR, Nasal     Status: None   Collection Time: 12/05/23  1:20 AM   Specimen: Nasal Mucosa; Nasal Swab  Result Value Ref Range Status   MRSA by PCR Next Gen NOT DETECTED NOT DETECTED Final    Comment: (NOTE) The GeneXpert MRSA Assay (FDA approved for NASAL specimens only), is one component of a comprehensive MRSA colonization surveillance program. It is not intended to diagnose MRSA infection nor to  guide or monitor treatment for MRSA infections. Test performance is not FDA approved in patients  less than 45 years old. Performed at [facility], [address], [place], [place] [address]     FURTHER DISCHARGE INSTRUCTIONS:   Get Medicines reviewed and adjusted: Please take all your medications with you for your next visit with your Primary MD   Laboratory/radiological data: Please request your Primary MD to go over all hospital tests and procedure/radiological results at the follow up, please ask your Primary MD to get all Hospital records sent to his/her office.   In some cases, they will be blood work, cultures and biopsy results pending at the time of your discharge. Please request that your primary care M.D. goes through all the records of your hospital data and follows up on these results.   Also Note the following: If you experience worsening of your admission symptoms, develop shortness of breath, life threatening emergency, suicidal or homicidal thoughts you must seek medical attention immediately by calling 911 or calling your MD immediately  if symptoms less severe.   You must read complete instructions/literature along with all the possible adverse reactions/side effects for all the Medicines you take and that have been prescribed to you. Take any new Medicines after you have completely understood and accpet all the possible adverse reactions/side effects.    patient was instructed, not to drive, operate heavy machinery, perform activities at heights, swimming or participation in water  activities or provide baby-sitting services while on Pain, Sleep and Anxiety Medications; until their outpatient Physician has advised to do so again. Also recommended to not to take more than prescribed Pain, Sleep and Anxiety Medications.  It is not advisable to combine anxiety, sleep and pain medications without talking with your primary care provider.     Wear Seat belts while  driving.   Please note: You were cared for by a hospitalist during your hospital stay. Once you are discharged, your primary care physician will handle any further medical issues. Please note that NO REFILLS for any discharge medications will be authorized once you are discharged, as it is imperative that you return to your primary care physician (or establish a relationship with a primary care physician if you do not have one) for your post hospital discharge needs so that they can reassess your need for medications and monitor your lab values  Time coordinating discharge: Over 30 minutes  SIGNED:   Fredia Skeeter, MD  [facility] 12/09/2023, 11:31 AM *Please note that this is a verbal dictation therefore any spelling or grammatical errors are due to the Dragon Medical One system interpretation. If 7PM-7AM, please contact night-coverage [email or web address]

## 2023-12-09 NOTE — Progress Notes (Signed)
 Report called to [facility] LPN at [facility].

## 2023-12-10 ENCOUNTER — Other Ambulatory Visit (HOSPITAL_COMMUNITY): Payer: Self-pay

## 2023-12-10 LAB — LIPOPROTEIN A (LPA): Lipoprotein (a): 53.8 nmol/L — ABNORMAL HIGH (ref <75.0)

## 2023-12-25 NOTE — Progress Notes (Deleted)
 Cardiology Office Note    Date:  12/25/2023  ID:  [name] Wendy, DOB [birth year], MRN [id number] PCP:  System, Provider Not In  Cardiologist:  Lonni Cash, MD  Electrophysiologist:  None   Chief Complaint: ***  History of Present Illness: .    Wendy [name] is a 68 y.o. female with visit-pertinent history of CAD s/p NSTEMI, HFmrEF, hypertension, hyperlipidemia, rectal cancer s/p resection, adjuvant treatment, external radiation, DVT, tobacco use, HCV, COPD.  Patient seen while inpatient on 11/14/2023 by Dr. Cash for elevated troponin/CHF.  Patient with longstanding history of tobacco abuse but no prior formal testing for COPD.  Patient reported she had gone outside to smoke marijuana around 1 AM and then return to the house and had sudden onset of shortness of breath and EMS was called.  She also reported heaviness in her chest but no overt chest pain.  On arrival she was 83% room air and tachycardic in the 130s, placed on BiPAP.  In the ER she was hypertensive at 195/94, tachycardic and tachypneic.  Chest x-ray showed interval development of diffuse mild interstitial thickening/possible mild pulmonary edema, underlying COPD.  BNP 1097, hemoglobin 15.1, A1c 5.0, high sensitive troponin 174->411->499->495. 2D echo showed EF 45% with akinesis of the apical lateral segment, mid inferoseptal segment, apical septal segment, and apex, moderate AI.  CTA chest was negative for PE.  At that time medication management was recommended.  Patient was discharged in stable condition.  On 12/05/2023 patient return to the emergency department, presented with acute respiratory failure in setting of possible COPD exacerbation with continued tobacco abuse, found to have elevated hsTn [id number], EKG w/diffuse anterolateral ST depression and TWI with repeat showing TWI in v5-v6.  Echo on 12/05/2023 indicated LVEF 55%, small area of inferior basal akinesis, wall motion abnormalities were noted, diastolic  parameters were normal, RV systolic function size was normal, LA was moderately dilated, mild mitral valve regurgitation, no evidence of stenosis, mild calcification of the aortic valve, aortic valve regurgitation was moderate to severe.  Patient underwent cardiac catheterization on 11/24 with mild to moderate CAD of 40% OM1, 65% RPA V, 15% ostial circumflex.  Recommendations for medical therapy as RPA V lesion too small for PCI.  Was felt that elevated troponin was secondary to demand ischemia.  Patient was discharged to skilled nursing facility on 12/09/2023.  Today she presents for follow-up.  She reports that she   CAD: s/p NSTEMI vs demand ischemia. Cardiac catheterization on 11/24 with mild to moderate CAD of 40% OM1, 65% RPA V, 15% ostial circumflex.  Recommendations for medical therapy as RPA V lesion too small for PCI.  Was felt that elevated troponin was secondary to demand ischemia. Today she reports  Cath site  Reviewed ED precautions  Continue   HFmrEF: Echo on 11/14/23 showed EF 45% with akinesis of the apical lateral segment, mid inferoseptal segment, apical septal segment, and apex, moderate AI.  Repeat echo on 12/05/2023 indicated LVEF 55%, small area of inferior basal akinesis, wall motion abnormalities were noted, diastolic parameters were normal, RV systolic function size was normal, LA was moderately dilated, mild mitral valve regurgitation, no evidence of stenosis, mild calcification of the aortic valve, aortic valve regurgitation was moderate to severe.  Patient underwent further IV diuresis. Today she reports   COPD:    HLD: Last lipid profile indicated   Labwork independently reviewed:   ROS: .   *** denies chest pain, shortness of breath, lower extremity edema, fatigue, palpitations,  melena, hematuria, hemoptysis, diaphoresis, weakness, presyncope, syncope, orthopnea, and PND.  All other systems are reviewed and otherwise negative.  Studies Reviewed: [name]    EKG:   EKG is ordered today, personally reviewed, demonstrating ***     CV Studies: Cardiac studies reviewed are outlined and summarized above. Otherwise please see EMR for full report. Cardiac Studies & Procedures   ______________________________________________________________________________________________ CARDIAC CATHETERIZATION  CARDIAC CATHETERIZATION 12/08/2023  Conclusion   1st Mrg lesion is 40% stenosed.   Ost Cx to Prox Cx lesion is 15% stenosed.   Prox RCA lesion is 20% stenosed.   RPAV lesion is 65% stenosed.   LV end diastolic pressure is normal.   There is no aortic valve stenosis.   In the absence of any other complications or medical issues, we expect the patient to be ready for discharge from a cath perspective on 12/09/2023.   Recommend Aspirin  81mg  daily for moderate CAD.  Conclusions: Mild to moderate coronary artery disease, as detailed below.  Most severe lesion is a 60-70% stenosis in distal rPAV, which is too small for PCI.  Suspect elevated troponin is due to supply-demand mismatch. Normal left ventricular filling pressure (LVEDP 8 mmHg).  Recommendations: Continue medical therapy and risk factor modification to prevent progression of coronary artery disease. Maintain net even fluid balance.  [name], MD [facility]  Findings Coronary Findings Diagnostic  Dominance: Right  Left Main Vessel is large. Vessel is angiographically normal.  Left Anterior Descending Vessel is large. Vessel is angiographically normal.  First Diagonal Branch Vessel is moderate in size.  Left Circumflex Vessel is large. Ost Cx to Prox Cx lesion is 15% stenosed.  First Obtuse Marginal Branch Vessel is small in size. 1st Mrg lesion is 40% stenosed.  Second Obtuse Marginal Branch Vessel is small in size.  Third Obtuse Marginal Branch Vessel is moderate in size.  Right Coronary Artery Vessel is moderate in size. Prox RCA lesion is 20% stenosed. The lesion is  focal and eccentric. The lesion is calcified.  Right Posterior Descending Artery Vessel is moderate in size.  Right Posterior Atrioventricular Artery Vessel is moderate in size. RPAV lesion is 65% stenosed.  First Right Posterolateral Branch Vessel is small in size.  Second Right Posterolateral Branch Vessel is small in size.  Third Right Posterolateral Branch Vessel is small in size.  Intervention  No interventions have been documented.     ECHOCARDIOGRAM  ECHOCARDIOGRAM COMPLETE 12/05/2023  Narrative ECHOCARDIOGRAM REPORT    Patient Name:   HARJOT ZAVADIL Date of Exam: 12/05/2023 Medical Rec #:  [id number]       Height:       62.0 in Accession #:    [id number]      Weight:       94.0 lb Date of Birth:  1955/05/14       BSA:          1.387 m Patient Age:    68 years        BP:           128/67 mmHg Patient Gender: F               HR:           70 bpm. Exam Location:  Inpatient  Procedure: 2D Echo (Both Spectral and Color Flow Doppler were utilized during procedure).  Indications:    Elevated troponin  History:        Patient has prior history of Echocardiogram examinations.  Sonographer:  Charmaine Gaskins Referring Phys: [id number] DEWARD ORN HOFFMAN  IMPRESSIONS   1. Small area of inferior basal akinesis . Left ventricular ejection fraction, by estimation, is 55%. The left ventricle has normal function. The left ventricle demonstrates regional wall motion abnormalities (see scoring diagram/findings for description). Left ventricular diastolic parameters were normal. 2. Right ventricular systolic function is normal. The right ventricular size is normal. 3. Left atrial size was moderately dilated. 4. The mitral valve is abnormal. Mild mitral valve regurgitation. No evidence of mitral stenosis. 5. The aortic valve is tricuspid. There is mild calcification of the aortic valve. There is mild thickening of the aortic valve. Aortic valve regurgitation is moderate to  severe. Aortic valve sclerosis is present, with no evidence of aortic valve stenosis. 6. The inferior vena cava is dilated in size with >50% respiratory variability, suggesting right atrial pressure of 8 mmHg.  FINDINGS Left Ventricle: Small area of inferior basal akinesis. Left ventricular ejection fraction, by estimation, is 55%. The left ventricle has normal function. The left ventricle demonstrates regional wall motion abnormalities. Strain was performed and the global longitudinal strain is indeterminate. The left ventricular internal cavity size was normal in size. There is no left ventricular hypertrophy. Left ventricular diastolic parameters were normal.  Right Ventricle: The right ventricular size is normal. No increase in right ventricular wall thickness. Right ventricular systolic function is normal.  Left Atrium: Left atrial size was moderately dilated.  Right Atrium: Right atrial size was normal in size.  Pericardium: There is no evidence of pericardial effusion.  Mitral Valve: The mitral valve is abnormal. There is mild thickening of the mitral valve leaflet(s). There is mild calcification of the mitral valve leaflet(s). Mild mitral valve regurgitation. No evidence of mitral valve stenosis.  Tricuspid Valve: The tricuspid valve is normal in structure. Tricuspid valve regurgitation is mild . No evidence of tricuspid stenosis.  Aortic Valve: The aortic valve is tricuspid. There is mild calcification of the aortic valve. There is mild thickening of the aortic valve. Aortic valve regurgitation is moderate to severe. Aortic valve sclerosis is present, with no evidence of aortic valve stenosis. Aortic valve mean gradient measures 6.0 mmHg. Aortic valve peak gradient measures 13.5 mmHg. Aortic valve area, by VTI measures 1.98 cm.  Pulmonic Valve: The pulmonic valve was normal in structure. Pulmonic valve regurgitation is not visualized. No evidence of pulmonic stenosis.  Aorta: The  aortic root is normal in size and structure.  Venous: The inferior vena cava is dilated in size with greater than 50% respiratory variability, suggesting right atrial pressure of 8 mmHg.  IAS/Shunts: No atrial level shunt detected by color flow Doppler.  Additional Comments: 3D was performed not requiring image post processing on an independent workstation and was indeterminate.   LEFT VENTRICLE PLAX 2D LVIDd:         4.70 cm   Diastology LVIDs:         3.30 cm   LV e' medial:    5.98 cm/s LV PW:         1.20 cm   LV E/e' medial:  9.4 LV IVS:        1.00 cm   LV e' lateral:   5.77 cm/s LVOT diam:     1.95 cm   LV E/e' lateral: 9.7 LV SV:         62 LV SV Index:   45 LVOT Area:     2.99 cm   RIGHT VENTRICLE RV Basal diam:  2.80 cm  RV Mid diam:    2.40 cm RV S prime:     15.10 cm/s  LEFT ATRIUM             Index        RIGHT ATRIUM           Index LA diam:        3.40 cm 2.45 cm/m   RA Area:     10.80 cm LA Vol (A2C):   65.7 ml 47.37 ml/m  RA Volume:   19.60 ml  14.13 ml/m LA Vol (A4C):   84.8 ml 61.14 ml/m LA Biplane Vol: 79.7 ml 57.46 ml/m AORTIC VALVE AV Area (Vmax):    2.09 cm AV Area (Vmean):   1.95 cm AV Area (VTI):     1.98 cm AV Vmax:           184.00 cm/s AV Vmean:          105.000 cm/s AV VTI:            0.316 m AV Peak Grad:      13.5 mmHg AV Mean Grad:      6.0 mmHg LVOT Vmax:         129.00 cm/s LVOT Vmean:        68.500 cm/s LVOT VTI:          0.209 m LVOT/AV VTI ratio: 0.66  AORTA Ao Root diam: 2.90 cm Ao Asc diam:  3.40 cm  MITRAL VALVE MV Area (PHT): 3.54 cm    SHUNTS MV Decel Time: 214 msec    Systemic VTI:  0.21 m MV E velocity: 56.10 cm/s  Systemic Diam: 1.95 cm MV A velocity: 69.70 cm/s MV E/A ratio:  0.80  Maude Emmer MD Electronically signed by Maude Emmer MD Signature Date/Time: 12/05/2023/11:51:54 AM    Final          ______________________________________________________________________________________________        Current Reported Medications:.    Active Medications[1]  Physical Exam:    VS:  LMP 01/03/1995    Wt Readings from Last 3 Encounters:  12/09/23 81 lb 9.6 oz (37 kg)  11/14/23 94 lb (42.6 kg)  06/23/14 104 lb 3.2 oz (47.3 kg)    GEN: Well nourished, well developed in no acute distress NECK: No JVD; No carotid bruits CARDIAC: ***RRR, no murmurs, rubs, gallops RESPIRATORY:  Clear to auscultation without rales, wheezing or rhonchi  ABDOMEN: Soft, non-tender, non-distended EXTREMITIES:  No edema; No acute deformity     Asessement and Plan:.     ***     Disposition: F/u with ***  Signed, Beata Beason D Chiyo Fay, NP       [1]  No outpatient medications have been marked as taking for the 12/29/23 encounter (Appointment) with Leiah Giannotti D, NP.

## 2023-12-29 ENCOUNTER — Ambulatory Visit: Attending: Cardiology | Admitting: Cardiology

## 2023-12-29 ENCOUNTER — Other Ambulatory Visit (HOSPITAL_COMMUNITY): Payer: Self-pay

## 2023-12-29 DIAGNOSIS — J449 Chronic obstructive pulmonary disease, unspecified: Secondary | ICD-10-CM

## 2023-12-29 DIAGNOSIS — E782 Mixed hyperlipidemia: Secondary | ICD-10-CM

## 2023-12-29 DIAGNOSIS — I2510 Atherosclerotic heart disease of native coronary artery without angina pectoris: Secondary | ICD-10-CM

## 2023-12-29 DIAGNOSIS — I5020 Unspecified systolic (congestive) heart failure: Secondary | ICD-10-CM

## 2023-12-29 MED ORDER — PANTOPRAZOLE SODIUM 40 MG PO TBEC
40.0000 mg | DELAYED_RELEASE_TABLET | Freq: Every day | ORAL | 0 refills | Status: AC
  Filled 2023-12-29: qty 30, 30d supply, fill #0

## 2023-12-29 MED ORDER — FUROSEMIDE 20 MG PO TABS
20.0000 mg | ORAL_TABLET | Freq: Every day | ORAL | 0 refills | Status: AC
  Filled 2023-12-29: qty 30, 30d supply, fill #0

## 2024-01-22 ENCOUNTER — Emergency Department (HOSPITAL_COMMUNITY)

## 2024-01-22 ENCOUNTER — Other Ambulatory Visit: Payer: Self-pay

## 2024-01-22 ENCOUNTER — Emergency Department (HOSPITAL_COMMUNITY)
Admission: EM | Admit: 2024-01-22 | Discharge: 2024-01-22 | Disposition: A | Discharge status: Home / Self Care | Attending: Emergency Medicine | Admitting: Emergency Medicine

## 2024-01-22 DIAGNOSIS — M25551 Pain in right hip: Secondary | ICD-10-CM | POA: Diagnosis not present

## 2024-01-22 DIAGNOSIS — S42292A Other displaced fracture of upper end of left humerus, initial encounter for closed fracture: Secondary | ICD-10-CM | POA: Insufficient documentation

## 2024-01-22 DIAGNOSIS — Z7982 Long term (current) use of aspirin: Secondary | ICD-10-CM | POA: Insufficient documentation

## 2024-01-22 DIAGNOSIS — M25512 Pain in left shoulder: Secondary | ICD-10-CM | POA: Diagnosis present

## 2024-01-22 DIAGNOSIS — M25552 Pain in left hip: Secondary | ICD-10-CM | POA: Diagnosis not present

## 2024-01-22 DIAGNOSIS — W19XXXA Unspecified fall, initial encounter: Secondary | ICD-10-CM

## 2024-01-22 DIAGNOSIS — Z79899 Other long term (current) drug therapy: Secondary | ICD-10-CM | POA: Diagnosis not present

## 2024-01-22 DIAGNOSIS — I10 Essential (primary) hypertension: Secondary | ICD-10-CM | POA: Insufficient documentation

## 2024-01-22 DIAGNOSIS — S0990XA Unspecified injury of head, initial encounter: Secondary | ICD-10-CM | POA: Insufficient documentation

## 2024-01-22 DIAGNOSIS — W010XXA Fall on same level from slipping, tripping and stumbling without subsequent striking against object, initial encounter: Secondary | ICD-10-CM | POA: Diagnosis not present

## 2024-01-22 LAB — BASIC METABOLIC PANEL WITH GFR
Anion gap: 13 (ref 5–15)
BUN: 7 mg/dL — ABNORMAL LOW (ref 8–23)
CO2: 22 mmol/L (ref 22–32)
Calcium: 8.8 mg/dL — ABNORMAL LOW (ref 8.9–10.3)
Chloride: 107 mmol/L (ref 98–111)
Creatinine, Ser: 0.58 mg/dL (ref 0.44–1.00)
GFR, Estimated: >60 mL/min
Glucose, Bld: 106 mg/dL — ABNORMAL HIGH (ref 70–99)
Potassium: 3.3 mmol/L — ABNORMAL LOW (ref 3.5–5.1)
Sodium: 142 mmol/L (ref 135–145)

## 2024-01-22 LAB — CBC
HCT: 35.4 % — ABNORMAL LOW (ref 36.0–46.0)
Hemoglobin: 11.2 g/dL — ABNORMAL LOW (ref 12.0–15.0)
MCH: 32.6 pg (ref 26.0–34.0)
MCHC: 31.6 g/dL (ref 30.0–36.0)
MCV: 102.9 fL — ABNORMAL HIGH (ref 80.0–100.0)
Platelets: 271 K/uL (ref 150–400)
RBC: 3.44 MIL/uL — ABNORMAL LOW (ref 3.87–5.11)
RDW: 15.8 % — ABNORMAL HIGH (ref 11.5–15.5)
WBC: 7.5 K/uL (ref 4.0–10.5)
nRBC: 0 % (ref 0.0–0.2)

## 2024-01-22 MED ORDER — HYDROMORPHONE HCL 1 MG/ML IJ SOLN
0.5000 mg | Freq: Once | INTRAMUSCULAR | Status: AC
  Administered 2024-01-22: 0.5 mg via INTRAVENOUS
  Filled 2024-01-22: qty 1

## 2024-01-22 MED ORDER — OXYCODONE HCL 5 MG PO TABS: 5.0000 mg | ORAL_TABLET | Freq: Four times a day (QID) | ORAL | 0 refills | Status: DC | PRN

## 2024-01-22 MED ORDER — ACETAMINOPHEN 500 MG PO TABS
1000.0000 mg | ORAL_TABLET | Freq: Once | ORAL | Status: AC
  Administered 2024-01-22: 1000 mg via ORAL
  Filled 2024-01-22: qty 2

## 2024-01-22 MED ORDER — FENTANYL CITRATE (PF) 50 MCG/ML IJ SOSY
50.0000 ug | PREFILLED_SYRINGE | Freq: Once | INTRAMUSCULAR | Status: AC
  Administered 2024-01-22: 50 ug via INTRAMUSCULAR
  Filled 2024-01-22: qty 1

## 2024-01-22 MED ORDER — IBUPROFEN 400 MG PO TABS
600.0000 mg | ORAL_TABLET | Freq: Once | ORAL | Status: AC
  Administered 2024-01-22: 600 mg via ORAL
  Filled 2024-01-22: qty 1

## 2024-01-22 MED ORDER — OXYCODONE HCL 5 MG PO TABS
2.5000 mg | ORAL_TABLET | Freq: Once | ORAL | Status: AC
  Administered 2024-01-22: 2.5 mg via ORAL
  Filled 2024-01-22: qty 1

## 2024-01-22 MED ORDER — TIZANIDINE HCL 4 MG PO TABS: 4.0000 mg | ORAL_TABLET | Freq: Three times a day (TID) | ORAL | 0 refills | Status: DC | PRN

## 2024-01-22 MED ORDER — OXYCODONE HCL 5 MG PO TABS
5.0000 mg | ORAL_TABLET | Freq: Once | ORAL | Status: AC
  Administered 2024-01-22: 5 mg via ORAL
  Filled 2024-01-22: qty 1

## 2024-01-22 MED ORDER — OXYCODONE HCL 5 MG PO TABS: 5.0000 mg | ORAL_TABLET | ORAL | Status: DC | PRN

## 2024-01-22 MED ORDER — POTASSIUM CHLORIDE CRYS ER 20 MEQ PO TBCR
40.0000 meq | EXTENDED_RELEASE_TABLET | Freq: Once | ORAL | Status: AC
  Administered 2024-01-22: 40 meq via ORAL
  Filled 2024-01-22: qty 2

## 2024-01-22 MED ORDER — OXYCODONE-ACETAMINOPHEN 5-325 MG PO TABS
1.0000 | ORAL_TABLET | Freq: Once | ORAL | Status: AC
  Administered 2024-01-22: 1 via ORAL
  Filled 2024-01-22: qty 1

## 2024-01-22 NOTE — ED Provider Notes (Signed)
 Care assumed from PA Medford Lites at shift change, please see his note for full details but in brief, Wendy [name] is a 69 y.o. female presents after a mechanical fall landing on her left shoulder also complaining of pain in the left elbow, pelvis, and did hit her head but without loss of consciousness.  Imaging of the left shoulder, elbow, pelvis, head and cervical spine pending as well as basic lab work at shift change.  Based on provider review of x-rays completed concern for proximal left humerus fracture but awaiting formal radiology read.  Plan: Pain control following up on pending imaging and laboratory evaluation and orthopedic consult  BP (!) 157/107   Pulse (!) 58   Temp 97.6 F (36.4 C) (Oral)   Resp (!) 21   LMP 01/03/1995   SpO2 98%    Procedures  Procedures  ED Course / MDM   Labs Reviewed  CBC - Abnormal; Notable for the following components:      Result Value   RBC 3.44 (*)    Hemoglobin 11.2 (*)    HCT 35.4 (*)    MCV 102.9 (*)    RDW 15.8 (*)    All other components within normal limits  BASIC METABOLIC PANEL WITH GFR - Abnormal; Notable for the following components:   Potassium 3.3 (*)    Glucose, Bld 106 (*)    BUN 7 (*)    Calcium  8.8 (*)    All other components within normal limits   DG Elbow Complete Left Result Date: 01/22/2024 CLINICAL DATA:  Fall.  Pain. EXAM: LEFT ELBOW - COMPLETE 3+ VIEW COMPARISON:  None Available. FINDINGS: Exam is markedly limited by positioning. Within this limitation, no gross fracture or dislocation is evident. Assessment for joint effusion is not possible due to the inability to obtain a true 90 degree lateral projection. IMPRESSION: Markedly limited study due to positioning. Within this limitation, no gross fracture or dislocation is evident. Electronically Signed   By: Camellia Candle M.D.   On: 01/22/2024 06:55   CT Head Wo Contrast Result Date: 01/22/2024 EXAM: CT HEAD WITHOUT CONTRAST 01/22/2024 06:18:43 AM TECHNIQUE: CT  of the head was performed without the administration of intravenous contrast. Automated exposure control, iterative reconstruction, and/or weight based adjustment of the mA/kV was utilized to reduce the radiation dose to as low as reasonably achievable. COMPARISON: Cervical spine CT and head CT, both 10/04/2012. CLINICAL HISTORY: Head trauma, moderate-severe. Fall injury with head and neck trauma. FINDINGS: BRAIN AND VENTRICLES: No acute hemorrhage. No evidence of acute infarct. No extra-axial collection. No mass effect or midline shift. There is mild to moderate cerebral atrophy with moderate small vessel disease of the cerebral white matter with atrophic ventriculomegaly and mild cerebellar volume loss. Findings are progressed from 2014. ORBITS: No acute abnormality. SINUSES: The sinuses are clear. The nasal septum is s-shaped. SOFT TISSUES AND SKULL: No acute soft tissue abnormality. No skull fracture. There is patchy fluid in the right mastoid tip. The other bilateral mastoid air cells are clear. CERVICAL SPINE: The cervical spine demonstrates mild osteopenia without evidence of fractures or focal pathologic bone lesions. Similar to 2014 there is a grade 1 degenerative anterolisthesis at C3-C4 with left facet joint ankylosis, and minimal discogenic retrolisthesis at C4-C5, C5-C6, and C6-C7. There is no traumatic listhesis or new abnormal alignment. Narrowing and osteophytes are again noted of the anterior atlantodental joint. There is progressive degenerative disc collapse from C3-C4 through C6-C7 with increasingly prominent bidirectional endplate osteophytes.  Normal disc heights at C2-C3 and C7-T1. Multilevel moderate to advanced facet hypertrophy, left greater than right, with uncinate joint spurring. Acquired foraminal stenosis is mild to moderate on the left at C2-C3, severe on the left at C3-C4, bilaterally severe at C4-C5, severe on the right greater than left at C5-C6, and severe on the right at C6-C7. At  C2-C3 there is a small disc protrusion without mass effect. At C5-C6 posterior osteophytes account for mild to moderate spinal canal stenosis with mild spondylotic cord compression. There is borderline cord compression by posterior osteophytes at C3-C4 and C4-C5. There are interval increased calcifications at both carotid bifurcations. There is no laryngeal or thyroid mass. LUNG APICES: In the lung apices, there is centrilobular emphysema and asymmetric linear scarring again on the right. No acute upper thoracic findings. IMPRESSION: 1. No acute intracranial CT findings or depressed skull fractures. 2. Small vessel changes and atrophy , progressed from 2014. 3. Patchy fluid in the right mastoid tip. 4. Osteopenia and degenerative changes cervical spine, also progressive. 5. Emphysema. The patient probably qualifies for a low-dose lung cancer screening CT program. Electronically signed by: Francis Quam MD 01/22/2024 06:52 AM EST RP Workstation: [id number]   CT Cervical Spine Wo Contrast Result Date: 01/22/2024 EXAM: CT HEAD WITHOUT CONTRAST 01/22/2024 06:18:43 AM TECHNIQUE: CT of the head was performed without the administration of intravenous contrast. Automated exposure control, iterative reconstruction, and/or weight based adjustment of the mA/kV was utilized to reduce the radiation dose to as low as reasonably achievable. COMPARISON: Cervical spine CT and head CT, both 10/04/2012. CLINICAL HISTORY: Head trauma, moderate-severe. Fall injury with head and neck trauma. FINDINGS: BRAIN AND VENTRICLES: No acute hemorrhage. No evidence of acute infarct. No extra-axial collection. No mass effect or midline shift. There is mild to moderate cerebral atrophy with moderate small vessel disease of the cerebral white matter with atrophic ventriculomegaly and mild cerebellar volume loss. Findings are progressed from 2014. ORBITS: No acute abnormality. SINUSES: The sinuses are clear. The nasal septum is s-shaped. SOFT  TISSUES AND SKULL: No acute soft tissue abnormality. No skull fracture. There is patchy fluid in the right mastoid tip. The other bilateral mastoid air cells are clear. CERVICAL SPINE: The cervical spine demonstrates mild osteopenia without evidence of fractures or focal pathologic bone lesions. Similar to 2014 there is a grade 1 degenerative anterolisthesis at C3-C4 with left facet joint ankylosis, and minimal discogenic retrolisthesis at C4-C5, C5-C6, and C6-C7. There is no traumatic listhesis or new abnormal alignment. Narrowing and osteophytes are again noted of the anterior atlantodental joint. There is progressive degenerative disc collapse from C3-C4 through C6-C7 with increasingly prominent bidirectional endplate osteophytes. Normal disc heights at C2-C3 and C7-T1. Multilevel moderate to advanced facet hypertrophy, left greater than right, with uncinate joint spurring. Acquired foraminal stenosis is mild to moderate on the left at C2-C3, severe on the left at C3-C4, bilaterally severe at C4-C5, severe on the right greater than left at C5-C6, and severe on the right at C6-C7. At C2-C3 there is a small disc protrusion without mass effect. At C5-C6 posterior osteophytes account for mild to moderate spinal canal stenosis with mild spondylotic cord compression. There is borderline cord compression by posterior osteophytes at C3-C4 and C4-C5. There are interval increased calcifications at both carotid bifurcations. There is no laryngeal or thyroid mass. LUNG APICES: In the lung apices, there is centrilobular emphysema and asymmetric linear scarring again on the right. No acute upper thoracic findings. IMPRESSION: 1. No acute intracranial CT findings  or depressed skull fractures. 2. Small vessel changes and atrophy , progressed from 2014. 3. Patchy fluid in the right mastoid tip. 4. Osteopenia and degenerative changes cervical spine, also progressive. 5. Emphysema. The patient probably qualifies for a low-dose lung  cancer screening CT program. Electronically signed by: Francis Quam MD 01/22/2024 06:52 AM EST RP Workstation: [id number]   DG Pelvis Portable Result Date: 01/22/2024 CLINICAL DATA:  Pain.  Skip down EXAM: PORTABLE PELVIS 1-2 VIEWS COMPARISON:  No comparison studies available. FINDINGS: Scratchbones are diffusely demineralized. No evidence for an acute fracture. No hip dislocation. SI joints and symphysis pubis are unremarkable. IMPRESSION: No acute bony findings. Electronically Signed   By: Camellia Candle M.D.   On: 01/22/2024 06:49   DG Shoulder Left Portable Result Date: 01/22/2024 CLINICAL DATA:  Fall.  Shoulder pain. EXAM: LEFT SHOULDER COMPARISON:  None Available. FINDINGS: Comminuted fracture of the humeral neck evident with mild angulation. Humeral head component of the fracture is located relative to the glenoid fossa. Acromioclavicular and coracoclavicular distances are preserved. IMPRESSION: Comminuted fracture of the humeral neck with mild angulation. Electronically Signed   By: Camellia Candle M.D.   On: 01/22/2024 06:48    Medical Decision Making Amount and/or Complexity of Data Reviewed Labs: ordered. Radiology: ordered.  Risk Prescription drug management.   Labs overall unremarkable mild hypokalemia with potassium of 3.3, p.o. potassium replacement provided.  I have independently viewed and interpreted all imaging, there is a proximal left humerus fracture with displacement, no elbow fracture or dislocation noted and no pelvic or hip fracture noted.  CT scans of the head and cervical spine without intracranial bleeding, or fracture.  Will consult orthopedics regarding proximal humerus fracture, patient reports minimal pain relief with provided p.o. oxycodone  and IM fentanyl , given additional dose of pain medication and will reassess.  Fortunately patient is right-hand-dominant, lives at home with her significant other.  Case discussed with PA Toni Purchase with ortho recommends  sling and non-weight bearing on left arm with pain control and ortho follow up with Dr. Teresa in 2-3 weeks. Discussed disposition with patient, stable for DC home.    Alva Larraine FALCON, PA-C 01/22/24 [id number]    Rogelia Jerilynn RAMAN, MD 01/22/24 1031

## 2024-01-22 NOTE — ED Triage Notes (Signed)
 Pt complaints of shoulder pain, post mechanical fall, left side.

## 2024-01-22 NOTE — ED Provider Notes (Signed)
 " [facility] AT [facility] Provider Note   CSN: [id number] Arrival date & time: 01/22/24  9474     Patient presents with: No chief complaint on file.   Wendy [name] is a 69 y.o. female with history of alcohol dependency, history of DVT, anxiety, hepatitis C, HSV 1, HSV-2, hypertension, acute on chronic respiratory failure.  Presents to ED due to fall.  States that she tripped and fell this morning getting out of bed, fell onto her left shoulder.  She arrives in a tremendous Mehta pain in her left shoulder.  She is also complaining of pain in her bilateral hips.  She denies LOC but does report she hit her head.  Denies blood thinners.  Denies preceding chest pain or shortness of breath prior to fall.  Denies nausea vomiting or abdominal pain.  HPI     Prior to Admission medications  Medication Sig Start Date End Date Taking? Authorizing Provider  acetaminophen  (TYLENOL ) 325 MG tablet Take 650 mg by mouth every 6 (six) hours as needed for mild pain (pain score 1-3) or headache. Patient not taking: Reported on 12/05/2023    [provider]  albuterol  (VENTOLIN  HFA) 108 (90 Base) MCG/ACT inhaler Inhale 2 puffs into the lungs every 6 (six) hours as needed for wheezing or shortness of breath. 11/17/23   Singh, Prashant K, MD  aspirin  EC 81 MG tablet Take 1 tablet (81 mg total) by mouth daily. Swallow whole. 11/17/23   Singh, Prashant K, MD  furosemide  (LASIX ) 20 MG tablet Take 1 tablet (20 mg total) by mouth daily. 12/29/23     furosemide  (LASIX ) 40 MG tablet Take 1 tablet (40 mg total) by mouth daily. 12/10/23   Vernon Ranks, MD  lisinopril  (ZESTRIL ) 10 MG tablet Take 1 tablet (10 mg total) by mouth daily. 12/10/23 01/09/24  Vernon Ranks, MD  pantoprazole  (PROTONIX ) 40 MG tablet Take 1 tablet (40 mg total) by mouth daily. 12/29/23     rosuvastatin  (CRESTOR ) 20 MG tablet Take 1 tablet (20 mg total) by mouth daily. 12/10/23   Vernon Ranks, MD     Allergies: Amoxicillin, Vancomycin, and Erythromycin base    Review of Systems  All other systems reviewed and are negative.   Updated Vital Signs BP (!) 157/107   Pulse (!) 58   Temp 97.6 F (36.4 C) (Oral)   Resp (!) 21   LMP 01/03/1995   SpO2 98%   Physical Exam Vitals and nursing note reviewed.  Constitutional:      General: She is not in acute distress.    Appearance: She is well-developed.  HENT:     Head: Normocephalic and atraumatic.  Eyes:     Conjunctiva/sclera: Conjunctivae normal.  Cardiovascular:     Rate and Rhythm: Normal rate and regular rhythm.     Heart sounds: No murmur heard. Pulmonary:     Effort: Pulmonary effort is normal. No respiratory distress.     Breath sounds: Normal breath sounds.  Abdominal:     Palpations: Abdomen is soft.     Tenderness: There is no abdominal tenderness.  Musculoskeletal:        General: No swelling.     Cervical back: Neck supple.     Comments: Left shoulder without obvious deformity.  Tenderness throughout.  Skin:    General: Skin is warm and dry.     Capillary Refill: Capillary refill takes less than 2 seconds.  Neurological:     Mental Status: She  is alert and oriented to person, place, and time. Mental status is at baseline.  Psychiatric:        Mood and Affect: Mood normal.     (all labs ordered are listed, but only abnormal results are displayed) Labs Reviewed  CBC - Abnormal; Notable for the following components:      Result Value   RBC 3.44 (*)    Hemoglobin 11.2 (*)    HCT 35.4 (*)    MCV 102.9 (*)    RDW 15.8 (*)    All other components within normal limits  BASIC METABOLIC PANEL WITH GFR    EKG: None  Radiology: No results found.  Procedures   Medications Ordered in the ED  oxyCODONE -acetaminophen  (PERCOCET/ROXICET) 5-325 MG per tablet 1 tablet (has no administration in time range)  fentaNYL  (SUBLIMAZE ) injection 50 mcg (50 mcg Intramuscular Given 01/22/24 0559)     Medical  Decision Making Amount and/or Complexity of Data Reviewed Labs: ordered. Radiology: ordered.  Risk Prescription drug management.   This is a 69 year old female presenting after mechanical fall.  On exam, HD stable.  Lung sounds clear bilaterally, no hypoxia.  Abdomen soft and compressible.  Neuroexam a baseline.  Left shoulder without obvious deformity.  Reduced grip strength to left upper extremity.  Will collect basic labs.  Will obtain CT head, cervical spine.  Will image left shoulder, left elbow, pelvis.  Will provide patient with fentanyl , 50 mcg for pain control.  At end of shift, workup not complete. Imaging has not been read. Signed out to oncoming provider [facility]. Plan of management discussed.     Final diagnoses:  Fall, initial encounter    ED Discharge Orders     None          Milanie Rosenfield F, PA-C 01/22/24 0647    Emil Share, DO 01/22/24 [phone]  "

## 2024-01-22 NOTE — ED Triage Notes (Signed)
 Pt had a mechanical fall at home causing her to land on her left shoulder, unable to determine deformity put has pain in the left shoulder, and left forearm. Says she did hit her head but no blood thinners. Also has complaints of bilateral hip pain after the fall. She is otherwise stable and is adamant she had no LOC and was not dizzy and did not pass out

## 2024-01-22 NOTE — ED Notes (Signed)
 Provider spoke with son about discharge instructions via phone. [place] is on his way back to pick pt up. Pt will be placed in wheelchair to meet son in front lobby.

## 2024-01-22 NOTE — Discharge Instructions (Addendum)
 Wendy [name]  Thank you for allowing us  to take care of you today.  You came to the Emergency Department today because you had a fall at home.  Here in the emergency department you do not have any bleeding in your head or broken bones in your neck, however you did unfortunately break your upper arm bone, called your humerus.  Unfortunately there is not a good cast or surgery for this, it will heal over time while you support your arm with a sling.  Please wear the sling at all times including while sleeping.  We have given you referral to follow-up with the orthopedic surgeon (bone doctor) in clinic in approximately 2 weeks.  Please use Tylenol  and ibuprofen  every 4-6 hours as needed for pain control.  Studies have shown that if you take Tylenol  and ibuprofen  together, it can be as or more effective than opioids.  If you are having severe pain despite use of Tylenol  and ibuprofen , we are also prescribing oxycodone , an opioid painkiller, you can use this for breakthrough pain every 6 hours as needed.  Please note that this medication can make you constipated, confused, and more prone to falls.  Please take a stool softener if you take an opioid pain pill.  Additionally we will prescribe tizanidine , a muscle relaxer that you can use twice a day as needed for muscle pain/spasms.  You can also further discuss your pain regimen with your hospice care provider, and they can make additional adjustments as needed.  To-Do: 1. Please follow-up with your primary doctor within 1 - 2 weeks / as soon as possible.   Please return to the Emergency Department or call 911 if you experience have worsening of your symptoms, or do not get better, difficulty moving or feeling your hand, chest pain, shortness of breath, severe or significantly worsening pain, high fever, severe confusion, pass out or have any reason to think that you need emergency medical care.   We hope you feel better soon.   Mitzie Later,  MD Department of Emergency Medicine [facility] [place]

## 2024-01-26 ENCOUNTER — Emergency Department (HOSPITAL_COMMUNITY)

## 2024-01-26 ENCOUNTER — Inpatient Hospital Stay (HOSPITAL_COMMUNITY)
Admission: EM | Admit: 2024-01-26 | Discharge: 2024-02-03 | DRG: 871 | Disposition: A | Discharge status: Skilled Nursing Facility | Attending: Internal Medicine | Admitting: Internal Medicine

## 2024-01-26 ENCOUNTER — Encounter (HOSPITAL_COMMUNITY): Payer: Self-pay | Admitting: Internal Medicine

## 2024-01-26 ENCOUNTER — Other Ambulatory Visit: Payer: Self-pay

## 2024-01-26 DIAGNOSIS — F17210 Nicotine dependence, cigarettes, uncomplicated: Secondary | ICD-10-CM | POA: Diagnosis present

## 2024-01-26 DIAGNOSIS — I5032 Chronic diastolic (congestive) heart failure: Secondary | ICD-10-CM | POA: Diagnosis present

## 2024-01-26 DIAGNOSIS — E871 Hypo-osmolality and hyponatremia: Secondary | ICD-10-CM | POA: Diagnosis present

## 2024-01-26 DIAGNOSIS — Z8249 Family history of ischemic heart disease and other diseases of the circulatory system: Secondary | ICD-10-CM | POA: Diagnosis not present

## 2024-01-26 DIAGNOSIS — E876 Hypokalemia: Secondary | ICD-10-CM | POA: Diagnosis present

## 2024-01-26 DIAGNOSIS — Z79899 Other long term (current) drug therapy: Secondary | ICD-10-CM | POA: Diagnosis not present

## 2024-01-26 DIAGNOSIS — S42202A Unspecified fracture of upper end of left humerus, initial encounter for closed fracture: Secondary | ICD-10-CM | POA: Diagnosis present

## 2024-01-26 DIAGNOSIS — Z85048 Personal history of other malignant neoplasm of rectum, rectosigmoid junction, and anus: Secondary | ICD-10-CM

## 2024-01-26 DIAGNOSIS — Y92009 Unspecified place in unspecified non-institutional (private) residence as the place of occurrence of the external cause: Secondary | ICD-10-CM

## 2024-01-26 DIAGNOSIS — W19XXXA Unspecified fall, initial encounter: Secondary | ICD-10-CM | POA: Diagnosis present

## 2024-01-26 DIAGNOSIS — F1020 Alcohol dependence, uncomplicated: Secondary | ICD-10-CM | POA: Diagnosis present

## 2024-01-26 DIAGNOSIS — Z880 Allergy status to penicillin: Secondary | ICD-10-CM

## 2024-01-26 DIAGNOSIS — B182 Chronic viral hepatitis C: Secondary | ICD-10-CM | POA: Diagnosis present

## 2024-01-26 DIAGNOSIS — Z751 Person awaiting admission to adequate facility elsewhere: Secondary | ICD-10-CM

## 2024-01-26 DIAGNOSIS — Z7982 Long term (current) use of aspirin: Secondary | ICD-10-CM | POA: Diagnosis not present

## 2024-01-26 DIAGNOSIS — E785 Hyperlipidemia, unspecified: Secondary | ICD-10-CM | POA: Diagnosis present

## 2024-01-26 DIAGNOSIS — J9611 Chronic respiratory failure with hypoxia: Secondary | ICD-10-CM | POA: Insufficient documentation

## 2024-01-26 DIAGNOSIS — N390 Urinary tract infection, site not specified: Secondary | ICD-10-CM | POA: Diagnosis present

## 2024-01-26 DIAGNOSIS — J449 Chronic obstructive pulmonary disease, unspecified: Secondary | ICD-10-CM | POA: Diagnosis present

## 2024-01-26 DIAGNOSIS — R1310 Dysphagia, unspecified: Secondary | ICD-10-CM | POA: Diagnosis present

## 2024-01-26 DIAGNOSIS — Z881 Allergy status to other antibiotic agents status: Secondary | ICD-10-CM

## 2024-01-26 DIAGNOSIS — Z800 Family history of malignant neoplasm of digestive organs: Secondary | ICD-10-CM

## 2024-01-26 DIAGNOSIS — D649 Anemia, unspecified: Secondary | ICD-10-CM | POA: Diagnosis not present

## 2024-01-26 DIAGNOSIS — Z8673 Personal history of transient ischemic attack (TIA), and cerebral infarction without residual deficits: Secondary | ICD-10-CM

## 2024-01-26 DIAGNOSIS — G9341 Metabolic encephalopathy: Secondary | ICD-10-CM | POA: Diagnosis present

## 2024-01-26 DIAGNOSIS — R319 Hematuria, unspecified: Secondary | ICD-10-CM | POA: Diagnosis present

## 2024-01-26 DIAGNOSIS — E8721 Acute metabolic acidosis: Secondary | ICD-10-CM | POA: Diagnosis present

## 2024-01-26 DIAGNOSIS — E878 Other disorders of electrolyte and fluid balance, not elsewhere classified: Secondary | ICD-10-CM | POA: Diagnosis present

## 2024-01-26 DIAGNOSIS — Z86718 Personal history of other venous thrombosis and embolism: Secondary | ICD-10-CM

## 2024-01-26 DIAGNOSIS — A419 Sepsis, unspecified organism: Principal | ICD-10-CM | POA: Diagnosis present

## 2024-01-26 DIAGNOSIS — S42202P Unspecified fracture of upper end of left humerus, subsequent encounter for fracture with malunion: Secondary | ICD-10-CM

## 2024-01-26 DIAGNOSIS — E872 Acidosis, unspecified: Secondary | ICD-10-CM

## 2024-01-26 DIAGNOSIS — I110 Hypertensive heart disease with heart failure: Secondary | ICD-10-CM | POA: Diagnosis present

## 2024-01-26 DIAGNOSIS — E8729 Other acidosis: Secondary | ICD-10-CM | POA: Diagnosis present

## 2024-01-26 DIAGNOSIS — S42302A Unspecified fracture of shaft of humerus, left arm, initial encounter for closed fracture: Secondary | ICD-10-CM | POA: Diagnosis present

## 2024-01-26 DIAGNOSIS — D638 Anemia in other chronic diseases classified elsewhere: Secondary | ICD-10-CM | POA: Diagnosis present

## 2024-01-26 DIAGNOSIS — F419 Anxiety disorder, unspecified: Secondary | ICD-10-CM | POA: Diagnosis present

## 2024-01-26 DIAGNOSIS — Z888 Allergy status to other drugs, medicaments and biological substances status: Secondary | ICD-10-CM

## 2024-01-26 DIAGNOSIS — S42292P Other displaced fracture of upper end of left humerus, subsequent encounter for fracture with malunion: Secondary | ICD-10-CM | POA: Diagnosis not present

## 2024-01-26 LAB — COMPREHENSIVE METABOLIC PANEL WITH GFR
ALT: 51 U/L — ABNORMAL HIGH (ref 0–44)
AST: 94 U/L — ABNORMAL HIGH (ref 15–41)
Albumin: 3.9 g/dL (ref 3.5–5.0)
Alkaline Phosphatase: 121 U/L (ref 38–126)
Anion gap: 25 — ABNORMAL HIGH (ref 5–15)
BUN: 10 mg/dL (ref 8–23)
CO2: 17 mmol/L — ABNORMAL LOW (ref 22–32)
Calcium: 9.4 mg/dL (ref 8.9–10.3)
Chloride: 88 mmol/L — ABNORMAL LOW (ref 98–111)
Creatinine, Ser: 0.89 mg/dL (ref 0.44–1.00)
GFR, Estimated: >60 mL/min
Glucose, Bld: 88 mg/dL (ref 70–99)
Potassium: 2.8 mmol/L — ABNORMAL LOW (ref 3.5–5.1)
Sodium: 130 mmol/L — ABNORMAL LOW (ref 135–145)
Total Bilirubin: 1.8 mg/dL — ABNORMAL HIGH (ref 0.0–1.2)
Total Protein: 7.1 g/dL (ref 6.5–8.1)

## 2024-01-26 LAB — CBC WITH DIFFERENTIAL/PLATELET
Abs Immature Granulocytes: 0.06 K/uL (ref 0.00–0.07)
Basophils Absolute: 0 K/uL (ref 0.0–0.1)
Basophils Relative: 0 %
Eosinophils Absolute: 0 K/uL (ref 0.0–0.5)
Eosinophils Relative: 0 %
HCT: 28.7 % — ABNORMAL LOW (ref 36.0–46.0)
Hemoglobin: 9.5 g/dL — ABNORMAL LOW (ref 12.0–15.0)
Immature Granulocytes: 0 %
Lymphocytes Relative: 2 %
Lymphs Abs: 0.4 K/uL — ABNORMAL LOW (ref 0.7–4.0)
MCH: 32.5 pg (ref 26.0–34.0)
MCHC: 33.1 g/dL (ref 30.0–36.0)
MCV: 98.3 fL (ref 80.0–100.0)
Monocytes Absolute: 0.7 K/uL (ref 0.1–1.0)
Monocytes Relative: 4 %
Neutro Abs: 15.1 K/uL — ABNORMAL HIGH (ref 1.7–7.7)
Neutrophils Relative %: 94 %
Platelets: 334 K/uL (ref 150–400)
RBC: 2.92 MIL/uL — ABNORMAL LOW (ref 3.87–5.11)
RDW: 14.9 % (ref 11.5–15.5)
WBC: 16.3 K/uL — ABNORMAL HIGH (ref 4.0–10.5)
nRBC: 0 % (ref 0.0–0.2)

## 2024-01-26 LAB — URINALYSIS, ROUTINE W REFLEX MICROSCOPIC
Bilirubin Urine: NEGATIVE
Glucose, UA: NEGATIVE mg/dL
Ketones, ur: 5 mg/dL — AB
Nitrite: NEGATIVE
Protein, ur: NEGATIVE mg/dL
Specific Gravity, Urine: 1.006 (ref 1.005–1.030)
pH: 5 (ref 5.0–8.0)

## 2024-01-26 LAB — APTT: aPTT: 44 s — ABNORMAL HIGH (ref 24–36)

## 2024-01-26 LAB — BLOOD GAS, VENOUS
Acid-base deficit: 5.6 mmol/L — ABNORMAL HIGH (ref 0.0–2.0)
Bicarbonate: 20.1 mmol/L (ref 20.0–28.0)
O2 Saturation: 54.4 %
Patient temperature: 37
pCO2, Ven: 39 mmHg — ABNORMAL LOW (ref 44–60)
pH, Ven: 7.32 (ref 7.25–7.43)
pO2, Ven: 36 mmHg (ref 32–45)

## 2024-01-26 LAB — URINE DRUG SCREEN
Amphetamines: NEGATIVE
Barbiturates: NEGATIVE
Benzodiazepines: NEGATIVE
Cocaine: NEGATIVE
Fentanyl: NEGATIVE
Methadone Scn, Ur: NEGATIVE
Opiates: POSITIVE — AB
Tetrahydrocannabinol: POSITIVE — AB

## 2024-01-26 LAB — PROTIME-INR
INR: 1.1 (ref 0.8–1.2)
Prothrombin Time: 14.8 s (ref 11.4–15.2)

## 2024-01-26 LAB — MAGNESIUM: Magnesium: 1.7 mg/dL (ref 1.7–2.4)

## 2024-01-26 LAB — ETHANOL: Alcohol, Ethyl (B): <15 mg/dL

## 2024-01-26 LAB — CBG MONITORING, ED: Glucose-Capillary: 100 mg/dL — ABNORMAL HIGH (ref 70–99)

## 2024-01-26 LAB — LACTIC ACID, PLASMA
Lactic Acid, Venous: 2.3 mmol/L (ref 0.5–1.9)
Lactic Acid, Venous: 2.5 mmol/L (ref 0.5–1.9)

## 2024-01-26 LAB — BETA-HYDROXYBUTYRIC ACID: Beta-Hydroxybutyric Acid: 3.17 mmol/L — ABNORMAL HIGH (ref 0.05–0.27)

## 2024-01-26 MED ORDER — DIPHENHYDRAMINE HCL 50 MG/ML IJ SOLN
12.5000 mg | Freq: Once | INTRAMUSCULAR | Status: AC
  Administered 2024-01-26: 12.5 mg via INTRAVENOUS
  Filled 2024-01-26: qty 1

## 2024-01-26 MED ORDER — POTASSIUM CHLORIDE IN NACL 20-0.9 MEQ/L-% IV SOLN
INTRAVENOUS | Status: AC
  Filled 2024-01-26 (×2): qty 1000

## 2024-01-26 MED ORDER — PROCHLORPERAZINE EDISYLATE 10 MG/2ML IJ SOLN
5.0000 mg | INTRAMUSCULAR | Status: DC | PRN
  Administered 2024-01-31: 5 mg via INTRAVENOUS
  Filled 2024-01-26: qty 2

## 2024-01-26 MED ORDER — SODIUM CHLORIDE 0.9 % IV BOLUS
1000.0000 mL | Freq: Once | INTRAVENOUS | Status: AC
  Administered 2024-01-26: 1000 mL via INTRAVENOUS

## 2024-01-26 MED ORDER — SODIUM CHLORIDE 0.9 % IV SOLN
1.0000 g | Freq: Once | INTRAVENOUS | Status: AC
  Administered 2024-01-26: 1 g via INTRAVENOUS
  Filled 2024-01-26: qty 10

## 2024-01-26 MED ORDER — POTASSIUM CHLORIDE 20 MEQ PO PACK
40.0000 meq | PACK | Freq: Once | ORAL | Status: AC
  Administered 2024-01-26: 40 meq via ORAL
  Filled 2024-01-26: qty 2

## 2024-01-26 MED ORDER — SODIUM CHLORIDE 0.9 % IV SOLN
1.0000 g | INTRAVENOUS | Status: DC
  Administered 2024-01-27: 1 g via INTRAVENOUS
  Filled 2024-01-26 (×2): qty 10

## 2024-01-26 MED ORDER — SODIUM CHLORIDE 0.9 % IV BOLUS
500.0000 mL | Freq: Once | INTRAVENOUS | Status: AC
  Administered 2024-01-26: 500 mL via INTRAVENOUS

## 2024-01-26 MED ORDER — ASPIRIN 81 MG PO TBEC
81.0000 mg | DELAYED_RELEASE_TABLET | Freq: Every day | ORAL | Status: DC
  Administered 2024-01-26 – 2024-02-03 (×9): 81 mg via ORAL
  Filled 2024-01-26 (×9): qty 1

## 2024-01-26 MED ORDER — OXYCODONE HCL 5 MG PO TABS
10.0000 mg | ORAL_TABLET | Freq: Four times a day (QID) | ORAL | Status: DC | PRN
  Administered 2024-01-27 – 2024-02-03 (×24): 10 mg via ORAL
  Filled 2024-01-26 (×25): qty 2

## 2024-01-26 MED ORDER — IOHEXOL 350 MG/ML SOLN
75.0000 mL | Freq: Once | INTRAVENOUS | Status: AC | PRN
  Administered 2024-01-26: 75 mL via INTRAVENOUS

## 2024-01-26 MED ORDER — MAGNESIUM SULFATE 2 GM/50ML IV SOLN
2.0000 g | Freq: Once | INTRAVENOUS | Status: AC
  Administered 2024-01-26: 2 g via INTRAVENOUS
  Filled 2024-01-26: qty 50

## 2024-01-26 MED ORDER — POTASSIUM CHLORIDE CRYS ER 20 MEQ PO TBCR
40.0000 meq | EXTENDED_RELEASE_TABLET | Freq: Once | ORAL | Status: DC
  Filled 2024-01-26: qty 2

## 2024-01-26 MED ORDER — OXYCODONE HCL 5 MG PO TABS
5.0000 mg | ORAL_TABLET | Freq: Four times a day (QID) | ORAL | Status: DC | PRN
  Administered 2024-01-27 – 2024-02-01 (×5): 5 mg via ORAL
  Filled 2024-01-26 (×6): qty 1

## 2024-01-26 MED ORDER — ACETAMINOPHEN 650 MG RE SUPP: 650.0000 mg | Freq: Four times a day (QID) | RECTAL | Status: DC | PRN

## 2024-01-26 MED ORDER — ACETAMINOPHEN 325 MG PO TABS
650.0000 mg | ORAL_TABLET | Freq: Four times a day (QID) | ORAL | Status: DC | PRN
  Administered 2024-01-26 – 2024-02-01 (×5): 650 mg via ORAL
  Filled 2024-01-26 (×6): qty 2

## 2024-01-26 MED ORDER — MORPHINE SULFATE (PF) 2 MG/ML IV SOLN
1.0000 mg | Freq: Once | INTRAVENOUS | Status: AC
  Administered 2024-01-26: 1 mg via INTRAVENOUS
  Filled 2024-01-26: qty 1

## 2024-01-26 MED ORDER — POTASSIUM CHLORIDE 10 MEQ/100ML IV SOLN
10.0000 meq | Freq: Once | INTRAVENOUS | Status: AC
  Administered 2024-01-26: 10 meq via INTRAVENOUS
  Filled 2024-01-26: qty 100

## 2024-01-26 MED ORDER — PANTOPRAZOLE SODIUM 40 MG PO TBEC
40.0000 mg | DELAYED_RELEASE_TABLET | Freq: Every day | ORAL | Status: DC
  Administered 2024-01-26 – 2024-02-03 (×9): 40 mg via ORAL
  Filled 2024-01-26 (×9): qty 1

## 2024-01-26 MED ORDER — LISINOPRIL 5 MG PO TABS
5.0000 mg | ORAL_TABLET | Freq: Every day | ORAL | Status: DC
  Administered 2024-01-26 – 2024-01-28 (×3): 5 mg via ORAL
  Filled 2024-01-26 (×3): qty 1

## 2024-01-26 MED ORDER — OXYCODONE HCL 5 MG PO TABS
5.0000 mg | ORAL_TABLET | Freq: Four times a day (QID) | ORAL | Status: DC | PRN
  Administered 2024-01-26: 5 mg via ORAL
  Filled 2024-01-26: qty 1

## 2024-01-26 NOTE — ED Notes (Signed)
 PA aware, unable to get second set of blood cultures, patient behavior.  Patient c/o abdominal itching and keeps detaching monitor wires.

## 2024-01-26 NOTE — Progress Notes (Signed)
 Orthopedic Tech Progress Note Patient Details:  Wendy [name] 02/22/1955 [id number] Removed soiled sling immobilizer and applied new sling immobilizer per order.  Ortho Devices Type of Ortho Device: Sling immobilizer Ortho Device/Splint Location: LUE Ortho Device/Splint Interventions: Application, Ordered, Adjustment   Post Interventions Patient Tolerated: Well Instructions Provided: Adjustment of device, Care of device, Poper ambulation with device  Morna Pink 01/26/2024, 5:14 PM

## 2024-01-26 NOTE — Progress Notes (Signed)
 This patient has received 75 ml's of IV iehexol 350 contrast extravasation into the Right Forearm during a CTA Head & Neck exam.  The exam was performed on (date) and (time): 01/26/2024 0910  Site / affected area assessed by Terrall Learn, PA-C  Provider contacted: Terrall Learn, PA-C Provider contacted date/time: [phone]

## 2024-01-26 NOTE — ED Notes (Signed)
 Unsuccessful US  PIV attempt in upper R arm, pt has little venous access on ultrasound to R arm, L arm restricted at this time due to injury, notified EDP.

## 2024-01-26 NOTE — Consult Note (Signed)
 "  ORTHOPAEDIC CONSULTATION  REQUESTING PHYSICIAN: Freddi Hamilton, MD  PCP:  Pcp, No  Chief Complaint: Left shoulder pain  HPI: Wendy [name] is a 69 y.o. female who complains of  left shoulder pain s/p fall.  The patient had sustained a left proximal humerus fracture s/p a fall and was treated non operatively.  She then had a second mechanical fall this morning and was found down by her husband.  The patient is unable to provide significant history and had vomit in her hair and sling.  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  She has sensation in the left upper extremity, but no motor.    Past Medical History:  Diagnosis Date   Adenocarcinoma of colon (HCC) 07/2007   Alcohol dependency (HCC)    Allergy    Anxiety    Hep C w/o coma, chronic (HCC)    remote h/o IVD   Hepatitis C    Herpes genitalia    History of DVT (deep vein thrombosis) 12/2007   R jugular vein   HSV-1 (herpes simplex virus 1) infection    HSV-2 (herpes simplex virus 2) infection    Hypertension    Migraine headache    Rectal cancer [facility])    Past Surgical History:  Procedure Laterality Date   ABDOMINAL HYSTERECTOMY  1996   With bilateral oophorectomy.   APPENDECTOMY  1996   BLADDER REPAIR     CLOSED REDUCTION NASAL FRACTURE N/A 10/12/2012   Procedure: CLOSED REDUCTION NASAL BONE FRACTURE WITH STABILZATION ;  Surgeon: Lonni LITTIE Sax, DDS;  Location: [facility] OR;  Service: Oral Surgery;  Laterality: N/A;   COLON SURGERY     COLONOSCOPY W/ BIOPSIES AND POLYPECTOMY     Hx; of   LEFT HEART CATH AND CORONARY ANGIOGRAPHY N/A 12/08/2023   Procedure: LEFT HEART CATH AND CORONARY ANGIOGRAPHY;  Surgeon: Mady Lonni, MD;  Location: [facility] INVASIVE CV LAB;  Service: Cardiovascular;  Laterality: N/A;   TONSILLECTOMY     TONSILLECTOMY AND ADENOIDECTOMY     Social History    Socioeconomic History   Marital status: Single    Spouse name: Not on file   Number of children: Not on file   Years of education: Not on file   Highest education level: Not on file  Occupational History   Not on file  Tobacco Use   Smoking status: Every Day    Current packs/day: 1.50    Types: Cigarettes   Smokeless tobacco: Never  Vaping Use   Vaping status: Never Used  Substance and Sexual Activity   Alcohol use: Yes    Alcohol/week: 20.0 standard drinks of alcohol    Types: 20 Shots of liquor per week   Drug use: No   Sexual activity: Not Currently  Other Topics Concern   Not on file  Social History Narrative   Not on file   Social Drivers of Health   Tobacco Use: High Risk (12/05/2023)   Patient History    Smoking Tobacco Use: Every Day    Smokeless Tobacco Use: Never    Passive Exposure: Not on file  Financial Resource Strain: Not on file  Food Insecurity: Food Insecurity Present (12/05/2023)   Epic    Worried About Programme Researcher, Broadcasting/film/video in the Last Year: Sometimes true    [employer] of Food in the Last Year: Sometimes  true  Transportation Needs: No Transportation Needs (12/05/2023)   Epic    Lack of Transportation (Medical): No    Lack of Transportation (Non-Medical): No  Physical Activity: Not on file  Stress: Not on file  Social Connections: Socially Isolated (12/05/2023)   Social Connection and Isolation Panel    Frequency of Communication with Friends and Family: Twice a week    Frequency of Social Gatherings with Friends and Family: More than three times a week    Attends Religious Services: Never    Database Administrator or Organizations: No    Attends Banker Meetings: Never    Marital Status: Separated  Depression (PHQ2-9): Not on file  Alcohol Screen: Not on file  Housing: High Risk (12/05/2023)   Epic    Unable to Pay for Housing in the Last Year: Yes    Number of Times Moved in the Last Year: 0    Homeless in the Last Year: No   Utilities: At Risk (12/05/2023)   Epic    Threatened with loss of utilities: Yes  Health Literacy: Not on file   Family History  Problem Relation Age of Onset   Colon cancer Mother    Hypertension Mother    Hyperparathyroidism Mother    Colon cancer Father    Cancer Father    Cancer Other    Allergies[1] Prior to Admission medications  Medication Sig Start Date End Date Taking? Authorizing Provider  acetaminophen  (TYLENOL ) 325 MG tablet Take 650 mg by mouth every 6 (six) hours as needed for mild pain (pain score 1-3) or headache. Patient not taking: Reported on 12/05/2023    [provider]  albuterol  (VENTOLIN  HFA) 108 (90 Base) MCG/ACT inhaler Inhale 2 puffs into the lungs every 6 (six) hours as needed for wheezing or shortness of breath. 11/17/23   Singh, Prashant K, MD  aspirin  EC 81 MG tablet Take 1 tablet (81 mg total) by mouth daily. Swallow whole. 11/17/23   Singh, Prashant K, MD  furosemide  (LASIX ) 20 MG tablet Take 1 tablet (20 mg total) by mouth daily. 12/29/23     furosemide  (LASIX ) 40 MG tablet Take 1 tablet (40 mg total) by mouth daily. 12/10/23   Vernon Ranks, MD  lisinopril  (ZESTRIL ) 10 MG tablet Take 1 tablet (10 mg total) by mouth daily. 12/10/23 01/09/24  Vernon Ranks, MD  oxyCODONE  (ROXICODONE ) 5 MG immediate release tablet Take 1 tablet (5 mg total) by mouth every 6 (six) hours as needed for up to 5 days for severe pain (pain score 7-10) or breakthrough pain. 01/22/24 01/27/24  Rogelia Jerilynn RAMAN, MD  pantoprazole  (PROTONIX ) 40 MG tablet Take 1 tablet (40 mg total) by mouth daily. 12/29/23     rosuvastatin  (CRESTOR ) 20 MG tablet Take 1 tablet (20 mg total) by mouth daily. 12/10/23   Vernon Ranks, MD  tiZANidine  (ZANAFLEX ) 4 MG tablet Take 1 tablet (4 mg total) by mouth every 8 (eight) hours as needed for muscle spasms. 01/22/24   Rogelia Jerilynn RAMAN, MD   DG Shoulder 1V Left Result Date: 01/26/2024 EXAM: 1 VIEW XRAY OF THE LEFT SHOULDER 01/26/2024 10:38:00 AM  COMPARISON: Left humerus series 08/03/2024 07:21:00 PM. CLINICAL HISTORY: 69 year old female. Proximal humerus fracture. Found down on floor. FINDINGS: BONES AND JOINTS: Acute displaced fracture of the proximal humerus with mild apex anterior angulation. The left humeral head fragment remains normally aligned with the left glenoid. SOFT TISSUES: Visualized lung is unremarkable. IMPRESSION: 1. Acute displaced left proximal humerus fracture  with Left glenohumeral alignment maintained. Electronically signed by: Helayne Hurst MD MD 01/26/2024 11:08 AM EST RP Workstation: [id number]   CT CERVICAL SPINE WO CONTRAST Result Date: 01/26/2024 EXAM: CT CERVICAL SPINE WITHOUT CONTRAST 01/26/2024 10:45:29 AM TECHNIQUE: CT of the cervical spine was performed WITHOUT the administration of intravenous contrast. Multiplanar reformatted images are provided for review. Automated exposure control, iterative reconstruction, and/or weight based adjustment of the mA/kV was utilized to reduce the radiation dose to as low as reasonably achievable. COMPARISON: CT cervical spine 01/22/2024. Head CT reported separately today. CLINICAL HISTORY: 69 year old female. Fall. Found down on floor. FINDINGS: BONES AND ALIGNMENT: Osteopenia appears age advanced with superimposed confluent and degenerative appearing endplate and vertebral body sclerosis in the mid and lower cervical spine. Stable alignment with degenerative anterolisthesis of C3 on C4 and degenerative left facet ankylosis at that level. No acute fracture or traumatic malalignment. DEGENERATIVE CHANGES: Chronic severe disc and endplate degeneration C3-C4 through C6-C7. Vacuum disc at C5-C6. Degenerative cervical spinal stenosis suspected and likely severe at the C5-C6 level (series 13 image 58). SOFT TISSUES: Bulky multifocal calcified cervical carotid artery atherosclerosis. Otherwise negative visible non-contrast neck soft tissues. No prevertebral soft tissue swelling. LUNGS: Emphysema  and mild scarring in the lung apices. IMPRESSION: 1. No acute traumatic injury identified in the cervical spine. 2. Very advanced cervical spine degeneration with evidence of SEVERE multifactorial cervical spinal stenosis at C5-C6. 3. Emphysema. Electronically signed by: Helayne Hurst MD 01/26/2024 11:06 AM EST RP Workstation: [id number]   CT HEAD WO CONTRAST ( ) Result Date: 01/26/2024 EXAM: CT HEAD WITHOUT CONTRAST 01/26/2024 10:45:29 AM TECHNIQUE: CT of the head was performed without the administration of intravenous contrast. Automated exposure control, iterative reconstruction, and/or weight based adjustment of the mA/kV was utilized to reduce the radiation dose to as low as reasonably achievable. COMPARISON: Head CT 01/22/2024. CLINICAL HISTORY: 69 year old female. Left-sided facial droop, possible stroke-like symptoms. Found down on floor. FINDINGS: BRAIN AND VENTRICLES: No acute hemorrhage. No evidence of acute infarct. No hydrocephalus. No extra-axial collection. No mass effect or midline shift. Brain volume stable and at the lower limits of normal for age. Moderate patchy bilateral cerebral Isabellarose Kope matter hypodensity is stable. Calcified atherosclerosis at the skull base. No suspicious intracranial vascular hyperdensity. ORBITS: No acute abnormality. SINUSES: Paranasal sinuses remain well aerated. SOFT TISSUES AND SKULL: No acute soft tissue abnormality. No skull fracture. Tympanic cavities and mastoids remain well aerated. IMPRESSION: 1. No acute traumatic injury or intracranial abnormality identified. 2. Stable moderately advanced cerebral Lexus Shampine matter disease. Electronically signed by: Helayne Hurst MD MD 01/26/2024 11:02 AM EST RP Workstation: [id number]   DG Elbow 2 Views Left Result Date: 01/26/2024 EXAM: 1 or 2 VIEW(S) XRAY OF THE LEFT ELBOW COMPARISON: 01/22/2024. CLINICAL HISTORY: fall, concern for recurrent injury FINDINGS: LIMITATIONS: Limited evaluation due to positioning. BONES AND JOINTS:  Possible anterior joint effusion. Equivocal cortical defect along the radial head. Differential diagnosis for the cortical defect includes a non-displaced fracture, an old injury, or a normal variant. Follow-up imaging or clinical correlation is recommended. No malalignment. SOFT TISSUES: Unremarkable. IMPRESSION: 1. Limited evaluation due to positioning. 2. Possible anterior joint effusion with equivocal cortical defect along the radial head; recommend immobilization and follow-up radiographs in 7-10 days. Electronically signed by: Waddell Calk MD MD 01/26/2024 07:52 AM EST RP Workstation: [id number]   DG Shoulder Left Portable Result Date: 01/26/2024 EXAM: 1 VIEW(S) XRAY OF THE LEFT SHOULDER 01/26/2024 07:40:00 AM COMPARISON: 01/22/2024 CLINICAL HISTORY: known fracture, recurrent fall FINDINGS: BONES AND JOINTS:  Comminuted fracture of the left humeral neck and greater tuberosity with apex lateral angulation of 60 degrees. Increased proximal displacement of the distal fracture fragments relative to the humeral head. The [facility] joint is unremarkable. SOFT TISSUES: Diffuse soft tissue swelling. No abnormal calcifications. Visualized lung is unremarkable. IMPRESSION: 1. Comminuted fracture of the left humeral neck and greater tuberosity with apex lateral angulation of 60 degrees and increased proximal displacement of the distal fracture fragments relative to the humeral head, compared to 01/22/2024. 2. Diffuse soft tissue swelling. Electronically signed by: Waddell Calk MD MD 01/26/2024 07:48 AM EST RP Workstation: [id number]   DG Chest Portable 1 View Result Date: 01/26/2024 EXAM: 1 VIEW(S) XRAY OF THE CHEST 01/26/2024 07:40:00 AM COMPARISON: 12/06/2023 CLINICAL HISTORY: fall FINDINGS: LUNGS AND PLEURA: No focal pulmonary opacity. No pleural effusion. No pneumothorax. HEART AND MEDIASTINUM: Aortic calcification. No acute abnormality of the cardiac and mediastinal silhouettes. BONES AND SOFT TISSUES: Acute  displaced fracture of surgical neck of left proximal humerus. IMPRESSION: 1. Acute displaced fracture of the surgical neck of the left proximal humerus. 2. No acute cardiopulmonary findings. Electronically signed by: Waddell Calk MD MD 01/26/2024 07:45 AM EST RP Workstation: [id number]   DG Pelvis Portable Result Date: 01/26/2024 EXAM: 1 OR 2 VIEW(S) XRAY OF THE PELVIS 01/26/2024 07:40:00 AM COMPARISON: 01/22/2024 CLINICAL HISTORY: fall FINDINGS: BONES AND JOINTS: No acute fracture. No malalignment. SOFT TISSUES: Atherosclerotic plaque. IMPRESSION: 1. No evidence of acute traumatic injury. Electronically signed by: Waddell Calk MD MD 01/26/2024 07:44 AM EST RP Workstation: [id number]    Positive ROS: All other systems have been reviewed and were otherwise negative with the exception of those mentioned in the HPI and as above.  Physical Exam: General: Alert, no acute distress Cardiovascular: No pedal edema Respiratory: No cyanosis, no use of accessory musculature GI: No organomegaly, abdomen is soft and non-tender Skin: No lesions in the area of chief complaint Neurologic: Sensation intact distally Psychiatric: Patient is competent for consent with normal mood and affect Lymphatic: No axillary or cervical lymphadenopathy  MUSCULOSKELETAL: Left upper extremity - skin intact - ecchymosis extending to the elbow with swelling - wasting of the musculature from the shoulder to the hand. - TTP proximal humerus - Sensation intact to axillary, radial, median, and ulnar nerve distributions - No motor consistent with known CVA deficits - 2+ radial pulse  Imaging:  X-rays: 3 views of  left shoulder were obtained and reviewed.  My independent interpretation is as follows: There is a located proximal humerus fracture with interval displacement and worsening angulation of the proximal humerus.  Assessment: 69 year old female with left displaced proximal humerus fracture s/p fall with worsening  alignment.  Discussion was had both with the patient and her family.  Given that the patient has no motor function of the left upper extremity and significant medical commorbidities such that the family is considering hospice, we will continue to treat the patient non operatively in a sling.  Plan: NWB LUE in sling No plan for surgical intervention Pain control with oxycodone  and tylenol  The patient may follow up in my clinic in 2 weeks for recheck  Rankin LELON Pizza, MD    01/26/2024 11:57 AM      [1]  Allergies Allergen Reactions   Amoxicillin Rash   Vancomycin Itching    Erythema and mild itching -  Resolved with IV Benadryl .   Erythromycin Base Hives and Other (See Comments)    Pt states it makes her shaky.   "

## 2024-01-26 NOTE — ED Notes (Signed)
 No BP or sticks arm band placed on left arm.  Fall band placed on right arm.  Sign on room door to leave door open for patient safety and observation.

## 2024-01-26 NOTE — ED Notes (Incomplete)
 Family requested assistance placing patient in a facility

## 2024-01-26 NOTE — ED Notes (Signed)
 Order for NS@20K  not yet received from pharmacy, message sent to pharmacy.

## 2024-01-26 NOTE — Progress Notes (Addendum)
= ° ° °  Patient: Wendy [name] MRN: [id number]   DOA: 01/26/2024  The case was discussed and the chart reviewed with the emergency department provider.  Worsened LUE fracture with today's fall, hyponatremic, hypokalemic with abnormal LFTs and metabolic acidosis.  We will obtain lactic acid, beta hydroxybutyric acid level along with a venous blood gas to determine inpatient disposition.  Plan of care: The patient is accepted for admission to Progressive or Stepdown unit, at [facility] pending above mentioned labs.   Author: Alm Dorn Castor, MD 01/26/2024  Check [email or web address] for on-call coverage.  Nursing staff, Please call [facility] Admits & Consults System-Wide number on Amion as soon as patient's arrival, so appropriate admitting provider can evaluate the pt.

## 2024-01-26 NOTE — ED Triage Notes (Signed)
 Pt BIB EMS D/T being found on floor by husband. Unknown how long pt was on floor. Hx of stroke/tia and Heart attack. Broken left arm. Spouse notice slower speech and mental decline. Last normal was before bedtime.   However, pt is A/Ox4, stroke deficits on lt side  Lungs: rales and rhonchi, labored breathing. Fever. Bruising on left side.   CBG: 124 BP; 182/78 PULSE: 128 93 ON RA 26 RR

## 2024-01-26 NOTE — H&P (Signed)
 " History and Physical    Patient: Wendy [name] FMW:[id number] DOB: 12-23-1955 DOA: 01/26/2024 DOS: the patient was seen and examined on 01/26/2024 PCP: Pcp, No  Patient coming from: Home  Chief Complaint:  Chief Complaint  Patient presents with   Fall   HPI: Wendy [name] is a 69 y.o. female with medical history significant of rectal adenocarcinoma alcohol dependency, seasonal allergies, anxiety, chronic hep C, HSV-1 and HSV-2, history of DVT of the right tubular vein, hypertension, migraine headaches, history of TIA/CVA who was seen in the emergency department 4 days ago in the early morning due to having a mechanical fall landing on her left side, fracturing her proximal humerus who today was brought to the emergency department via EMS after being found on the floor by her husband.  She is able to answer simple questions, but is unable to elaborate on her HPI.  Denies headache, chest, back or abdominal pain at the time of my examination.  Lab work: Urinalysis showed moderate hemoglobin, small leukocyte esterase, ketones of 5 mg/dL, 88-79 WBC and rare bacteria.  UDS was positive for opiates and THC.  CBC showed white count of 16.3 with 94% neutrophils, hemoglobin 9.5 g/dL and platelets 665.  Normal PT and INR.  APTT was 44 seconds.  CMP showed a sodium of 130, potassium 2.8, chloride 88 and CO2 17 mmol/L with an anion gap of 25.  AST 94 and ALT 51 units/L.  Total bilirubin 1.8 mg/dL.  Normal glucose, renal function, calcium , total protein, albumin and alkaline phosphatase.  Magnesium  was 1.7 and alcohol less than 15 mg/dL.  Lactic acid is 2.3 mmol/L.  Imaging: Left shoulder x-ray show comminuted fracture of the left humeral neck and greater tuberosity with apex lateral angulation of 60 degrees and increased proximal displacement of the distal fracture fragments relative to the humeral head compared to 01/22/2024.  Diffuse soft tissue swelling.  Left elbow x-ray was limited due to positioning.   There is possible anterior joint effusion with equivocal cortical defect along the radial head.  Second left shoulder x-ray showed acute displaced left proximal humerus fracture with left glenohumeral alignment maintained.  Pelvics x-ray no evidence of traumatic injury.  Portable 1 view chest radiograph with no acute cardiopulmonary findings.  CT head without contrast with no traumatic injury or intracranial abnormality.  There is moderate through the advanced cerebral white matter disease, which seems stable when compared to previous imaging.  CT cervical spine with no acute traumatic injury.  There is a very advanced cervical spine degeneration with evidence of severe multifactorial cervical spinal stenosis at C5-C6.  Emphysema.  ED course: Initial vital signs were temperature 98 F, pulse 75, respiration 19, BP 104/79 mmHg and O2 sat 94% on room air.  Patient received ceftriaxone  1 g IVPB, Benadryl  12.5 mg IVP, morphine  1 mg IVP, KCl 40 mEq p.o. x 1 KCl 10 mEq IVPB x 1 and 1500 mL of normal saline bolus.  Review of Systems: As mentioned in the history of present illness. All other systems reviewed and are negative. Past Medical History:  Diagnosis Date   Adenocarcinoma of colon (HCC) 07/2007   Alcohol dependency (HCC)    Allergy    Anxiety    Hep C w/o coma, chronic (HCC)    remote h/o IVD   Hepatitis C    Herpes genitalia    History of DVT (deep vein thrombosis) 12/2007   R jugular vein   HSV-1 (herpes simplex virus 1) infection  HSV-2 (herpes simplex virus 2) infection    Hypertension    Migraine headache    Rectal cancer [facility])    Past Surgical History:  Procedure Laterality Date   ABDOMINAL HYSTERECTOMY  1996   With bilateral oophorectomy.   APPENDECTOMY  1996   BLADDER REPAIR     CLOSED REDUCTION NASAL FRACTURE N/A 10/12/2012   Procedure: CLOSED REDUCTION NASAL BONE FRACTURE WITH STABILZATION ;  Surgeon: Lonni LITTIE Sax, DDS;  Location: [facility];  Service: Oral Surgery;   Laterality: N/A;   COLON SURGERY     COLONOSCOPY W/ BIOPSIES AND POLYPECTOMY     Hx; of   LEFT HEART CATH AND CORONARY ANGIOGRAPHY N/A 12/08/2023   Procedure: LEFT HEART CATH AND CORONARY ANGIOGRAPHY;  Surgeon: Mady Lonni, MD;  Location: [facility] INVASIVE CV LAB;  Service: Cardiovascular;  Laterality: N/A;   TONSILLECTOMY     TONSILLECTOMY AND ADENOIDECTOMY     Social History:  reports that she has been smoking cigarettes. She has never used smokeless tobacco. She reports current alcohol use of about 20.0 standard drinks of alcohol per week. She reports that she does not use drugs.  Allergies[1]  Family History  Problem Relation Age of Onset   Colon cancer Mother    Hypertension Mother    Hyperparathyroidism Mother    Colon cancer Father    Cancer Father    Cancer Other     Prior to Admission medications  Medication Sig Start Date End Date Taking? Authorizing Provider  lisinopril  (ZESTRIL ) 5 MG tablet Take 5 mg by mouth daily. 12/24/23  Yes [provider]  acetaminophen  (TYLENOL ) 325 MG tablet Take 650 mg by mouth every 6 (six) hours as needed for mild pain (pain score 1-3) or headache. Patient not taking: Reported on 12/05/2023    [provider]  albuterol  (VENTOLIN  HFA) 108 (90 Base) MCG/ACT inhaler Inhale 2 puffs into the lungs every 6 (six) hours as needed for wheezing or shortness of breath. 11/17/23   Singh, Prashant K, MD  aspirin  EC 81 MG tablet Take 1 tablet (81 mg total) by mouth daily. Swallow whole. 11/17/23   Singh, Prashant K, MD  furosemide  (LASIX ) 20 MG tablet Take 1 tablet (20 mg total) by mouth daily. 12/29/23     furosemide  (LASIX ) 40 MG tablet Take 1 tablet (40 mg total) by mouth daily. 12/10/23   Vernon Ranks, MD  lisinopril  (ZESTRIL ) 10 MG tablet Take 1 tablet (10 mg total) by mouth daily. 12/10/23 01/09/24  Vernon Ranks, MD  oxyCODONE  (ROXICODONE ) 5 MG immediate release tablet Take 1 tablet (5 mg total) by mouth every 6 (six) hours as needed  for up to 5 days for severe pain (pain score 7-10) or breakthrough pain. 01/22/24 01/27/24  Rogelia Jerilynn RAMAN, MD  pantoprazole  (PROTONIX ) 40 MG tablet Take 1 tablet (40 mg total) by mouth daily. 12/29/23     rosuvastatin  (CRESTOR ) 20 MG tablet Take 1 tablet (20 mg total) by mouth daily. 12/10/23   Vernon Ranks, MD  tiZANidine  (ZANAFLEX ) 4 MG tablet Take 1 tablet (4 mg total) by mouth every 8 (eight) hours as needed for muscle spasms. 01/22/24   Rogelia Jerilynn RAMAN, MD    Physical Exam: Vitals:   01/26/24 9347 01/26/24 0700 01/26/24 0736 01/26/24 1127  BP:  104/79  (!) 164/81  Pulse:  87  99  Resp: 19 12  17   Temp: 98 F (36.7 C)   98.1 F (36.7 C)  TempSrc: Oral     SpO2:  94% 94% 96%   Physical Exam Vitals and nursing note reviewed.  Constitutional:      General: She is awake. She is not in acute distress.    Appearance: She is ill-appearing.  HENT:     Head: Normocephalic.     Nose: No rhinorrhea.     Mouth/Throat:     Mouth: Mucous membranes are dry.  Eyes:     General: No scleral icterus.    Pupils: Pupils are equal, round, and reactive to light.  Neck:     Vascular: No JVD.  Cardiovascular:     Rate and Rhythm: Normal rate and regular rhythm.     Heart sounds: S1 normal and S2 normal.  Pulmonary:     Breath sounds: No wheezing, rhonchi or rales.  Abdominal:     General: Bowel sounds are normal. There is no distension.     Palpations: Abdomen is soft.     Tenderness: There is no abdominal tenderness. There is no right CVA tenderness or left CVA tenderness.  Musculoskeletal:     Cervical back: Neck supple.     Right lower leg: No edema.     Left lower leg: No edema.  Skin:    General: Skin is warm and dry.     Coloration: Skin is not jaundiced.  Neurological:     General: No focal deficit present.     Mental Status: She is alert. Mental status is at baseline.  Psychiatric:        Attention and Perception: She is inattentive.        Mood and Affect: Mood is  anxious.        Behavior: Behavior normal. Behavior is cooperative.        Cognition and Memory: Cognition is impaired. She exhibits impaired recent memory.     Comments: Oriented x 2.  Able to answer simple questions.     Data Reviewed:  Results are pending, will review when available.  12/05/2023 transthoracic echocardiogram report. IMPRESSIONS    1. Small area of inferior basal akinesis . Left ventricular ejection fraction, by estimation, is 55%. The left ventricle has normal function. The left ventricle demonstrates regional wall motion abnormalities (see scoring diagram/findings for description). Left ventricular diastolic parameters were normal.  2. Right ventricular systolic function is normal. The right ventricular size is normal.  3. Left atrial size was moderately dilated.  4. The mitral valve is abnormal. Mild mitral valve regurgitation. No evidence of mitral stenosis.  5. The aortic valve is tricuspid. There is mild calcification of the aortic valve. There is mild thickening of the aortic valve. Aortic valve regurgitation is moderate to severe. Aortic valve sclerosis is present, with no evidence of aortic valve stenosis.  6. The inferior vena cava is dilated in size with >50% respiratory variability, suggesting right atrial pressure of 8 mmHg.   12/08/2023 left heart cath and coronary angiography.  Conclusion    1st Mrg lesion is 40% stenosed.   Ost Cx to Prox Cx lesion is 15% stenosed.   Prox RCA lesion is 20% stenosed.   RPAV lesion is 65% stenosed.   LV end diastolic pressure is normal.   There is no aortic valve stenosis.   In the absence of any other complications or medical issues, we expect the patient to be ready for discharge from a cath perspective on 12/09/2023.   Recommend Aspirin  81mg  daily for moderate CAD.   Conclusions: Mild to moderate coronary artery disease, as detailed below.  Most severe lesion is a 60-70% stenosis in distal rPAV, which  is too small for PCI.  Suspect elevated troponin is due to supply-demand mismatch. Normal left ventricular filling pressure (LVEDP 8 mmHg).   Recommendations: Continue medical therapy and risk factor modification to prevent progression of coronary artery disease. Maintain net even fluid balance.   [name], MD [facility]   EKG:  Vent. rate 100 BPM PR interval 149 ms QRS duration 91 ms QT/QTcB 449/580 ms P-R-T axes 77 60 67 Sinus tachycardia Probable left atrial enlargement Minimal ST depression, lateral leads Prolonged QT interval  Asessment and Plan: Principal Problem:   Sepsis secondary to UTI (HCC) Admit to telemetry/inpatient. Continue supplemental oxygen. Scheduled and as needed bronchodilators. Continue ceftriaxone  1 g IVPB daily. Follow-up urine culture and sensitivity. Follow-up blood culture and sensitivity. Follow-up CBC and chemistry in the morning.  Active Problems:   Chronic respiratory failure with hypoxia (HCC) Continue supplemental oxygen's. Continue scheduled and as needed bronchodilators.    Hypokalemia Replacing. Supplemented magnesium . Follow-up potassium level in AM.    Hyponatremia Uses furosemide . Recent poor nutrition. Continue IV fluids. Hold diuretic for now. Follow-up sodium in AM.    High anion gap metabolic acidosis Resolving. Awaiting follow-up LA and BHA Follow-up labs in AM.    Normocytic anemia Monitor hematocrit and hemoglobin.    Closed left humeral fracture Analgesics as needed. Orthopedic surgery has been consulted. I will follow their recommendations.     Advance Care Planning:   Code Status: Full Code   Consults: Orthopedic surgery Valerie Pizza, MD).  Family Communication:   Severity of Illness: The appropriate patient status for this patient is INPATIENT. Inpatient status is judged to be reasonable and necessary in order to provide the required intensity of service to ensure the patient's  safety. The patient's presenting symptoms, physical exam findings, and initial radiographic and laboratory data in the context of their chronic comorbidities is felt to place them at high risk for further clinical deterioration. Furthermore, it is not anticipated that the patient will be medically stable for discharge from the hospital within 2 midnights of admission.   * I certify that at the point of admission it is my clinical judgment that the patient will require inpatient hospital care spanning beyond 2 midnights from the point of admission due to high intensity of service, high risk for further deterioration and high frequency of surveillance required.*  Author: Alm Dorn Castor, MD 01/26/2024 1:25 PM  For on call review [email or web address].   This document was prepared using Dragon voice recognition software and may contain some unintended transcription errors.     [1]  Allergies Allergen Reactions   Amoxicillin Rash   Vancomycin Itching    Erythema and mild itching -  Resolved with IV Benadryl .   Erythromycin Base Hives and Other (See Comments)    Pt states it makes her shaky.   "

## 2024-01-26 NOTE — ED Provider Notes (Signed)
 " [place] [facility] AT [facility] Provider Note   CSN: [id number] Arrival date & time: 01/26/24  9368     Patient presents with: Wendy [name]   PERCILLA TWETEN is a 69 y.o. female who presents to the emergency department with a chief complaint of fall.  Patient is chronically ill and has had a recent heart attack, as well as stroke per her son Wendy [name].  They are attempting to set up hospice care for her.  Approximately 4 days ago patient had a mechanical fall and fell and broke her left proximal humerus.  Patient is currently in a sling.  Patient had a fall at home and was down for an unknown amount of time.  Very difficult history to obtain, per EMS patient also noted to have a left-sided facial droop.  Last known well per son Alyce appears to be approximately 10:30 PM yesterday.  Patient is not on blood thinning medication.  Denies visual disturbances.  Son Alyce states that patient has had slower speech and mental decline ever since her previous stroke, and states that this has been present for the last few months.  Past medical history significant for chronic hepatitis C, HSV, headaches, DVT, rectal cancer, QT prolongation, pulmonary edema, shock, NSTEMI, rectal cancer, alcohol dependency, etc.    Fall       Prior to Admission medications  Medication Sig Start Date End Date Taking? Authorizing Provider  acetaminophen  (TYLENOL ) 325 MG tablet Take 650 mg by mouth every 6 (six) hours as needed for mild pain (pain score 1-3) or headache.   Yes [provider]  albuterol  (VENTOLIN  HFA) 108 (90 Base) MCG/ACT inhaler Inhale 2 puffs into the lungs every 6 (six) hours as needed for wheezing or shortness of breath. 11/17/23  Yes Dennise Lavada POUR, MD  aspirin  EC 81 MG tablet Take 1 tablet (81 mg total) by mouth daily. Swallow whole. 11/17/23  Yes Dennise Lavada POUR, MD  furosemide  (LASIX ) 20 MG tablet Take 1 tablet (20 mg total) by mouth daily. 12/29/23  Yes   lisinopril   (ZESTRIL ) 5 MG tablet Take 5 mg by mouth daily. 12/24/23  Yes [provider]  Morphine  Sulfate, Concentrate, 5 MG/0.25ML SOLN Take 0.25 mLs by mouth every 6 (six) hours as needed (Pain).   Yes [provider]  oxyCODONE  (ROXICODONE ) 5 MG immediate release tablet Take 1 tablet (5 mg total) by mouth every 6 (six) hours as needed for up to 5 days for severe pain (pain score 7-10) or breakthrough pain. 01/22/24 01/27/24 Yes Rogelia Jerilynn RAMAN, MD  pantoprazole  (PROTONIX ) 40 MG tablet Take 1 tablet (40 mg total) by mouth daily. 12/29/23  Yes   tiZANidine  (ZANAFLEX ) 4 MG tablet Take 1 tablet (4 mg total) by mouth every 8 (eight) hours as needed for muscle spasms. 01/22/24  Yes Rogelia Jerilynn RAMAN, MD  furosemide  (LASIX ) 40 MG tablet Take 1 tablet (40 mg total) by mouth daily. Patient not taking: Reported on 01/26/2024 12/10/23   Vernon Ranks, MD  lisinopril  (ZESTRIL ) 10 MG tablet Take 1 tablet (10 mg total) by mouth daily. Patient not taking: Reported on 01/26/2024 12/10/23 01/09/24  Vernon Ranks, MD  rosuvastatin  (CRESTOR ) 20 MG tablet Take 1 tablet (20 mg total) by mouth daily. Patient not taking: Reported on 01/26/2024 12/10/23   Vernon Ranks, MD    Allergies: Amoxicillin, Vancomycin, and Erythromycin base    Review of Systems  Musculoskeletal:  Positive for arthralgias.    Updated Vital Signs BP ROLLEN)  164/81   Pulse 99   Temp 98 F (36.7 C)   Resp 17   LMP 01/03/1995   SpO2 96%   Physical Exam Vitals and nursing note reviewed.  Constitutional:      General: She is awake. She is not in acute distress.    Appearance: She is ill-appearing. She is not toxic-appearing or diaphoretic.     Comments: Left upper extremity in a sling  HENT:     Head: Normocephalic and atraumatic.     Comments: No tenderness with palpation of facial bones or scalp, no raccoon eyes, no Battle sign, mild left sided facial droop Eyes:     General: No scleral icterus.    Pupils: Pupils are equal,  round, and reactive to light.     Comments: Patient was unable to comply with EOM exam, vision grossly intact  Neck:     Comments: No cervical spine tenderness, no step-off deformity of the cervical spine Cardiovascular:     Rate and Rhythm: Normal rate and regular rhythm.  Pulmonary:     Effort: Pulmonary effort is normal. No respiratory distress.  Abdominal:     General: Abdomen is flat.     Palpations: Abdomen is soft.  Musculoskeletal:     Cervical back: Normal range of motion.     Right lower leg: No edema.     Left lower leg: No edema.     Comments: Patient has pain with palpation of proximal left humerus and left shoulder, also mild tenderness to palpation of left elbow, no pain with palpation of distal left forearm, left wrist, left hand, no tenderness of RUE  Equal grip strength bilaterally, patient able to plantar and dorsiflex against resistance as well as gravity  Pelvis feels stable, negative log roll test of lower extremities bilaterally   Skin:    General: Skin is warm.     Capillary Refill: Capillary refill takes less than 2 seconds.     Comments: Significant bruising present to left upper extremity extending from left shoulder all the way down past left elbow  Neurological:     General: No focal deficit present.     Mental Status: She is alert and oriented to person, place, and time.     Coordination: Coordination normal.     Comments: Patient slow with mentation which may be baseline per son, patient was conversational and able to follow commands throughout history and physical, left sided facial droop noted, patient did have some mild difficulty with finger to nose testing however was able to go from her nose to my finger the majority of the time  Psychiatric:        Mood and Affect: Mood normal.        Behavior: Behavior normal. Behavior is cooperative.     (all labs ordered are listed, but only abnormal results are displayed) Labs Reviewed  CBC WITH  DIFFERENTIAL/PLATELET - Abnormal; Notable for the following components:      Result Value   WBC 16.3 (*)    RBC 2.92 (*)    Hemoglobin 9.5 (*)    HCT 28.7 (*)    Neutro Abs 15.1 (*)    Lymphs Abs 0.4 (*)    All other components within normal limits  COMPREHENSIVE METABOLIC PANEL WITH GFR - Abnormal; Notable for the following components:   Sodium 130 (*)    Potassium 2.8 (*)    Chloride 88 (*)    CO2 17 (*)    AST 94 (*)  ALT 51 (*)    Total Bilirubin 1.8 (*)    Anion gap 25 (*)    All other components within normal limits  URINALYSIS, ROUTINE W REFLEX MICROSCOPIC - Abnormal; Notable for the following components:   Hgb urine dipstick MODERATE (*)    Ketones, ur 5 (*)    Leukocytes,Ua SMALL (*)    Bacteria, UA RARE (*)    All other components within normal limits  URINE DRUG SCREEN - Abnormal; Notable for the following components:   Opiates POSITIVE (*)    Tetrahydrocannabinol POSITIVE (*)    All other components within normal limits  APTT - Abnormal; Notable for the following components:   aPTT 44 (*)    All other components within normal limits  LACTIC ACID, PLASMA - Abnormal; Notable for the following components:   Lactic Acid, Venous 2.3 (*)    All other components within normal limits  CBG MONITORING, ED - Abnormal; Notable for the following components:   Glucose-Capillary 100 (*)    All other components within normal limits  CULTURE, BLOOD (ROUTINE X 2)  CULTURE, BLOOD (ROUTINE X 2)  URINE CULTURE  ETHANOL  PROTIME-INR  MAGNESIUM   LACTIC ACID, PLASMA  BLOOD GAS, VENOUS  BETA-HYDROXYBUTYRIC ACID  I-STAT CHEM 8, ED  POC OCCULT BLOOD, ED    EKG: EKG Interpretation Date/Time:  Monday January 26 2024 07:45:46 EST Ventricular Rate:  100 PR Interval:  149 QRS Duration:  91 QT Interval:  449 QTC Calculation: 580 R Axis:   60  Text Interpretation: Sinus tachycardia Probable left atrial enlargement Minimal ST depression, lateral leads Prolonged QT interval  Confirmed by Freddi Hamilton [phone]) on 01/26/2024 8:38:33 AM  Radiology: [facility] Shoulder 1V Left Result Date: 01/26/2024 EXAM: 1 VIEW XRAY OF THE LEFT SHOULDER 01/26/2024 10:38:00 AM COMPARISON: Left humerus series 08/03/2024 07:21:00 PM. CLINICAL HISTORY: 69 year old female. Proximal humerus fracture. Found down on floor. FINDINGS: BONES AND JOINTS: Acute displaced fracture of the proximal humerus with mild apex anterior angulation. The left humeral head fragment remains normally aligned with the left glenoid. SOFT TISSUES: Visualized lung is unremarkable. IMPRESSION: 1. Acute displaced left proximal humerus fracture with Left glenohumeral alignment maintained. Electronically signed by: Helayne Hurst MD MD 01/26/2024 11:08 AM EST RP Workstation: [id number]   CT CERVICAL SPINE WO CONTRAST Result Date: 01/26/2024 EXAM: CT CERVICAL SPINE WITHOUT CONTRAST 01/26/2024 10:45:29 AM TECHNIQUE: CT of the cervical spine was performed WITHOUT the administration of intravenous contrast. Multiplanar reformatted images are provided for review. Automated exposure control, iterative reconstruction, and/or weight based adjustment of the mA/kV was utilized to reduce the radiation dose to as low as reasonably achievable. COMPARISON: CT cervical spine 01/22/2024. Head CT reported separately today. CLINICAL HISTORY: 69 year old female. Fall. Found down on floor. FINDINGS: BONES AND ALIGNMENT: Osteopenia appears age advanced with superimposed confluent and degenerative appearing endplate and vertebral body sclerosis in the mid and lower cervical spine. Stable alignment with degenerative anterolisthesis of C3 on C4 and degenerative left facet ankylosis at that level. No acute fracture or traumatic malalignment. DEGENERATIVE CHANGES: Chronic severe disc and endplate degeneration C3-C4 through C6-C7. Vacuum disc at C5-C6. Degenerative cervical spinal stenosis suspected and likely severe at the C5-C6 level (series 13 image 58). SOFT  TISSUES: Bulky multifocal calcified cervical carotid artery atherosclerosis. Otherwise negative visible non-contrast neck soft tissues. No prevertebral soft tissue swelling. LUNGS: Emphysema and mild scarring in the lung apices. IMPRESSION: 1. No acute traumatic injury identified in the cervical spine. 2. Very advanced cervical spine degeneration with  evidence of SEVERE multifactorial cervical spinal stenosis at C5-C6. 3. Emphysema. Electronically signed by: Helayne Hurst MD 01/26/2024 11:06 AM EST RP Workstation: [id number]   CT HEAD WO CONTRAST ( ) Result Date: 01/26/2024 EXAM: CT HEAD WITHOUT CONTRAST 01/26/2024 10:45:29 AM TECHNIQUE: CT of the head was performed without the administration of intravenous contrast. Automated exposure control, iterative reconstruction, and/or weight based adjustment of the mA/kV was utilized to reduce the radiation dose to as low as reasonably achievable. COMPARISON: Head CT 01/22/2024. CLINICAL HISTORY: 69 year old female. Left-sided facial droop, possible stroke-like symptoms. Found down on floor. FINDINGS: BRAIN AND VENTRICLES: No acute hemorrhage. No evidence of acute infarct. No hydrocephalus. No extra-axial collection. No mass effect or midline shift. Brain volume stable and at the lower limits of normal for age. Moderate patchy bilateral cerebral white matter hypodensity is stable. Calcified atherosclerosis at the skull base. No suspicious intracranial vascular hyperdensity. ORBITS: No acute abnormality. SINUSES: Paranasal sinuses remain well aerated. SOFT TISSUES AND SKULL: No acute soft tissue abnormality. No skull fracture. Tympanic cavities and mastoids remain well aerated. IMPRESSION: 1. No acute traumatic injury or intracranial abnormality identified. 2. Stable moderately advanced cerebral white matter disease. Electronically signed by: Helayne Hurst MD MD 01/26/2024 11:02 AM EST RP Workstation: [id number]   DG Elbow 2 Views Left Result Date: 01/26/2024 EXAM: 1 or  2 VIEW(S) XRAY OF THE LEFT ELBOW COMPARISON: 01/22/2024. CLINICAL HISTORY: fall, concern for recurrent injury FINDINGS: LIMITATIONS: Limited evaluation due to positioning. BONES AND JOINTS: Possible anterior joint effusion. Equivocal cortical defect along the radial head. Differential diagnosis for the cortical defect includes a non-displaced fracture, an old injury, or a normal variant. Follow-up imaging or clinical correlation is recommended. No malalignment. SOFT TISSUES: Unremarkable. IMPRESSION: 1. Limited evaluation due to positioning. 2. Possible anterior joint effusion with equivocal cortical defect along the radial head; recommend immobilization and follow-up radiographs in 7-10 days. Electronically signed by: Waddell Calk MD MD 01/26/2024 07:52 AM EST RP Workstation: [id number]   DG Shoulder Left Portable Result Date: 01/26/2024 EXAM: 1 VIEW(S) XRAY OF THE LEFT SHOULDER 01/26/2024 07:40:00 AM COMPARISON: 01/22/2024 CLINICAL HISTORY: known fracture, recurrent fall FINDINGS: BONES AND JOINTS: Comminuted fracture of the left humeral neck and greater tuberosity with apex lateral angulation of 60 degrees. Increased proximal displacement of the distal fracture fragments relative to the humeral head. [facility] joint is unremarkable. SOFT TISSUES: Diffuse soft tissue swelling. No abnormal calcifications. Visualized lung is unremarkable. IMPRESSION: 1. Comminuted fracture of the left humeral neck and greater tuberosity with apex lateral angulation of 60 degrees and increased proximal displacement of the distal fracture fragments relative to the humeral head, compared to 01/22/2024. 2. Diffuse soft tissue swelling. Electronically signed by: Waddell Calk MD MD 01/26/2024 07:48 AM EST RP Workstation: [id number]   DG Chest Portable 1 View Result Date: 01/26/2024 EXAM: 1 VIEW(S) XRAY OF THE CHEST 01/26/2024 07:40:00 AM COMPARISON: 12/06/2023 CLINICAL HISTORY: fall FINDINGS: LUNGS AND PLEURA: No focal pulmonary  opacity. No pleural effusion. No pneumothorax. HEART AND MEDIASTINUM: Aortic calcification. No acute abnormality of the cardiac and mediastinal silhouettes. BONES AND SOFT TISSUES: Acute displaced fracture of surgical neck of left proximal humerus. IMPRESSION: 1. Acute displaced fracture of the surgical neck of the left proximal humerus. 2. No acute cardiopulmonary findings. Electronically signed by: Waddell Calk MD MD 01/26/2024 07:45 AM EST RP Workstation: [id number]   DG Pelvis Portable Result Date: 01/26/2024 EXAM: 1 OR 2 VIEW(S) XRAY OF THE PELVIS 01/26/2024 07:40:00 AM COMPARISON: 01/22/2024 CLINICAL HISTORY: fall FINDINGS: BONES AND JOINTS:  No acute fracture. No malalignment. SOFT TISSUES: Atherosclerotic plaque. IMPRESSION: 1. No evidence of acute traumatic injury. Electronically signed by: Waddell Calk MD MD 01/26/2024 07:44 AM EST RP Workstation: [id number]     Procedures   Medications Ordered in the ED  0.9 % NaCl with KCl 20 mEq/ L  infusion ( Intravenous New Bag/Given 01/26/24 1521)  acetaminophen  (TYLENOL ) tablet 650 mg (has no administration in time range)    Or  acetaminophen  (TYLENOL ) suppository 650 mg (has no administration in time range)  prochlorperazine  (COMPAZINE ) injection 5 mg (has no administration in time range)  iohexol  (OMNIPAQUE ) 350 MG/ML injection 75 mL (75 mLs Intravenous Contrast Given 01/26/24 0848)  sodium chloride  0.9 % bolus 500 mL (0 mLs Intravenous Stopped 01/26/24 1028)  potassium chloride  10 mEq in 100 mL IVPB (0 mEq Intravenous Stopped 01/26/24 0946)  potassium chloride  (KLOR-CON ) packet 40 mEq (40 mEq Oral Given 01/26/24 1030)  cefTRIAXone  (ROCEPHIN ) 1 g in sodium chloride  0.9 % 100 mL IVPB (0 g Intravenous Stopped 01/26/24 1036)  sodium chloride  0.9 % bolus 1,000 mL (0 mLs Intravenous Stopped 01/26/24 1155)  morphine  (PF) 2 MG/ML injection 1 mg (1 mg Intravenous Given 01/26/24 1008)  diphenhydrAMINE  (BENADRYL ) injection 12.5 mg (12.5 mg Intravenous Given  01/26/24 1006)  magnesium  sulfate IVPB 2 g 50 mL (0 g Intravenous Stopped 01/26/24 1449)  sodium chloride  0.9 % bolus 500 mL (0 mLs Intravenous Stopped 01/26/24 1527)    Clinical Course as of 01/26/24 1548  Mon Jan 26, 2024  0743 Humeral fracture worse on xray?  [CH]  [phone] Talk with ortho, let Dr. Teresa know that patient will be admitted  [CH]  0955 Negative hemmocult [CH]    Clinical Course User Index [CH] Danna Casella, [place] FALCON, PA-C                                 Medical Decision Making Amount and/or Complexity of Data Reviewed Labs: ordered. Radiology: ordered.  Risk Prescription drug management. Decision regarding hospitalization.   Patient presents to the ED for concern of fall, this involves an extensive number of treatment options, and is a complaint that carries with it a high risk of complications and morbidity.  The differential diagnosis includes fracture, sepsis, skull fracture, neck fracture, brain bleed, medication interaction, worsening of baseline mental status, stroke, TIA, electrolyte abnormality, etc.   Co morbidities that complicate the patient evaluation  chronic hepatitis C, HSV, headaches, DVT, rectal cancer, QT prolongation, pulmonary edema, shock, NSTEMI, rectal cancer, alcohol dependency   Additional history obtained:  Patient history obtained from son Wendy [name] over the phone, confirms last known well of 10:30pm    Lab Tests:  I Ordered, and personally interpreted labs.  The pertinent results include: CBC significant for leukocytosis to 16.3, hemoglobin 9.5 down from around 11 previously, hyponatremia, hypokalemia, hypochloremia, decreased bicarb, mild transaminitis, total bili 1.8, anion gap 25, CBG 100, magnesium  1.7, lactic acid 2.3, urinalysis possibly consistent with infection given moderate hemoglobin, small leuks, rare bacteria, urine culture in progress, UDS positive for opiates and THC, ethanol less than 15   Imaging Studies ordered:  I ordered  imaging studies including x-ray left shoulder, CT head, CT cervical spine, x-ray left shoulder and elbow, chest x-ray, pelvis I independently visualized and interpreted imaging which showed: CT head:  No acute intracranial abnormality, stable chronic white matter disease CT cervical spine: No evidence of acute trauma, very advanced cervical spine degeneration with  evidence of severe multifactorial cervical spinal stenosis at C5-C6, emphysema I agree with the radiologist interpretation   Cardiac Monitoring:  The patient was maintained on a cardiac monitor.  I personally viewed and interpreted the cardiac monitored which showed an underlying rhythm of: Sinus tachycardia   Medicines ordered and prescription drug management:  I ordered medication including fluids, potassium, ceftriaxone , morphine , Benadryl  for hypokalemia, possible sepsis, UTI, pain, skin itching Reevaluation of the patient after these medicines showed that the patient improved I have reviewed the patients home medicines and have made adjustments as needed   Test Considered:  CTA head and neck: Initially ordered CTA head and neck due to possibility of stroke given left-sided facial droop however last known well 10:30 PM yesterday, patient was very difficult to gain access and did not meet Code stroke criteria given exam and history, spoke with attending who recommended admission and holding on CTA head and neck at this time and possible MRI inpatient if needed   Critical Interventions:  Critical care time given difficult history, significant lab abnormalities    Consultations Obtained:  I requested consultation with the hospitalist,  and discussed lab and imaging findings as well as pertinent plan - they recommend: admission for ongoing diagnosis and treatment, also spoke with Dr. Teresa with ortho who's service will see the patient    Problem List / ED Course:  69 year old female, vital signs stable, presents to the  emergency department for a chief complaint of fall.  This was a very difficult history to obtain, I spoke with the patient's son Wendy [name] on the phone who states that last night at approximately 10:30pm the patient was at her baseline, he then found the patient on the floor this morning after a fall, patient is AxO x 4 with very slow mentation  On physical exam patient is alert and oriented however mentation is very slow, patient's son Wendy [name] states that this has been the baseline for her the last few months as her physical and mental health have significantly deteriorated, there is extensive bruising present to her left arm over the humerus as well as elbow, patient has a known surgical neck fracture of her left humerus from when she fell approximately 4 days ago, possibly very mild left sided facial droop present however coordination and vision grossly intact, equal grip strength bilaterally, good strength in bilateral lower extremities, no evidence of findings that would meet code stroke criteria, patient also has history of previous stroke per son Patient has been self-medicating with CBD Gummies at home and was recently transitioned to p.o. morphine  instead of p.o. oxycodone , denies other alcohol or drug use Will obtain baseline labs to rule out the possibility of sepsis, will also obtain imaging of head, neck, left shoulder, left elbow, chest, as well as pelvis Spoke with attending Dr. Freddi who evaluated the patient at bedside, agrees with current workup and plan for admission  Based off of imaging today, left humerus appears to be more displaced after fall, CT head shows no acute intracranial abnormality but does show increasing white matter disease however stable, CT cervical spine shows no acute trauma however does show severe degenerative disc disease and severe foraminal stenosis Extensive lab abnormalities present including leukocytosis, worsening anemia, hyponatremia, hypokalemia, hypochloremia,  and elevated anion gap Patient vital signs however are stable, patient supplemented with fluids, IV antibiotics for possible urinary tract infection, potassium for hypokalemia, morphine  for pain Spoke with hospitalist Dr. Celinda who recommended lactic, VBG, beta hydroxybutyric acid, will obtain  these labs and admit patient Ultimately admitted to the hospital Unknown diagnosis for mental status changes at this time however could be worsening baseline mental status, stroke (out of the stroke window), due to significant electrolyte abnormalities, sepsis from a urinary tract infection, medication interactions or use, drug use, or other cause, it does appear that the fracture of the surgical neck of her left humerus has become more angulated after the most recent fall and Ortho will see inpatient   Reevaluation:  After the interventions noted above, I reevaluated the patient and found that they have :stayed the same   Social Determinants of Health:  - Patient is chronically ill   Dispostion:  After consideration of the diagnostic results and the patients response to treatment, I feel that the patient would benefit from admission the hospital for ongoing diagnosis and treatment.       Final diagnoses:  Fall, initial encounter  Closed fracture of proximal end of left humerus with malunion, unspecified fracture morphology, subsequent encounter  Urinary tract infection with hematuria, site unspecified  Hyponatremia  Hypokalemia  Acidosis    ED Discharge Orders     None          Janetta Terrall FALCON, [place] 01/26/24 1548  "

## 2024-01-27 DIAGNOSIS — N390 Urinary tract infection, site not specified: Secondary | ICD-10-CM | POA: Diagnosis not present

## 2024-01-27 DIAGNOSIS — A419 Sepsis, unspecified organism: Secondary | ICD-10-CM | POA: Diagnosis not present

## 2024-01-27 LAB — COMPREHENSIVE METABOLIC PANEL WITH GFR
ALT: 39 U/L (ref 0–44)
AST: 88 U/L — ABNORMAL HIGH (ref 15–41)
Albumin: 3.1 g/dL — ABNORMAL LOW (ref 3.5–5.0)
Alkaline Phosphatase: 88 U/L (ref 38–126)
Anion gap: 18 — ABNORMAL HIGH (ref 5–15)
BUN: 9 mg/dL (ref 8–23)
CO2: 19 mmol/L — ABNORMAL LOW (ref 22–32)
Calcium: 8.6 mg/dL — ABNORMAL LOW (ref 8.9–10.3)
Chloride: 100 mmol/L (ref 98–111)
Creatinine, Ser: 0.51 mg/dL (ref 0.44–1.00)
GFR, Estimated: >60 mL/min
Glucose, Bld: 92 mg/dL (ref 70–99)
Potassium: 2.8 mmol/L — ABNORMAL LOW (ref 3.5–5.1)
Sodium: 136 mmol/L (ref 135–145)
Total Bilirubin: 0.9 mg/dL (ref 0.0–1.2)
Total Protein: 5.7 g/dL — ABNORMAL LOW (ref 6.5–8.1)

## 2024-01-27 LAB — CBC WITH DIFFERENTIAL/PLATELET
Abs Immature Granulocytes: 0.02 K/uL (ref 0.00–0.07)
Basophils Absolute: 0 K/uL (ref 0.0–0.1)
Basophils Relative: 0 %
Eosinophils Absolute: 0 K/uL (ref 0.0–0.5)
Eosinophils Relative: 0 %
HCT: 27.7 % — ABNORMAL LOW (ref 36.0–46.0)
Hemoglobin: 9.2 g/dL — ABNORMAL LOW (ref 12.0–15.0)
Immature Granulocytes: 0 %
Lymphocytes Relative: 7 %
Lymphs Abs: 0.5 K/uL — ABNORMAL LOW (ref 0.7–4.0)
MCH: 32.1 pg (ref 26.0–34.0)
MCHC: 33.2 g/dL (ref 30.0–36.0)
MCV: 96.5 fL (ref 80.0–100.0)
Monocytes Absolute: 0.5 K/uL (ref 0.1–1.0)
Monocytes Relative: 8 %
Neutro Abs: 5.3 K/uL (ref 1.7–7.7)
Neutrophils Relative %: 85 %
Platelets: 320 K/uL (ref 150–400)
RBC: 2.87 MIL/uL — ABNORMAL LOW (ref 3.87–5.11)
RDW: 15.7 % — ABNORMAL HIGH (ref 11.5–15.5)
Smear Review: NORMAL
WBC: 6.2 K/uL (ref 4.0–10.5)
nRBC: 0 % (ref 0.0–0.2)

## 2024-01-27 LAB — URINE CULTURE: Culture: NO GROWTH

## 2024-01-27 LAB — MAGNESIUM: Magnesium: 1.9 mg/dL (ref 1.7–2.4)

## 2024-01-27 MED ORDER — POTASSIUM CHLORIDE 10 MEQ/100ML IV SOLN
10.0000 meq | INTRAVENOUS | Status: DC
  Administered 2024-01-27: 10 meq via INTRAVENOUS
  Filled 2024-01-27: qty 100

## 2024-01-27 MED ORDER — POTASSIUM CHLORIDE CRYS ER 20 MEQ PO TBCR
40.0000 meq | EXTENDED_RELEASE_TABLET | ORAL | Status: DC
  Filled 2024-01-27: qty 2

## 2024-01-27 MED ORDER — POTASSIUM CHLORIDE IN NACL 20-0.9 MEQ/L-% IV SOLN
INTRAVENOUS | Status: AC
  Filled 2024-01-27 (×2): qty 1000

## 2024-01-27 MED ORDER — POTASSIUM CHLORIDE 10 MEQ/100ML IV SOLN
10.0000 meq | INTRAVENOUS | Status: AC
  Administered 2024-01-27 (×5): 10 meq via INTRAVENOUS
  Filled 2024-01-27 (×5): qty 100

## 2024-01-27 NOTE — Evaluation (Addendum)
 Physical Therapy Evaluation Patient Details Name: Wendy [name] MRN: [id number] DOB: 03-21-1955 Today's Date: 01/27/2024  History of Present Illness  69 y.o. female who was seen in the emergency department 4 days prior to admission due to having a mechanical fall landing on her left side, fracturing her proximal humerus who was brought to the emergency department via EMS 01/26/23 after being found on the floor. Dx sepsis, UTI. Pt with medical history significant of rectal adenocarcinoma alcohol dependency, seasonal allergies, anxiety, chronic hep C, HSV-1 and HSV-2, history of DVT of the right tubular vein, hypertension, migraine headaches, history of TIA/CVA  Clinical Impression  Pt admitted with above diagnosis. Pt reports h/o at least 3 falls in the past 6 months, her falls have all been posterior. While standing at edge of bed this session pt suddenly sat down and had complete loss of balance posteriorly (like a tree falling) with no loss of consciousness, with no protective reactions. OT reported a similar event during evaluation earlier today. Multiple episodes of sudden posterior loss of balance are of concern and would warrant further work up. With minimum hand held assist she was steady in standing and was able to walk 80'. Due to poor balance and h/o falls, hands on assist is recommended for all mobility. Patient will benefit from continued inpatient follow up therapy, <3 hours/day. Her son lives with her but he works and cannot provide 24/7 care.  Pt currently with functional limitations due to the deficits listed below (see PT Problem List). Pt will benefit from acute skilled PT to increase their independence and safety with mobility to allow discharge.           If plan is discharge home, recommend the following: A little help with walking and/or transfers;A little help with bathing/dressing/bathroom;Assistance with cooking/housework;Assist for transportation;Help with stairs or ramp for  entrance   Can travel by private vehicle   Yes    Equipment Recommendations Cane  Recommendations for Other Services       Functional Status Assessment Patient has had a recent decline in their functional status and demonstrates the ability to make significant improvements in function in a reasonable and predictable amount of time.     Precautions / Restrictions Precautions Precautions: Fall Recall of Precautions/Restrictions: Impaired Precaution/Restrictions Comments: h/o multiple falls backwards Required Braces or Orthoses: Sling Restrictions Weight Bearing Restrictions Per Provider Order: Yes LUE Weight Bearing Per Provider Order: Non weight bearing      Mobility  Bed Mobility Overal bed mobility: Needs Assistance Bed Mobility: Supine to Sit, Sit to Supine     Supine to sit: Supervision, HOB elevated Sit to supine: Min assist   General bed mobility comments: used R rail to pull up; min A for LEs into bed    Transfers Overall transfer level: Needs assistance Equipment used: 1 person hand held assist Transfers: Sit to/from Stand Sit to Stand: Min assist           General transfer comment: after standing at edge of bed for ~5 seconds pt suddenly sat down and had a posterior loss of balance in which she completely laid back on the bed, no protective reactions, pt reports this has been happening at home    Ambulation/Gait Ambulation/Gait assistance: Min assist Gait Distance (Feet): 80 Feet Assistive device: 1 person hand held assist Gait Pattern/deviations: Step-through pattern, Decreased stride length, Drifts right/left Gait velocity: decr     General Gait Details: steady with min hand held assist  Stairs  Wheelchair Mobility     Tilt Bed    Modified Rankin (Stroke Patients Only)       Balance Overall balance assessment: Needs assistance Sitting-balance support: No upper extremity supported, Feet supported Sitting balance-Leahy  Scale: Fair     Standing balance support: Single extremity supported Standing balance-Leahy Scale: Poor Standing balance comment: had 1 episode of sudden complete loss of balance posteriorly while standing edge of bed without UE support with no observed protective responses, with min hand held assist in standing pt was steady                             Pertinent Vitals/Pain Pain Assessment Pain Assessment: No/denies pain    Home Living Family/patient expects to be discharged to:: Private residence Living Arrangements: Children Available Help at Discharge: Family;Available PRN/intermittently Type of Home: Apartment Home Access: Stairs to enter Entrance Stairs-Rails: None Entrance Stairs-Number of Steps: 3   Home Layout: One level Home Equipment: [employer] (2 wheels);Cane - single point;Shower seat Additional Comments: lives with  son who works during the day    Prior Function Prior Level of Function : Independent/Modified Independent;History of Falls (last six months);Patient poor historian/Family not available             Mobility Comments: reports at least 3 recent falls, all posteriorly, in past 6 months. no AD for mobility ADLs Comments: Reports Mod I for ADLs including showers but also reports L hand doesnt work. unsure of accuracy     Extremity/Trunk Assessment   Upper Extremity Assessment Upper Extremity Assessment: Right hand dominant;LUE deficits/detail LUE Deficits / Details: L hand edematous, in sling readjusted by OT for optimal fit. Pt reports L hand does not work when asked to demo grasp/extension LUE Coordination: decreased fine motor    Lower Extremity Assessment Lower Extremity Assessment: Overall WFL for tasks assessed    Cervical / Trunk Assessment Cervical / Trunk Assessment: Normal  Communication   Communication Communication: Impaired Factors Affecting Communication: Other (comment);Reduced clarity of speech;Difficulty  expressing self (soft spoken, reports she involuntarily shakes her head no even when she means yes so go by her words not her head gestures)    Cognition Arousal: Alert Behavior During Therapy: WFL for tasks assessed/performed, Impulsive                             Following commands: Impaired Following commands impaired: Follows one step commands with increased time     Cueing Cueing Techniques: Verbal cues, Gestural cues, Tactile cues     General Comments      Exercises     Assessment/Plan    PT Assessment Patient needs continued PT services  PT Problem List Decreased balance;Decreased activity tolerance;Decreased mobility;Decreased safety awareness       PT Treatment Interventions Therapeutic activities;Therapeutic exercise;Functional mobility training;Patient/family education    PT Goals (Current goals can be found in the Care Plan section)  Acute Rehab PT Goals Patient Stated Goal: to get 24* care PT Goal Formulation: With patient/family Time For Goal Achievement: 02/10/24 Potential to Achieve Goals: Good    Frequency Min 2X/week     Co-evaluation               AM-PAC PT 6 Clicks Mobility  Outcome Measure Help needed turning from your back to your side while in a flat bed without using bedrails?: A Little Help needed moving from lying  on your back to sitting on the side of a flat bed without using bedrails?: A Little Help needed moving to and from a bed to a chair (including a wheelchair)?: A Little Help needed standing up from a chair using your arms (e.g., wheelchair or bedside chair)?: A Little Help needed to walk in hospital room?: A Lot Help needed climbing 3-5 steps with a railing? : A Lot 6 Click Score: 16    End of Session Equipment Utilized During Treatment: Gait belt Activity Tolerance: Patient tolerated treatment well Patient left: in bed;with bed alarm set;with family/visitor present;with call bell/phone within  reach Nurse Communication: Mobility status PT Visit Diagnosis: Difficulty in walking, not elsewhere classified (R26.2);Repeated falls (R29.6);History of falling (Z91.81);Unsteadiness on feet (R26.81)    Time: 8967-8947 PT Time Calculation (min) (ACUTE ONLY): 20 min   Charges:   PT Evaluation $PT Eval Moderate Complexity: 1 Mod   PT General Charges $$ ACUTE PT VISIT: 1 Visit         Sylvan Delon Copp PT 01/27/2024  Acute Rehabilitation Services  Office [phone]

## 2024-01-27 NOTE — Evaluation (Addendum)
 Occupational Therapy Evaluation Patient Details Name: Wendy [name] MRN: [id number] DOB: 1955-05-26 Today's Date: 01/27/2024   History of Present Illness   69 y.o. female who was seen in the emergency department 4 days prior to admission due to having a mechanical fall landing on her left side, fracturing her proximal humerus who was brought to the emergency department via EMS 01/26/23 after being found on the floor. Dx sepsis, UTI. Pt with medical history significant of rectal adenocarcinoma alcohol dependency, seasonal allergies, anxiety, chronic hep C, HSV-1 and HSV-2, history of DVT of the right tubular vein, hypertension, migraine headaches, history of TIA/CVA     Clinical Impressions PTA, pt lives with son who works during the day, reports [employer] with ADLs and mobility without need for AD in the home. Pt confirms recent falls at home. Pt presents now with deficits in standing balance, strength, endurance and cognition. Pt requires Min A for mobility in room w/ handheld assist, up to Max A for UB/LB ADLs d/t inability to use L nondominant UE. Pt does report impaired use of L hand at baseline but required significant cues/assist for one handed ADL techniques today. Pt also required up to Mod A to correct a posterior LOB while standing at sink. Based on decreased caregiver support during the day and high risk for continued falls, recommend continued inpatient follow up therapy, <3 hours/day at DC.  HR 135bpm with activity     If plan is discharge home, recommend the following:   A lot of help with walking and/or transfers;A lot of help with bathing/dressing/bathroom     Functional Status Assessment   Patient has had a recent decline in their functional status and demonstrates the ability to make significant improvements in function in a reasonable and predictable amount of time.     Equipment Recommendations   BSC/3in1     Recommendations for Other Services          Precautions/Restrictions   Precautions Precautions: Fall Recall of Precautions/Restrictions: Impaired Required Braces or Orthoses: Sling Restrictions Weight Bearing Restrictions Per Provider Order: Yes LUE Weight Bearing Per Provider Order: Non weight bearing     Mobility Bed Mobility Overal bed mobility: Needs Assistance Bed Mobility: Supine to Sit     Supine to sit: Supervision, HOB elevated          Transfers Overall transfer level: Needs assistance Equipment used: 1 person hand held assist Transfers: Sit to/from Stand Sit to Stand: Min assist                  Balance Overall balance assessment: Needs assistance Sitting-balance support: No upper extremity supported, Feet supported Sitting balance-Leahy Scale: Fair     Standing balance support: No upper extremity supported, Single extremity supported Standing balance-Leahy Scale: Poor                             ADL either performed or assessed with clinical judgement   ADL Overall ADL's : Needs assistance/impaired Eating/Feeding: Set up;Sitting   Grooming: Standing;Oral care;Wash/dry face;Moderate assistance Grooming Details (indicate cue type and reason): Assist to stabilize dentures while brushing with R UE, assist to place toothpaste on toothbrush. Pt with one posterior LOB standing without support requiring Mod A to correct and prevent fall Upper Body Bathing: Maximal assistance;Sitting   Lower Body Bathing: Moderate assistance;Sit to/from stand;Sitting/lateral leans   Upper Body Dressing : Maximal assistance;Sitting   Lower Body Dressing: Moderate assistance;Sit to/from  stand;Sitting/lateral leans Lower Body Dressing Details (indicate cue type and reason): assist to don socks over toes with pt then able to cross LE to assist in pulling up with one UE Toilet Transfer: Minimal assistance;Ambulation;Regular Toilet   Toileting- Clothing Manipulation and Hygiene: Minimal  assistance;Moderate assistance;Sitting/lateral lean;Sit to/from stand               Vision Baseline Vision/History: 1 Wears glasses Ability to See in Adequate Light: 0 Adequate Patient Visual Report: No change from baseline Vision Assessment?: No apparent visual deficits     Perception         Praxis         Pertinent Vitals/Pain Pain Assessment Pain Assessment: No/denies pain     Extremity/Trunk Assessment Upper Extremity Assessment Upper Extremity Assessment: Right hand dominant;LUE deficits/detail LUE Deficits / Details: L hand edematous, in sling readjusted by OT for optimal fit. Pt reports L hand does not work when asked to demo grasp/extension LUE Coordination: decreased fine motor   Lower Extremity Assessment Lower Extremity Assessment: Defer to PT evaluation   Cervical / Trunk Assessment Cervical / Trunk Assessment: Normal   Communication Communication Communication: Impaired Factors Affecting Communication: Other (comment) (soft spoken)   Cognition Arousal: Alert Behavior During Therapy: WFL for tasks assessed/performed, Impulsive Cognition: Cognition impaired     Awareness: Online awareness impaired   Attention impairment (select first level of impairment): Selective attention Executive functioning impairment (select all impairments): Sequencing, Reasoning, Problem solving OT - Cognition Comments: pleasant, reports recalling a fall off of couch at home. a bit impulsive with movement at times and benefits from cues to pace and wait for assist. cues for problem solving one handed tasks despite pt reporting L hand does not work at baseline.                 Following commands: Impaired Following commands impaired: Follows one step commands with increased time     Cueing  General Comments   Cueing Techniques: Verbal cues;Gestural cues;Tactile cues      Exercises     Shoulder Instructions      Home Living Family/patient expects to be  discharged to:: Private residence Living Arrangements: Children Available Help at Discharge: Family;Available PRN/intermittently Type of Home: Apartment Home Access: Stairs to enter Entrance Stairs-Number of Steps: 3 Entrance Stairs-Rails: None Home Layout: One level     Bathroom Shower/Tub: Chief [employer] Officer: Standard Bathroom Accessibility: No   Home Equipment: [employer] (2 wheels);Cane - single point;Shower seat   Additional Comments: reports her son and her live together now. Per prior admission in Nov 25, pt reports son lived next door. son works during the day      Prior Functioning/Environment Prior Level of Function : Independent/Modified Independent;History of Falls (last six months);Patient poor historian/Family not available             Mobility Comments: reports at least 3 recent falls. no AD for mobility ADLs Comments: Reports Mod I for ADLs including showers but also reports L hand doesnt work. unsure of accuracy    OT Problem List: Decreased strength;Decreased activity tolerance;Impaired balance (sitting and/or standing);Decreased cognition;Decreased safety awareness;Decreased knowledge of use of DME or AE;Decreased knowledge of precautions;Impaired UE functional use;Decreased coordination   OT Treatment/Interventions: Self-care/ADL training;Therapeutic exercise;Energy conservation;DME and/or AE instruction;Therapeutic activities;Patient/family education;Balance training      OT Goals(Current goals can be found in the care plan section)   Acute Rehab OT Goals Patient Stated Goal: wanted to brush teeth during  session, agreeable for more mobility with PT later OT Goal Formulation: With patient Time For Goal Achievement: 02/10/24 Potential to Achieve Goals: Good ADL Goals Pt Will Perform Grooming: with contact guard assist;standing Pt Will Perform Lower Body Bathing: sit to/from stand;sitting/lateral leans;with contact guard assist Pt  Will Transfer to Toilet: with contact guard assist;ambulating Pt Will Perform Toileting - Clothing Manipulation and hygiene: with min assist;sitting/lateral leans;sit to/from stand Additional ADL Goal #1: Pt to verbalize at least 3 fall prevention strategies to implement in home environment   OT Frequency:  Min 2X/week    Co-evaluation              AM-PAC OT 6 Clicks Daily Activity     Outcome Measure Help from another person eating meals?: A Little Help from another person taking care of personal grooming?: A Lot Help from another person toileting, which includes using toliet, bedpan, or urinal?: A Lot Help from another person bathing (including washing, rinsing, drying)?: A Lot Help from another person to put on and taking off regular upper body clothing?: A Lot Help from another person to put on and taking off regular lower body clothing?: A Lot 6 Click Score: 13   End of Session Equipment Utilized During Treatment: Gait belt;Other (comment) (sling) Nurse Communication: Mobility status;Other (comment) (IV appeared to be unstable)  Activity Tolerance: Patient tolerated treatment well Patient left: in chair;with call bell/phone within reach;with chair alarm set  OT Visit Diagnosis: Unsteadiness on feet (R26.81);Other abnormalities of gait and mobility (R26.89);History of falling (Z91.81)                Time: 9269-9242 OT Time Calculation (min): 27 min Charges:  OT General Charges $OT Visit: 1 Visit OT Evaluation $OT Eval Moderate Complexity: 1 Mod OT Treatments $Self Care/Home Management : 8-22 mins  Mliss NOVAK, OTR/L Acute Rehab Services Office: [phone]   Mliss Fish 01/27/2024, 8:14 AM

## 2024-01-27 NOTE — Plan of Care (Signed)
  Problem: Clinical Measurements: Goal: Signs and symptoms of infection will decrease Outcome: Progressing   Problem: Education: Goal: Knowledge of General Education information will improve Description: Including pain rating scale, medication(s)/side effects and non-pharmacologic comfort measures Outcome: Progressing   Problem: Health Behavior/Discharge Planning: Goal: Ability to manage health-related needs will improve Outcome: Progressing

## 2024-01-27 NOTE — Plan of Care (Signed)
 1915-adjust thermostat, 5 p's 1930- water  2000- adjust bed, reposition 2015- bsc, 5 p's  2300- full bath, full linen change

## 2024-01-27 NOTE — Evaluation (Signed)
 Clinical/Bedside Swallow Evaluation Patient Details  Name: Wendy [name] MRN: [id number] Date of Birth: 07-03-1955  Today's Date: 01/27/2024 Time: SLP Start Time (ACUTE ONLY): 1005 SLP Stop Time (ACUTE ONLY): 1020 SLP Time Calculation (min) (ACUTE ONLY): 15 min  Past Medical History:  Past Medical History:  Diagnosis Date   Adenocarcinoma of colon (HCC) 07/2007   Alcohol dependency (HCC)    Allergy    Anxiety    Hep C w/o coma, chronic (HCC)    remote h/o IVD   Hepatitis C    Herpes genitalia    History of DVT (deep vein thrombosis) 12/2007   R jugular vein   HSV-1 (herpes simplex virus 1) infection    HSV-2 (herpes simplex virus 2) infection    Hypertension    Migraine headache    Rectal cancer (HCC)    Past Surgical History:  Past Surgical History:  Procedure Laterality Date   ABDOMINAL HYSTERECTOMY  1996   With bilateral oophorectomy.   APPENDECTOMY  1996   BLADDER REPAIR     CLOSED REDUCTION NASAL FRACTURE N/A 10/12/2012   Procedure: CLOSED REDUCTION NASAL BONE FRACTURE WITH STABILZATION ;  Surgeon: Lonni LITTIE Sax, DDS;  Location: [facility] OR;  Service: Oral Surgery;  Laterality: N/A;   COLON SURGERY     COLONOSCOPY W/ BIOPSIES AND POLYPECTOMY     Hx; of   LEFT HEART CATH AND CORONARY ANGIOGRAPHY N/A 12/08/2023   Procedure: LEFT HEART CATH AND CORONARY ANGIOGRAPHY;  Surgeon: Mady Lonni, MD;  Location: [facility];  Service: Cardiovascular;  Laterality: N/A;   TONSILLECTOMY     TONSILLECTOMY AND ADENOIDECTOMY     HPI:  69 y.o. female who was seen in the emergency department 4 days prior to admission due to having a mechanical fall landing on her left side, fracturing her proximal humerus who was brought to the emergency department via EMS 01/26/23 after being found on the floor. Dx sepsis, UTI. Clinical swallow eval 11/15/23: no s/s of oropharyngeal dysphagia; pt c/o globus. There were recs for esophageal w/u.  Pt with medical history significant of rectal  adenocarcinoma alcohol dependency, seasonal allergies, anxiety, chronic hep C, HSV-1 and HSV-2, history of DVT of the right tubular vein, hypertension, migraine headaches, history of TIA/CVA    Assessment / Plan / Recommendation  Clinical Impression  PLAN: Oropharyngeal swallow appears to be [facility]. Recommend further esophageal w/u either as inpt or OP.  Continue regular diet; thin liquids.    Pt with intermittent c/o globus. There are no s/s of an oropharyngeal dysphagia. She consumed regular solids, thin liquids without difficulty. Oral mechanism exam normal. D/W RN. No SLP f/u needed.   SLP Visit Diagnosis: Dysphagia, unspecified (R13.10)    Aspiration Risk  No limitations    Diet Recommendation   Thin;Age appropriate regular  Medication Administration: Whole meds with liquid    Other Recommendations Recommended Consults: Consider esophageal assessment Oral Care Recommendations: Oral care BID     Swallow Evaluation Recommendations     Assistance Recommended at Discharge    Functional Status Assessment    Frequency and Duration            Prognosis        Swallow Study   General HPI: 69 y.o. female who was seen in the emergency department 4 days prior to admission due to having a mechanical fall landing on her left side, fracturing her proximal humerus who was brought to the emergency department via EMS 01/26/23 after being found  on the floor. Dx sepsis, UTI. Clinical swallow eval 11/15/23: no s/s of oropharyngeal dysphagia; pt c/o globus. There were recs for esophageal w/u.  Pt with medical history significant of rectal adenocarcinoma alcohol dependency, seasonal allergies, anxiety, chronic hep C, HSV-1 and HSV-2, history of DVT of the right tubular vein, hypertension, migraine headaches, history of TIA/CVA Type of Study: Bedside Swallow Evaluation Previous Swallow Assessment: see HPI Diet Prior to this Study: Regular;Thin liquids (Level 0) Temperature Spikes Noted:  No Respiratory Status: Room air History of Recent Intubation: No Behavior/Cognition: Alert;Cooperative Oral Cavity Assessment: Within Functional Limits Oral Care Completed by SLP: Recent completion by staff Vision: Functional for self-feeding Self-Feeding Abilities: Able to feed self Patient Positioning: Upright in bed Baseline Vocal Quality: Low vocal intensity Volitional Cough: Strong Volitional Swallow: Able to elicit    Oral/Motor/Sensory Function Overall Oral Motor/Sensory Function: Within functional limits   Ice Chips Ice chips: Within functional limits   Thin Liquid Thin Liquid: Within functional limits    Nectar Thick Nectar Thick Liquid: Not tested   Honey Thick Honey Thick Liquid: Not tested   Puree Puree: Within functional limits   Solid     Solid: Within functional limits      Vona Palma Laurice 01/27/2024,10:44 AM  Palma L. Vona, MA CCC/SLP Clinical Specialist - Acute Care SLP Acute Rehabilitation Services Office number [phone]

## 2024-01-27 NOTE — Progress Notes (Signed)
 " PROGRESS NOTE    Wendy [name]  FMW:[id number] DOB: 1955-12-27 DOA: 01/26/2024 PCP: Pcp, No   Brief Narrative:  69 year old female with history of rectal adenocarcinoma status postresection and radiation, tobacco and alcohol abuse, history of hepatitis C, chronic diastolic CHF, COPD, hypertension, migraine headaches, TIA/CVA, hyperlipidemia and recent mechanical fall 4 days prior to presentation resulting in left proximal humerus fracture presented with altered mental status.  On presentation, UA was suggestive of UTI; UDS positive for opiates and THC.  WBC of 16.37, AST of 94 and ALT of 51 and total bili of 4.8.  lactic acid was 2.3.  Left shoulder x-ray showed acute displaced left proximal humerus fracture.  Pelvis x-ray showed no evidence of traumatic injury.  Chest x-ray showed no acute cardiopulmonary findings.  CT head showed no acute intracranial abnormality.  CT C-spine showed no acute traumatic injuries.  Orthopedics was consulted.  She was started on IV fluids and antibiotics.  Assessment & Plan:   UTI: Present on admission -Follow cultures.  Continue Rocephin   Acute metabolic encephalopathy -Possibly from above.  Monitor mental status.  Imaging as above.  Fall precautions.  PT eval - Slow to respond but answers questions appropriately.  Closed left humeral fracture Patient mechanical fall - Imaging as above.  Orthopedics recommended conservative management: Nonweightbearing left upper extremity in sling and outpatient follow-up with orthopedics. - Continue pain management - PT eval  Dysphagia - Nursing staff reports that patient has issues with swallowing pills this morning.  SLP eval.  Leukocytosis - Resolved  Anemia of chronic disease - From chronic illnesses. Stable.  Monitor intermittently  Hyponatremia -Resolved  Hypokalemia - Replace.  Repeat a.m. labs  Acute metabolic acidosis - Improving.  Monitor  Elevated LFTs - AST still slightly elevated.  ALT and  total bilirubin have improved.  Monitor  Lactic acidosis - Continue IV fluids and antibiotics.  Hypertension - Monitor blood pressure.  Continue lisinopril .  Hyperlipidemia - Statin on hold because of elevated LFTs.   DVT prophylaxis: Start Lovenox  Code Status: Full Family Communication: None at bedside Disposition Plan: Status is: Inpatient Remains inpatient appropriate because: of severity of illness    Consultants: Orthopedics  Procedures: None  Antimicrobials: Rocephin  from 01/26/24 onwards   Subjective: Patient seen and examined at bedside. Complains of left shoulder pain. Feels weak. No fever, vomiting reported.  Slow to respond, poor historian.  Objective: Vitals:   01/26/24 1821 01/26/24 2138 01/27/24 0115 01/27/24 0554  BP:  (!) 142/82 116/62 121/68  Pulse:  77 68 64  Resp:  17 17 17   Temp:  97.9 F (36.6 C) 98.2 F (36.8 C) 98 F (36.7 C)  TempSrc:      SpO2:  96% 99% 93%  Weight: 41.9 kg     Height:        Intake/Output Summary (Last 24 hours) at 01/27/2024 0736 Last data filed at 01/27/2024 0429 Gross per 24 hour  Intake 756.72 ml  Output 450 ml  Net 306.72 ml   Filed Weights   01/26/24 1821  Weight: 41.9 kg    Examination:  General exam: Appears calm and comfortable.  Chronically ill and deconditioned appearing. Respiratory system: Bilateral decreased breath sounds at bases. Cardiovascular system: S1 & S2 heard, Rate controlled Gastrointestinal system: Abdomen is nondistended, soft and nontender. Normal bowel sounds heard. Extremities: No cyanosis, clubbing, edema.  Left upper extremity is in a sling Central nervous system: Alert and oriented.  Slow to respond but answers questions appropriately; poor historian.  No focal neurological deficits. Moving extremities Skin: No rashes, lesions or ulcers Psychiatry: Flat affect.  Not agitated.     Data Reviewed: I have personally reviewed following labs and imaging studies  CBC: Recent  Labs  Lab 01/22/24 0556 01/26/24 0659 01/27/24 0552  WBC 7.5 16.3* 6.2  NEUTROABS  --  15.1* 5.3  HGB 11.2* 9.5* 9.2*  HCT 35.4* 28.7* 27.7*  MCV 102.9* 98.3 96.5  PLT 271 334 320   Basic Metabolic Panel: Recent Labs  Lab 01/22/24 0556 01/26/24 0652 01/26/24 0659 01/27/24 0552  NA 142  --  130* 136  K 3.3*  --  2.8* 2.8*  CL 107  --  88* 100  CO2 22  --  17* 19*  GLUCOSE 106*  --  88 92  BUN 7*  --  10 9  CREATININE 0.58  --  0.89 0.51  CALCIUM  8.8*  --  9.4 8.6*  MG  --  1.7  --   --    GFR: Estimated Creatinine Clearance: 45.8 mL/min (by C-G formula based on SCr of 0.51 mg/dL). Liver Function Tests: Recent Labs  Lab 01/26/24 0659 01/27/24 0552  AST 94* 88*  ALT 51* 39  ALKPHOS 121 88  BILITOT 1.8* 0.9  PROT 7.1 5.7*  ALBUMIN 3.9 3.1*   No results for input(s): LIPASE, AMYLASE in the last 168 hours. No results for input(s): AMMONIA in the last 168 hours. Coagulation Profile: Recent Labs  Lab 01/26/24 0659  INR 1.1   Cardiac Enzymes: No results for input(s): CKTOTAL, CKMB, CKMBINDEX, TROPONINI in the last 168 hours. BNP (last 3 results) No results for input(s): PROBNP in the last 8760 hours. HbA1C: No results for input(s): HGBA1C in the last 72 hours. CBG: Recent Labs  Lab 01/26/24 0756  GLUCAP 100*   Lipid Profile: No results for input(s): CHOL, HDL, LDLCALC, TRIG, CHOLHDL, LDLDIRECT in the last 72 hours. Thyroid Function Tests: No results for input(s): TSH, T4TOTAL, FREET4, T3FREE, THYROIDAB in the last 72 hours. Anemia Panel: No results for input(s): VITAMINB12, FOLATE, FERRITIN, TIBC, IRON, RETICCTPCT in the last 72 hours. Sepsis Labs: Recent Labs  Lab 01/26/24 1210 01/26/24 1809  LATICACIDVEN 2.3* 2.5*    No results found for this or any previous visit (from the past 240 hours).       Radiology Studies: DG Shoulder 1V Left Result Date: 01/26/2024 EXAM: 1 VIEW XRAY OF THE LEFT  SHOULDER 01/26/2024 10:38:00 AM COMPARISON: Left humerus series 08/03/2024 07:21:00 PM. CLINICAL HISTORY: 69 year old female. Proximal humerus fracture. Found down on floor. FINDINGS: BONES AND JOINTS: Acute displaced fracture of the proximal humerus with mild apex anterior angulation. The left humeral head fragment remains normally aligned with the left glenoid. SOFT TISSUES: Visualized lung is unremarkable. IMPRESSION: 1. Acute displaced left proximal humerus fracture with Left glenohumeral alignment maintained. Electronically signed by: Helayne Hurst MD MD 01/26/2024 11:08 AM EST RP Workstation: [id number]   CT CERVICAL SPINE WO CONTRAST Result Date: 01/26/2024 EXAM: CT CERVICAL SPINE WITHOUT CONTRAST 01/26/2024 10:45:29 AM TECHNIQUE: CT of the cervical spine was performed WITHOUT the administration of intravenous contrast. Multiplanar reformatted images are provided for review. Automated exposure control, iterative reconstruction, and/or weight based adjustment of the mA/kV was utilized to reduce the radiation dose to as low as reasonably achievable. COMPARISON: CT cervical spine 01/22/2024. Head CT reported separately today. CLINICAL HISTORY: 68 year old female. Fall. Found down on floor. FINDINGS: BONES AND ALIGNMENT: Osteopenia appears age advanced with superimposed confluent and degenerative appearing endplate and vertebral  body sclerosis in the mid and lower cervical spine. Stable alignment with degenerative anterolisthesis of C3 on C4 and degenerative left facet ankylosis at that level. No acute fracture or traumatic malalignment. DEGENERATIVE CHANGES: Chronic severe disc and endplate degeneration C3-C4 through C6-C7. Vacuum disc at C5-C6. Degenerative cervical spinal stenosis suspected and likely severe at the C5-C6 level (series 13 image 58). SOFT TISSUES: Bulky multifocal calcified cervical carotid artery atherosclerosis. Otherwise negative visible non-contrast neck soft tissues. No prevertebral soft  tissue swelling. LUNGS: Emphysema and mild scarring in the lung apices. IMPRESSION: 1. No acute traumatic injury identified in the cervical spine. 2. Very advanced cervical spine degeneration with evidence of SEVERE multifactorial cervical spinal stenosis at C5-C6. 3. Emphysema. Electronically signed by: Helayne Hurst MD 01/26/2024 11:06 AM EST RP Workstation: [id number]   CT HEAD WO CONTRAST ( ) Result Date: 01/26/2024 EXAM: CT HEAD WITHOUT CONTRAST 01/26/2024 10:45:29 AM TECHNIQUE: CT of the head was performed without the administration of intravenous contrast. Automated exposure control, iterative reconstruction, and/or weight based adjustment of the mA/kV was utilized to reduce the radiation dose to as low as reasonably achievable. COMPARISON: Head CT 01/22/2024. CLINICAL HISTORY: 69 year old female. Left-sided facial droop, possible stroke-like symptoms. Found down on floor. FINDINGS: BRAIN AND VENTRICLES: No acute hemorrhage. No evidence of acute infarct. No hydrocephalus. No extra-axial collection. No mass effect or midline shift. Brain volume stable and at the lower limits of normal for age. Moderate patchy bilateral cerebral white matter hypodensity is stable. Calcified atherosclerosis at the skull base. No suspicious intracranial vascular hyperdensity. ORBITS: No acute abnormality. SINUSES: Paranasal sinuses remain well aerated. SOFT TISSUES AND SKULL: No acute soft tissue abnormality. No skull fracture. Tympanic cavities and mastoids remain well aerated. IMPRESSION: 1. No acute traumatic injury or intracranial abnormality identified. 2. Stable moderately advanced cerebral white matter disease. Electronically signed by: Helayne Hurst MD MD 01/26/2024 11:02 AM EST RP Workstation: [id number]   DG Elbow 2 Views Left Result Date: 01/26/2024 EXAM: 1 or 2 VIEW(S) XRAY OF THE LEFT ELBOW COMPARISON: 01/22/2024. CLINICAL HISTORY: fall, concern for recurrent injury FINDINGS: LIMITATIONS: Limited evaluation due  to positioning. BONES AND JOINTS: Possible anterior joint effusion. Equivocal cortical defect along the radial head. Differential diagnosis for the cortical defect includes a non-displaced fracture, an old injury, or a normal variant. Follow-up imaging or clinical correlation is recommended. No malalignment. SOFT TISSUES: Unremarkable. IMPRESSION: 1. Limited evaluation due to positioning. 2. Possible anterior joint effusion with equivocal cortical defect along the radial head; recommend immobilization and follow-up radiographs in 7-10 days. Electronically signed by: Waddell Calk MD MD 01/26/2024 07:52 AM EST RP Workstation: [id number]   DG Shoulder Left Portable Result Date: 01/26/2024 EXAM: 1 VIEW(S) XRAY OF THE LEFT SHOULDER 01/26/2024 07:40:00 AM COMPARISON: 01/22/2024 CLINICAL HISTORY: known fracture, recurrent fall FINDINGS: BONES AND JOINTS: Comminuted fracture of the left humeral neck and greater tuberosity with apex lateral angulation of 60 degrees. Increased proximal displacement of the distal fracture fragments relative to the humeral head. [facility] joint is unremarkable. SOFT TISSUES: Diffuse soft tissue swelling. No abnormal calcifications. Visualized lung is unremarkable. IMPRESSION: 1. Comminuted fracture of the left humeral neck and greater tuberosity with apex lateral angulation of 60 degrees and increased proximal displacement of the distal fracture fragments relative to the humeral head, compared to 01/22/2024. 2. Diffuse soft tissue swelling. Electronically signed by: Waddell Calk MD MD 01/26/2024 07:48 AM EST RP Workstation: [id number]   DG Chest Portable 1 View Result Date: 01/26/2024 EXAM: 1 VIEW(S) XRAY OF THE CHEST  01/26/2024 07:40:00 AM COMPARISON: 12/06/2023 CLINICAL HISTORY: fall FINDINGS: LUNGS AND PLEURA: No focal pulmonary opacity. No pleural effusion. No pneumothorax. HEART AND MEDIASTINUM: Aortic calcification. No acute abnormality of the cardiac and mediastinal silhouettes.  BONES AND SOFT TISSUES: Acute displaced fracture of surgical neck of left proximal humerus. IMPRESSION: 1. Acute displaced fracture of the surgical neck of the left proximal humerus. 2. No acute cardiopulmonary findings. Electronically signed by: Waddell Calk MD MD 01/26/2024 07:45 AM EST RP Workstation: [id number]   DG Pelvis Portable Result Date: 01/26/2024 EXAM: 1 OR 2 VIEW(S) XRAY OF THE PELVIS 01/26/2024 07:40:00 AM COMPARISON: 01/22/2024 CLINICAL HISTORY: fall FINDINGS: BONES AND JOINTS: No acute fracture. No malalignment. SOFT TISSUES: Atherosclerotic plaque. IMPRESSION: 1. No evidence of acute traumatic injury. Electronically signed by: Waddell Calk MD MD 01/26/2024 07:44 AM EST RP Workstation: [id number]        Scheduled Meds:  aspirin  EC  81 mg Oral Daily   lisinopril   5 mg Oral Daily   pantoprazole   40 mg Oral Daily   Continuous Infusions:  0.9 % NaCl with KCl 20 mEq / L 50 mL/hr at 01/26/24 1805   cefTRIAXone  (ROCEPHIN )  IV            Sophie Mao, MD [facility] 01/27/2024, 7:36 AM   "

## 2024-01-27 NOTE — NC FL2 (Signed)
 " [place]  MEDICAID FL2 LEVEL OF CARE FORM     IDENTIFICATION  Patient Name: Wendy [name] Birthdate: Jan 18, 1955 Sex: female Admission Date (Current Location): 01/26/2024  [facility] and [place] Number:  Producer, Television/film/video and Address:  [facility],  [address] [place] [address] [place] [address]      Provider Number: [id number]  Attending Physician Name and Address:  Cheryle Page, MD  Relative Name and Phone Number:  [name] Klebba(son)[phone]    Current Level of Care: Hospital Recommended Level of Care: Skilled Nursing Facility Prior Approval Number:    Date Approved/Denied:   PASRR Number: [id number]  Discharge Plan: SNF    Current Diagnoses: Patient Active Problem List   Diagnosis Date Noted   Sepsis secondary to UTI (HCC) 01/26/2024   Chronic respiratory failure with hypoxia (HCC) 01/26/2024   Hypokalemia 01/26/2024   Hyponatremia 01/26/2024   High anion gap metabolic acidosis 01/26/2024   Normocytic anemia 01/26/2024   Closed left humeral fracture 01/26/2024   Shock (HCC) 12/05/2023   Acute pulmonary edema (HCC) 12/05/2023   Acute on chronic respiratory failure with hypercapnia (HCC) 12/05/2023   Non-ST elevation (NSTEMI) myocardial infarction (HCC) 12/05/2023   Respiratory distress 11/14/2023   QT prolongation 11/14/2023   Closed fracture nasal bone 10/12/2012   Rectal cancer (HCC) 12/20/2011   Hep C w/o coma, chronic (HCC)    HSV-1 (herpes simplex virus 1) infection    Migraine headache    History of DVT (deep vein thrombosis)    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    Orientation RESPIRATION BLADDER Height & Weight     Self, Time, Situation  Normal Continent Weight: 41.9 kg Height:  5' 2 (157.5 cm)  BEHAVIORAL SYMPTOMS/MOOD NEUROLOGICAL BOWEL NUTRITION STATUS      Continent Diet (Regular)  AMBULATORY STATUS COMMUNICATION OF NEEDS Skin   Limited Assist Verbally Normal                       Personal Care Assistance  Level of Assistance  Feeding, Dressing   Feeding assistance: Limited assistance Dressing Assistance: Limited assistance     Functional Limitations Info  Sight, Hearing, Speech Sight Info: Impaired (eyeglasses) Hearing Info: Adequate Speech Info: Impaired (dentures top/bottom)    SPECIAL CARE FACTORS FREQUENCY  PT (By licensed PT), OT (By licensed OT)     PT Frequency: 5x week OT Frequency: 5x week            Contractures Contractures Info: Not present    Additional Factors Info  Code Status, Allergies Code Status Info: Full code Allergies Info: Amoxicillin, Vancomycin, Erythromycin Base           Current Medications (01/27/2024):  This is the current hospital active medication list Current Facility-Administered Medications  Medication Dose Route Frequency Provider Last Rate Last Admin   acetaminophen  (TYLENOL ) tablet 650 mg  650 mg Oral Q6H PRN Celinda Alm Lot, MD   650 mg at 01/26/24 1833   Or   acetaminophen  (TYLENOL ) suppository 650 mg  650 mg Rectal Q6H PRN Celinda Alm Lot, MD       aspirin  EC tablet 81 mg  81 mg Oral Daily Celinda Alm Lot, MD   81 mg at 01/27/24 9094   cefTRIAXone  (ROCEPHIN ) 1 g in sodium chloride  0.9 % 100 mL IVPB  1 g Intravenous Q24H Celinda Alm Lot, MD 200 mL/hr at 01/27/24 0957 1 g at 01/27/24 0957   lisinopril  (ZESTRIL ) tablet 5 mg  5 mg Oral Daily Celinda Alm Lot, MD   5 mg at 01/27/24 9094   oxyCODONE  (Oxy IR/ROXICODONE ) immediate release tablet 10 mg  10 mg Oral Q6H PRN Teresa Rankin ORN, MD       oxyCODONE  (Oxy IR/ROXICODONE ) immediate release tablet 5 mg  5 mg Oral Q6H PRN Teresa Rankin ORN, MD       pantoprazole  (PROTONIX ) EC tablet 40 mg  40 mg Oral Daily Celinda Alm Lot, MD   40 mg at 01/27/24 9094   potassium chloride  10 mEq in 100 mL IVPB  10 mEq Intravenous Q1 Hr x 6 Alekh, Kshitiz, MD 100 mL/hr at 01/27/24 1112 10 mEq at 01/27/24 1112   prochlorperazine  (COMPAZINE ) injection 5 mg  5 mg Intravenous Q4H PRN Celinda Alm Lot, MD         Discharge Medications: Please see discharge summary for a list of discharge medications.  Relevant Imaging Results:  Relevant Lab Results:   Additional Information ss#[id number]  Sheetal Lyall, Nathanel, RN     "

## 2024-01-27 NOTE — TOC Initial Note (Addendum)
 Transition of Care [facility]) - Initial/Assessment Note    Patient Details  Name: Wendy [name] MRN: [id number] Date of Birth: [birth year]  [facility]) CM/SW Contact:    Bascom Service, RN Phone Number: 01/27/2024, 11:49 AM  Clinical Narrative: LVM w/[name](son) to discuss d/c plans-PT recc ST SNF-await to discuss if patient agree to ST SNF.   -12p spoke to [facility]) abut d/c plans-agree to fax out for ST SNF.                Expected Discharge Plan: Home w Home Health Services Barriers to Discharge: Continued Medical Work up   Patient Goals and CMS Choice Patient states their goals for this hospitalization and ongoing recovery are:: Home CMS Medicare.gov Compare Post Acute Care list provided to:: Patient Choice offered to / list presented to : Patient [place] ownership interest in [facility].provided to:: Patient    Expected Discharge Plan and Services     Post Acute Care Choice: Resumption of Svcs/PTA Provider Living arrangements for the past 2 months: Single Family Home                                      Prior Living Arrangements/Services Living arrangements for the past 2 months: Single Family Home Lives with:: Adult Children   Do you feel safe going back to the place where you live?: Yes               Activities of Daily Living   ADL Screening (condition at time of admission) Independently performs ADLs?: No Does the patient have a NEW difficulty with bathing/dressing/toileting/self-feeding that is expected to last >3 days?: Yes (Initiates electronic notice to provider for possible OT consult) Does the patient have a NEW difficulty with getting in/out of bed, walking, or climbing stairs that is expected to last >3 days?: Yes (Initiates electronic notice to provider for possible PT consult) Does the patient have a NEW difficulty with communication that is expected to last >3 days?: No Is the patient deaf or have difficulty  hearing?: No Does the patient have difficulty seeing, even when wearing glasses/contacts?: No Does the patient have difficulty concentrating, remembering, or making decisions?: Yes  Permission Sought/Granted Permission sought to share information with : Case Manager Permission granted to share information with : Yes, Verbal Permission Granted              Emotional Assessment              Admission diagnosis:  Acidosis [E87.20] Hypokalemia [E87.6] Hyponatremia [E87.1] Sepsis secondary to UTI (HCC) [A41.9, N39.0] Fall, initial encounter [W19.XXXA] Urinary tract infection with hematuria, site unspecified [N39.0, R31.9] Closed fracture of proximal end of left humerus with malunion, unspecified fracture morphology, subsequent encounter [S42.202P] Patient Active Problem List   Diagnosis Date Noted   Sepsis secondary to UTI (HCC) 01/26/2024   Chronic respiratory failure with hypoxia (HCC) 01/26/2024   Hypokalemia 01/26/2024   Hyponatremia 01/26/2024   High anion gap metabolic acidosis 01/26/2024   Normocytic anemia 01/26/2024   Closed left humeral fracture 01/26/2024   Shock (HCC) 12/05/2023   Acute pulmonary edema (HCC) 12/05/2023   Acute on chronic respiratory failure with hypercapnia (HCC) 12/05/2023   Non-ST elevation (NSTEMI) myocardial infarction (HCC) 12/05/2023   Respiratory distress 11/14/2023   QT prolongation 11/14/2023   Closed fracture nasal bone 10/12/2012   Rectal cancer (HCC) 12/20/2011   Hep  C w/o coma, chronic (HCC)    HSV-1 (herpes simplex virus 1) infection    Migraine headache    History of DVT (deep vein thrombosis)    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   PCP:  Pcp, No Pharmacy:   [facility] [address] [place] [address] Phone: [phone] Fax: [phone]  [id number] [place], [place] - [address] AT [facility] [address] [facility] [address] [place]  [address] Phone: [phone] Fax: [phone]  [place] [facility] [address] [place] [address] Phone: [phone] Fax: [phone]     Social Drivers of Health (SDOH) Social History: SDOH Screenings   Food Insecurity: Food Insecurity Present (01/26/2024)  Housing: High Risk (01/26/2024)  Transportation Needs: No Transportation Needs (01/26/2024)  Utilities: At Risk (01/26/2024)  Social Connections: Socially Isolated (01/26/2024)  Tobacco Use: High Risk (01/26/2024)   SDOH Interventions:     Readmission Risk Interventions    11/17/2023    8:29 AM  Readmission Risk Prevention Plan  Post Dischage Appt Complete  Medication Screening Complete  Transportation Screening Complete

## 2024-01-28 DIAGNOSIS — S42292P Other displaced fracture of upper end of left humerus, subsequent encounter for fracture with malunion: Secondary | ICD-10-CM | POA: Diagnosis not present

## 2024-01-28 DIAGNOSIS — E876 Hypokalemia: Secondary | ICD-10-CM | POA: Diagnosis not present

## 2024-01-28 DIAGNOSIS — A419 Sepsis, unspecified organism: Secondary | ICD-10-CM

## 2024-01-28 DIAGNOSIS — N390 Urinary tract infection, site not specified: Secondary | ICD-10-CM

## 2024-01-28 DIAGNOSIS — E872 Acidosis, unspecified: Secondary | ICD-10-CM | POA: Diagnosis not present

## 2024-01-28 DIAGNOSIS — E871 Hypo-osmolality and hyponatremia: Secondary | ICD-10-CM | POA: Diagnosis not present

## 2024-01-28 DIAGNOSIS — D649 Anemia, unspecified: Secondary | ICD-10-CM | POA: Diagnosis not present

## 2024-01-28 LAB — CBC WITH DIFFERENTIAL/PLATELET
Abs Immature Granulocytes: 0.03 K/uL (ref 0.00–0.07)
Basophils Absolute: 0 K/uL (ref 0.0–0.1)
Basophils Relative: 0 %
Eosinophils Absolute: 0 K/uL (ref 0.0–0.5)
Eosinophils Relative: 1 %
HCT: 23.2 % — ABNORMAL LOW (ref 36.0–46.0)
Hemoglobin: 8 g/dL — ABNORMAL LOW (ref 12.0–15.0)
Immature Granulocytes: 1 %
Lymphocytes Relative: 10 %
Lymphs Abs: 0.6 K/uL — ABNORMAL LOW (ref 0.7–4.0)
MCH: 33.1 pg (ref 26.0–34.0)
MCHC: 34.5 g/dL (ref 30.0–36.0)
MCV: 95.9 fL (ref 80.0–100.0)
Monocytes Absolute: 0.6 K/uL (ref 0.1–1.0)
Monocytes Relative: 10 %
Neutro Abs: 4.6 K/uL (ref 1.7–7.7)
Neutrophils Relative %: 78 %
Platelets: 314 K/uL (ref 150–400)
RBC: 2.42 MIL/uL — ABNORMAL LOW (ref 3.87–5.11)
RDW: 16 % — ABNORMAL HIGH (ref 11.5–15.5)
Smear Review: NORMAL
WBC: 5.9 K/uL (ref 4.0–10.5)
nRBC: 0 % (ref 0.0–0.2)

## 2024-01-28 LAB — COMPREHENSIVE METABOLIC PANEL WITH GFR
ALT: 28 U/L (ref 0–44)
AST: 45 U/L — ABNORMAL HIGH (ref 15–41)
Albumin: 2.7 g/dL — ABNORMAL LOW (ref 3.5–5.0)
Alkaline Phosphatase: 71 U/L (ref 38–126)
Anion gap: 9 (ref 5–15)
BUN: <5 mg/dL — ABNORMAL LOW (ref 8–23)
CO2: 24 mmol/L (ref 22–32)
Calcium: 8 mg/dL — ABNORMAL LOW (ref 8.9–10.3)
Chloride: 102 mmol/L (ref 98–111)
Creatinine, Ser: 0.31 mg/dL — ABNORMAL LOW (ref 0.44–1.00)
GFR, Estimated: >60 mL/min
Glucose, Bld: 99 mg/dL (ref 70–99)
Potassium: 3.2 mmol/L — ABNORMAL LOW (ref 3.5–5.1)
Sodium: 134 mmol/L — ABNORMAL LOW (ref 135–145)
Total Bilirubin: 0.7 mg/dL (ref 0.0–1.2)
Total Protein: 5.2 g/dL — ABNORMAL LOW (ref 6.5–8.1)

## 2024-01-28 LAB — VITAMIN B12: Vitamin B-12: 421 pg/mL (ref 180–914)

## 2024-01-28 LAB — IRON AND TIBC
Iron: 29 ug/dL (ref 28–170)
Saturation Ratios: 19 % (ref 10.4–31.8)
TIBC: 151 ug/dL — ABNORMAL LOW (ref 250–450)
UIBC: 123 ug/dL

## 2024-01-28 LAB — FERRITIN: Ferritin: 411 ng/mL — ABNORMAL HIGH (ref 11–307)

## 2024-01-28 LAB — MAGNESIUM: Magnesium: 1.9 mg/dL (ref 1.7–2.4)

## 2024-01-28 LAB — FOLATE: Folate: 6.2 ng/mL

## 2024-01-28 MED ORDER — ALBUTEROL SULFATE HFA 108 (90 BASE) MCG/ACT IN AERS
2.0000 | INHALATION_SPRAY | Freq: Four times a day (QID) | RESPIRATORY_TRACT | Status: DC | PRN
  Administered 2024-02-02: 2 via RESPIRATORY_TRACT
  Filled 2024-01-28: qty 6.7

## 2024-01-28 MED ORDER — LISINOPRIL 10 MG PO TABS
10.0000 mg | ORAL_TABLET | Freq: Every day | ORAL | Status: DC
  Administered 2024-01-29 – 2024-02-03 (×6): 10 mg via ORAL
  Filled 2024-01-28 (×6): qty 1

## 2024-01-28 MED ORDER — MELATONIN 5 MG PO TABS
5.0000 mg | ORAL_TABLET | Freq: Every evening | ORAL | Status: AC | PRN
  Administered 2024-01-28 – 2024-01-30 (×3): 5 mg via ORAL
  Filled 2024-01-28 (×3): qty 1

## 2024-01-28 MED ORDER — SODIUM CHLORIDE 0.9 % IV SOLN
2.0000 g | INTRAVENOUS | Status: DC
  Administered 2024-01-28: 2 g via INTRAVENOUS
  Filled 2024-01-28: qty 20

## 2024-01-28 MED ORDER — POTASSIUM CHLORIDE 20 MEQ PO PACK
40.0000 meq | PACK | ORAL | Status: AC
  Administered 2024-01-28 (×2): 40 meq via ORAL
  Filled 2024-01-28 (×2): qty 2

## 2024-01-28 NOTE — TOC Progression Note (Signed)
 Transition of Care [facility]) - Progression Note    Patient Details  Name: Wendy [name] MRN: [id number] Date of Birth: 08-Jul-1955  Transition of Care [facility]) CM/SW Contact  Marque Rademaker, Nathanel, RN Phone Number: 01/28/2024, 3:55 PM  Clinical Narrative:  Provided w/bed offers await choice.  [facility] SNF  Accepted -- [address] [place] [place] [address] [phone] --  [facility] SNF  Accepted -- [address] [place] [address] [phone] --  [facility] Preferred SNF  Accepted -- [address], [place] [address] [phone] --  [facility] SN[facility]  Accepted -- [address] [place] [address] [phone] --  [facility]  Accepted -- [address], [place] [address] [phone] --  [facility] SNF  Accepted -- [address], [place] [address] [phone] --  [facility] SN[facility]  Accepted -- [address], [place] [address] [phone] --  [facility] Preferred [facility]  Accepted -- [address], [place] [address] [phone] --  [facility]  Accepted -- [address] [place] [address] [phone] --  [facility] SNF  Accepted -- [address], [place] [address] [phone]       Expected Discharge Plan: Skilled Nursing Facility Barriers to Discharge: Continued Medical Work up               Expected Discharge Plan and Services   Discharge Planning Services: CM Consult Post Acute Care Choice: Skilled Nursing Facility Living arrangements for the past 2 months: Single Family Home                                       Social Drivers of Health (SDOH) Interventions SDOH Screenings   Food Insecurity: Food Insecurity Present (01/26/2024)  Housing:  High Risk (01/26/2024)  Transportation Needs: No Transportation Needs (01/26/2024)  Utilities: At Risk (01/26/2024)  Social Connections: Socially Isolated (01/26/2024)  Tobacco Use: High Risk (01/26/2024)    Readmission Risk Interventions    01/27/2024   11:50 AM 11/17/2023    8:29 AM  Readmission Risk Prevention Plan  Post Dischage Appt  Complete  Medication Screening  Complete  Transportation Screening Complete Complete  Medication Review (RN Care Manager) Complete   PCP or Specialist appointment within 3-5 days of discharge Complete   SW Recovery Care/Counseling Consult Complete   Palliative Care Screening Not Applicable   Skilled Nursing Facility Complete

## 2024-01-28 NOTE — Plan of Care (Signed)
  Problem: Clinical Measurements: Goal: Diagnostic test results will improve Outcome: Progressing Goal: Signs and symptoms of infection will decrease Outcome: Progressing   Problem: Respiratory: Goal: Ability to maintain adequate ventilation will improve Outcome: Progressing   

## 2024-01-28 NOTE — Progress Notes (Signed)
 Mobility Specialist - Progress Note   01/28/24 1552  Mobility  Activity Ambulated with assistance  Level of Assistance Minimal assist, patient does 75% or more  Assistive Device Other (Comment) (HHA)  Distance Ambulated (ft) 20 ft  Range of Motion/Exercises Active  LUE Weight Bearing Per Provider Order NWB  Activity Response Tolerated fair  Mobility visit 1 Mobility  Mobility Specialist Start Time (ACUTE ONLY) 1540  Mobility Specialist Stop Time (ACUTE ONLY) 1552  Mobility Specialist Time Calculation (min) (ACUTE ONLY) 12 min   Pt was found in bed and agreeable to mobilize. Once EOB stated needing to have a BM. Assisted to bathroom and back to bed. Deferred ambulation due to pt c/o dizziness but subsiding once back in bed. At EOS was left in bed with all needs met. Call bell in reach.   Erminio Leos,  Mobility Specialist Can be reached via Secure Chat

## 2024-01-28 NOTE — Progress Notes (Signed)
 " PROGRESS NOTE    Wendy [name]  FMW:[id number] DOB: 1955/01/21 DOA: 01/26/2024 PCP: Pcp, No    Chief Complaint  Patient presents with   Fall    Brief Narrative:  69 year old female with history of rectal adenocarcinoma status postresection and radiation, tobacco and alcohol abuse, history of hepatitis C, chronic diastolic CHF, COPD, hypertension, migraine headaches, TIA/CVA, hyperlipidemia and recent mechanical fall 4 days prior to presentation resulting in left proximal humerus fracture presented with altered mental status.  On presentation, UA was suggestive of UTI; UDS positive for opiates and THC.  WBC of 16.37, AST of 94 and ALT of 51 and total bili of 4.8.  lactic acid was 2.3.  Left shoulder x-ray showed acute displaced left proximal humerus fracture.  Pelvis x-ray showed no evidence of traumatic injury.  Chest x-ray showed no acute cardiopulmonary findings.  CT head showed no acute intracranial abnormality.  CT C-spine showed no acute traumatic injuries.  Orthopedics was consulted.  She was started on IV fluids and antibiotics.     Assessment & Plan:   Principal Problem:   Sepsis secondary to UTI [facility]) Active Problems:   Chronic respiratory failure with hypoxia (HCC)   Hypokalemia   Hyponatremia   High anion gap metabolic acidosis   Normocytic anemia   Closed left humeral fracture   Acidosis  #1 sepsis secondary to UTI -Patient was admission to have met criteria for sepsis with leukocytosis, elevated lactic acid level.  Urinalysis concerning for possible UTI. -Blood cultures with no growth to date. - Urine cultures negative. - Continue IV Rocephin  day 3 - Supportive care.  2.  Acute metabolic encephalopathy -Likely secondary to probable UTI. - CT head negative for an acute abnormality. - Patient improving clinically.  3.  Closed left humeral fracture/mechanical fall -Secondary to mechanical fall. - Patient seen in consultation by orthopedics who are recommending  conservative management for now. - NWB to left upper extremity in sling with outpatient follow-up with orthopedics. - Continue current pain management. - PT/OT.  4.  Dysphagia -Nursing staff noted to have reported issues with swallowing pills. - Patient seen by SLP who assessed patient and recommended continuation of regular diet with thin liquids and may need esophageal workup either in the inpatient versus outpatient. - Outpatient follow-up.  5.  Leukocytosis -Resolved.  6.  Anemia of chronic disease -Hemoglobin currently at 8.0.  Likely dilutional. - Follow.  7.  Hyponatremia - Follow-up.  8.  Hypokalemia - Magnesium  1.9. - Replete.  9.  Acute metabolic acidosis -Improved.  10.  Elevated LFTs -Improving. - Follow-up.  11.  Hypertension -Increase lisinopril  to 10 mg daily.  12.  Hyperlipidemia -Continue to hold statin due to elevated LFTs. - Outpatient follow-up.   DVT prophylaxis: SCDs Code Status: Full Family Communication: Updated patient.  No family at bedside. Disposition: SNF when clinically improved.  Status is: Inpatient Remains inpatient appropriate because: Severity of illness   Consultants:  Orthopedics: Dr. Teresa 01/26/2024  Procedures:  CT head CT C-spine 01/26/2024 Chest x-ray 01/26/2024 Plain films of the left elbow 01/26/2024 Plain films of the pelvis 01/26/2024 Plain films of the left shoulder 01/26/2024  Antimicrobials:  Anti-infectives (From admission, onward)    Start     Dose/Rate Route Frequency Ordered Stop   01/28/24 1000  cefTRIAXone  (ROCEPHIN ) 2 g in sodium chloride  0.9 % 100 mL IVPB        2 g 200 mL/hr over 30 Minutes Intravenous Every 24 hours 01/28/24 0841  01/27/24 1000  cefTRIAXone  (ROCEPHIN ) 1 g in sodium chloride  0.9 % 100 mL IVPB  Status:  Discontinued        1 g 200 mL/hr over 30 Minutes Intravenous Every 24 hours 01/26/24 2201 01/28/24 0841   01/26/24 0900  cefTRIAXone  (ROCEPHIN ) 1 g in sodium chloride  0.9 % 100 mL  IVPB        1 g 200 mL/hr over 30 Minutes Intravenous  Once 01/26/24 0852 01/26/24 1036         Subjective: Patient sitting up in bed noted to have just finished eating her lunch.  Denies any chest pain or shortness of breath.  No abdominal pain.  Denies any significant dysuria.  Overall feeling better than she did on admission.  Objective: Vitals:   01/27/24 1258 01/28/24 0500 01/28/24 0528 01/28/24 1313  BP: 131/63  (!) 146/66 (!) 161/91  Pulse: 70  79 87  Resp: 16  18 18   Temp: 99.1 F (37.3 C)  97.9 F (36.6 C) 97.8 F (36.6 C)  TempSrc: Oral   Oral  SpO2: 99%  98% 97%  Weight:  45 kg    Height:        Intake/Output Summary (Last 24 hours) at 01/28/2024 1911 Last data filed at 01/28/2024 1807 Gross per 24 hour  Intake 1140 ml  Output 1100 ml  Net 40 ml   Filed Weights   01/26/24 1821 01/28/24 0500  Weight: 41.9 kg 45 kg    Examination:  General exam: Appears calm and comfortable left upper extremity in sling. Respiratory system: Clear to auscultation. Respiratory effort normal. Cardiovascular system: S1 & S2 heard, RRR. No JVD, murmurs, rubs, gallops or clicks. No pedal edema. Gastrointestinal system: Abdomen is nondistended, soft and nontender. No organomegaly or masses felt. Normal bowel sounds heard. Central nervous system: Alert and oriented. No focal neurological deficits. Extremities: Left upper extremity in sling. Skin: No rashes, lesions or ulcers Psychiatry: Judgement and insight appear normal. Mood & affect appropriate.     Data Reviewed: I have personally reviewed following labs and imaging studies  CBC: Recent Labs  Lab 01/22/24 0556 01/26/24 0659 01/27/24 0552 01/28/24 0557  WBC 7.5 16.3* 6.2 5.9  NEUTROABS  --  15.1* 5.3 4.6  HGB 11.2* 9.5* 9.2* 8.0*  HCT 35.4* 28.7* 27.7* 23.2*  MCV 102.9* 98.3 96.5 95.9  PLT 271 334 320 314    Basic Metabolic Panel: Recent Labs  Lab 01/22/24 0556 01/26/24 0652 01/26/24 0659 01/27/24 0552  01/28/24 0557  NA 142  --  130* 136 134*  K 3.3*  --  2.8* 2.8* 3.2*  CL 107  --  88* 100 102  CO2 22  --  17* 19* 24  GLUCOSE 106*  --  88 92 99  BUN 7*  --  10 9 <5*  CREATININE 0.58  --  0.89 0.51 0.31*  CALCIUM  8.8*  --  9.4 8.6* 8.0*  MG  --  1.7  --  1.9 1.9    GFR: Estimated Creatinine Clearance: 49.1 mL/min (A) (by C-G formula based on SCr of 0.31 mg/dL (L)).  Liver Function Tests: Recent Labs  Lab 01/26/24 0659 01/27/24 0552 01/28/24 0557  AST 94* 88* 45*  ALT 51* 39 28  ALKPHOS 121 88 71  BILITOT 1.8* 0.9 0.7  PROT 7.1 5.7* 5.2*  ALBUMIN 3.9 3.1* 2.7*    CBG: Recent Labs  Lab 01/26/24 0756  GLUCAP 100*     Recent Results (from the past 240 hours)  Blood culture (routine x 2)     Status: None (Preliminary result)   Collection Time: 01/26/24  7:06 AM   Specimen: BLOOD  Result Value Ref Range Status   Specimen Description   Final    BLOOD SITE NOT SPECIFIED Performed at [facility], [address], [place], [place] [address]    Special Requests   Final    BOTTLES DRAWN AEROBIC AND ANAEROBIC Blood Culture results may not be optimal due to an inadequate volume of blood received in culture bottles Performed at [facility], [address], [place], [place] [address]    Culture   Final    NO GROWTH 2 DAYS Performed at [facility], [address], [place], [place] [address]    Report Status PENDING  Incomplete  Urine Culture     Status: None   Collection Time: 01/26/24  7:55 AM   Specimen: Urine, Catheterized  Result Value Ref Range Status   Specimen Description   Final    URINE, CATHETERIZED Performed at [facility], [address] [place], [place] [address]    Special Requests   Final    NONE Performed at [facility], [address], [place], [place] [address]    Culture   Final    NO GROWTH Performed at [facility], [address], [place],  [place] [address]    Report Status 01/27/2024 FINAL  Final  Blood culture (routine x 2)     Status: None (Preliminary result)   Collection Time: 01/26/24 10:48 AM   Specimen: BLOOD  Result Value Ref Range Status   Specimen Description   Final    BLOOD SITE NOT SPECIFIED Performed at [facility], [address], [place], [place] [address]    Special Requests   Final    BOTTLES DRAWN AEROBIC AND ANAEROBIC Blood Culture adequate volume Performed at [facility], [address], [place], [place] [address]    Culture   Final    NO GROWTH 2 DAYS Performed at [facility], [address] [place], [place] [address]    Report Status PENDING  Incomplete         Radiology Studies: No results found.      Scheduled Meds:  aspirin  EC  81 mg Oral Daily   [START ON 01/29/2024] lisinopril   10 mg Oral Daily   pantoprazole   40 mg Oral Daily   Continuous Infusions:  cefTRIAXone  (ROCEPHIN )  IV 2 g (01/28/24 1034)     LOS: 2 days    Time spent: 40 minutes    Toribio Hummer, MD [facility]   To contact the attending provider between 7A-7P or the covering provider during after hours 7P-7A, please log into the web site [email or web address] and access using universal [place] password for that web site. If you do not have the password, please call the hospital operator.  01/28/2024, 7:11 PM    "

## 2024-01-28 NOTE — Progress Notes (Signed)
 Pt requesting more water - pt was given a full cup of water  around 1630 and educated that she is on a fluid restriction, and that this would be the last cup of water  she can have from day shift or she will exceed her restriction.  At 1800 pt requesting more water - there is still about 200 ml left in her cup and she used some to take pain medication. I discussed the need to conserve the rest of the water  and pt stated you are not my boss, I need the water . Pt educated on why there is a restriction in place, she again stated you are not my boss, you can't keep water  from me. Water  left at bedside, dinner tray with milk in front of pt.

## 2024-01-29 ENCOUNTER — Inpatient Hospital Stay (HOSPITAL_COMMUNITY)

## 2024-01-29 DIAGNOSIS — A419 Sepsis, unspecified organism: Secondary | ICD-10-CM | POA: Diagnosis not present

## 2024-01-29 DIAGNOSIS — S42292P Other displaced fracture of upper end of left humerus, subsequent encounter for fracture with malunion: Secondary | ICD-10-CM | POA: Diagnosis not present

## 2024-01-29 DIAGNOSIS — N390 Urinary tract infection, site not specified: Secondary | ICD-10-CM | POA: Diagnosis not present

## 2024-01-29 DIAGNOSIS — E872 Acidosis, unspecified: Secondary | ICD-10-CM | POA: Diagnosis not present

## 2024-01-29 DIAGNOSIS — D649 Anemia, unspecified: Secondary | ICD-10-CM | POA: Diagnosis not present

## 2024-01-29 DIAGNOSIS — E876 Hypokalemia: Secondary | ICD-10-CM | POA: Diagnosis not present

## 2024-01-29 DIAGNOSIS — E871 Hypo-osmolality and hyponatremia: Secondary | ICD-10-CM | POA: Diagnosis not present

## 2024-01-29 LAB — CBC WITH DIFFERENTIAL/PLATELET
Abs Immature Granulocytes: 0.05 K/uL (ref 0.00–0.07)
Basophils Absolute: 0 K/uL (ref 0.0–0.1)
Basophils Relative: 0 %
Eosinophils Absolute: 0 K/uL (ref 0.0–0.5)
Eosinophils Relative: 0 %
HCT: 27 % — ABNORMAL LOW (ref 36.0–46.0)
Hemoglobin: 8.7 g/dL — ABNORMAL LOW (ref 12.0–15.0)
Immature Granulocytes: 1 %
Lymphocytes Relative: 11 %
Lymphs Abs: 0.8 K/uL (ref 0.7–4.0)
MCH: 32.1 pg (ref 26.0–34.0)
MCHC: 32.2 g/dL (ref 30.0–36.0)
MCV: 99.6 fL (ref 80.0–100.0)
Monocytes Absolute: 0.5 K/uL (ref 0.1–1.0)
Monocytes Relative: 7 %
Neutro Abs: 6.1 K/uL (ref 1.7–7.7)
Neutrophils Relative %: 81 %
Platelets: 337 K/uL (ref 150–400)
RBC: 2.71 MIL/uL — ABNORMAL LOW (ref 3.87–5.11)
RDW: 16.7 % — ABNORMAL HIGH (ref 11.5–15.5)
WBC: 7.5 K/uL (ref 4.0–10.5)
nRBC: 0 % (ref 0.0–0.2)

## 2024-01-29 LAB — COMPREHENSIVE METABOLIC PANEL WITH GFR
ALT: 29 U/L (ref 0–44)
AST: 39 U/L (ref 15–41)
Albumin: 3.2 g/dL — ABNORMAL LOW (ref 3.5–5.0)
Alkaline Phosphatase: 71 U/L (ref 38–126)
Anion gap: 11 (ref 5–15)
BUN: <5 mg/dL — ABNORMAL LOW (ref 8–23)
CO2: 21 mmol/L — ABNORMAL LOW (ref 22–32)
Calcium: 8.7 mg/dL — ABNORMAL LOW (ref 8.9–10.3)
Chloride: 103 mmol/L (ref 98–111)
Creatinine, Ser: 0.4 mg/dL — ABNORMAL LOW (ref 0.44–1.00)
GFR, Estimated: >60 mL/min
Glucose, Bld: 91 mg/dL (ref 70–99)
Potassium: 4 mmol/L (ref 3.5–5.1)
Sodium: 135 mmol/L (ref 135–145)
Total Bilirubin: 0.9 mg/dL (ref 0.0–1.2)
Total Protein: 5.9 g/dL — ABNORMAL LOW (ref 6.5–8.1)

## 2024-01-29 LAB — MAGNESIUM: Magnesium: 1.9 mg/dL (ref 1.7–2.4)

## 2024-01-29 MED ORDER — CEFADROXIL 500 MG PO CAPS
1000.0000 mg | ORAL_CAPSULE | Freq: Two times a day (BID) | ORAL | Status: AC
  Administered 2024-01-29 – 2024-01-30 (×4): 1000 mg via ORAL
  Filled 2024-01-29 (×4): qty 2

## 2024-01-29 MED ORDER — ACETAMINOPHEN 500 MG PO TABS
500.0000 mg | ORAL_TABLET | Freq: Three times a day (TID) | ORAL | Status: DC
  Administered 2024-01-29 – 2024-01-30 (×4): 500 mg via ORAL
  Filled 2024-01-29 (×5): qty 1

## 2024-01-29 NOTE — Progress Notes (Signed)
 " PROGRESS NOTE    Wendy [name]  FMW:[id number] DOB: 1955/04/11 DOA: 01/26/2024 PCP: Pcp, No    Chief Complaint  Patient presents with   Fall    Brief Narrative:  69 year old female with history of rectal adenocarcinoma status postresection and radiation, tobacco and alcohol abuse, history of hepatitis C, chronic diastolic CHF, COPD, hypertension, migraine headaches, TIA/CVA, hyperlipidemia and recent mechanical fall 4 days prior to presentation resulting in left proximal humerus fracture presented with altered mental status.  On presentation, UA was suggestive of UTI; UDS positive for opiates and THC.  WBC of 16.37, AST of 94 and ALT of 51 and total bili of 4.8.  lactic acid was 2.3.  Left shoulder x-ray showed acute displaced left proximal humerus fracture.  Pelvis x-ray showed no evidence of traumatic injury.  Chest x-ray showed no acute cardiopulmonary findings.  CT head showed no acute intracranial abnormality.  CT C-spine showed no acute traumatic injuries.  Orthopedics was consulted.  She was started on IV fluids and antibiotics.     Assessment & Plan:   Principal Problem:   Sepsis secondary to UTI [facility]) Active Problems:   Chronic respiratory failure with hypoxia (HCC)   Hypokalemia   Hyponatremia   High anion gap metabolic acidosis   Normocytic anemia   Closed left humeral fracture   Acidosis  #1 sepsis secondary to UTI -Patient on admission was noted to have met criteria for sepsis with leukocytosis, elevated lactic acid level.  Urinalysis concerning for possible UTI. -Blood cultures with no growth to date. - Urine cultures negative. - Transition from IV Rocephin  to Duricef to complete an empiric 5-day course of antibiotic treatment. - Supportive care.  2.  Acute metabolic encephalopathy -Likely secondary to probable UTI. - CT head negative for an acute abnormality. -Clinical improvement.  3.  Closed left humeral fracture/mechanical fall -Secondary to mechanical  fall. - Patient seen in consultation by orthopedics who are recommending conservative management for now. - NWB to left upper extremity in sling with outpatient follow-up with orthopedics. - Continue current pain management. - PT/OT.  4.  Dysphagia -Nursing staff noted to have reported issues with swallowing pills. - Patient seen by SLP who assessed patient and recommended continuation of regular diet with thin liquids and may need esophageal workup either in the inpatient versus outpatient. - Outpatient follow-up.  5.  Leukocytosis -Resolved.  6.  Anemia of chronic disease - Hemoglobin at 8.7 this morning.   - Follow.   7.  Hyponatremia - Improved.   8.  Hypokalemia - Repleted.   - Potassium of 4  9.  Acute metabolic acidosis -Improved.  10.  Elevated LFTs -Improving. - Follow-up.  11.  Hypertension - Continue lisinopril  10 mg daily.  - Outpatient follow-up  12.  Hyperlipidemia -Continue to hold statin due to elevated LFTs. - Could likely resume statin 1 week postdischarge as LFTs trending down.   -Outpatient follow-up.   DVT prophylaxis: SCDs Code Status: Full Family Communication: Updated patient.  No family at bedside. Disposition: SNF when clinically improved hopefully in the next 24 to 48 hours.  Status is: Inpatient Remains inpatient appropriate because: Severity of illness   Consultants:  Orthopedics: Dr. Teresa 01/26/2024  Procedures:  CT head CT C-spine 01/26/2024 Chest x-ray 01/26/2024 Plain films of the left elbow 01/26/2024 Plain films of the pelvis 01/26/2024 Plain films of the left shoulder 01/26/2024  Antimicrobials:  Anti-infectives (From admission, onward)    Start     Dose/Rate Route Frequency Ordered  Stop   01/29/24 1000  cefadroxil  (DURICEF) capsule 1,000 mg        1,000 mg Oral 2 times daily 01/29/24 0840 01/31/24 0959   01/28/24 1000  cefTRIAXone  (ROCEPHIN ) 2 g in sodium chloride  0.9 % 100 mL IVPB  Status:  Discontinued        2  g 200 mL/hr over 30 Minutes Intravenous Every 24 hours 01/28/24 0841 01/29/24 0840   01/27/24 1000  cefTRIAXone  (ROCEPHIN ) 1 g in sodium chloride  0.9 % 100 mL IVPB  Status:  Discontinued        1 g 200 mL/hr over 30 Minutes Intravenous Every 24 hours 01/26/24 2201 01/28/24 0841   01/26/24 0900  cefTRIAXone  (ROCEPHIN ) 1 g in sodium chloride  0.9 % 100 mL IVPB        1 g 200 mL/hr over 30 Minutes Intravenous  Once 01/26/24 0852 01/26/24 1036         Subjective: Patient sitting up in recliner.  Complaining of pain to the left shoulder.  Noted to have a ice pack on the left shoulder.  Denies any chest pain or shortness of breath.  Denies any dysuria.  Overall feeling better.  Objective: Vitals:   01/28/24 0528 01/28/24 1313 01/28/24 1957 01/29/24 0500  BP: (!) 146/66 (!) 161/91 (!) 146/89 139/70  Pulse: 79 87 77 65  Resp: 18 18 14    Temp: 97.9 F (36.6 C) 97.8 F (36.6 C) 98.2 F (36.8 C) 98 F (36.7 C)  TempSrc:  Oral Oral Oral  SpO2: 98% 97% 97% 97%  Weight:    43.8 kg  Height:        Intake/Output Summary (Last 24 hours) at 01/29/2024 1129 Last data filed at 01/29/2024 0539 Gross per 24 hour  Intake 920 ml  Output 1000 ml  Net -80 ml   Filed Weights   01/26/24 1821 01/28/24 0500 01/29/24 0500  Weight: 41.9 kg 45 kg 43.8 kg    Examination:  General exam: NAD.  Left upper extremity in sling.  Respiratory system: CTAB.  No wheezes, no crackles, no rhonchi.  Fair air movement.  Speaking in full sentences.  No use of accessory muscles of respiration.  Cardiovascular system: Regular rate rhythm no murmurs rubs or gallops.  No JVD.  No lower extremity edema.  Gastrointestinal system: Abdomen is soft, nontender, nondistended, positive bowel sounds.  No rebound.  No guarding.   Central nervous system: Alert and oriented. No focal neurological deficits. Extremities: Left upper extremity in sling. Skin: No rashes, lesions or ulcers Psychiatry: Judgement and insight appear  normal. Mood & affect appropriate.     Data Reviewed: I have personally reviewed following labs and imaging studies  CBC: Recent Labs  Lab 01/26/24 0659 01/27/24 0552 01/28/24 0557 01/29/24 0547  WBC 16.3* 6.2 5.9 7.5  NEUTROABS 15.1* 5.3 4.6 6.1  HGB 9.5* 9.2* 8.0* 8.7*  HCT 28.7* 27.7* 23.2* 27.0*  MCV 98.3 96.5 95.9 99.6  PLT 334 320 314 337    Basic Metabolic Panel: Recent Labs  Lab 01/26/24 0652 01/26/24 0659 01/27/24 0552 01/28/24 0557 01/29/24 0547  NA  --  130* 136 134* 135  K  --  2.8* 2.8* 3.2* 4.0  CL  --  88* 100 102 103  CO2  --  17* 19* 24 21*  GLUCOSE  --  88 92 99 91  BUN  --  10 9 <5* <5*  CREATININE  --  0.89 0.51 0.31* 0.40*  CALCIUM   --  9.4  8.6* 8.0* 8.7*  MG 1.7  --  1.9 1.9 1.9    GFR: Estimated Creatinine Clearance: 47.8 mL/min (A) (by C-G formula based on SCr of 0.4 mg/dL (L)).  Liver Function Tests: Recent Labs  Lab 01/26/24 0659 01/27/24 0552 01/28/24 0557 01/29/24 0547  AST 94* 88* 45* 39  ALT 51* 39 28 29  ALKPHOS 121 88 71 71  BILITOT 1.8* 0.9 0.7 0.9  PROT 7.1 5.7* 5.2* 5.9*  ALBUMIN 3.9 3.1* 2.7* 3.2*    CBG: Recent Labs  Lab 01/26/24 0756  GLUCAP 100*     Recent Results (from the past 240 hours)  Blood culture (routine x 2)     Status: None (Preliminary result)   Collection Time: 01/26/24  7:06 AM   Specimen: BLOOD  Result Value Ref Range Status   Specimen Description   Final    BLOOD SITE NOT SPECIFIED Performed at [facility], [address], [place], [place] [address]    Special Requests   Final    BOTTLES DRAWN AEROBIC AND ANAEROBIC Blood Culture results may not be optimal due to an inadequate volume of blood received in culture bottles Performed at [facility], [address] [place], [place] [address]    Culture   Final    NO GROWTH 3 DAYS Performed at [facility], [address], [place], [place] [address]    Report Status PENDING  Incomplete   Urine Culture     Status: None   Collection Time: 01/26/24  7:55 AM   Specimen: Urine, Catheterized  Result Value Ref Range Status   Specimen Description   Final    URINE, CATHETERIZED Performed at [facility], [address] [place], [place] [address]    Special Requests   Final    NONE Performed at [facility], [address], [place], [place] [address]    Culture   Final    NO GROWTH Performed at [facility], [address], [place], [place] [address]    Report Status 01/27/2024 FINAL  Final  Blood culture (routine x 2)     Status: None (Preliminary result)   Collection Time: 01/26/24 10:48 AM   Specimen: BLOOD  Result Value Ref Range Status   Specimen Description   Final    BLOOD SITE NOT SPECIFIED Performed at [facility], [address], [place], [place] [address]    Special Requests   Final    BOTTLES DRAWN AEROBIC AND ANAEROBIC Blood Culture adequate volume Performed at [facility], [address] [place], [place] [address]    Culture   Final    NO GROWTH 3 DAYS Performed at [facility], [address], [place], [place] [address]    Report Status PENDING  Incomplete         Radiology Studies: No results found.      Scheduled Meds:  aspirin  EC  81 mg Oral Daily   cefadroxil   1,000 mg Oral BID   lisinopril   10 mg Oral Daily   pantoprazole   40 mg Oral Daily   Continuous Infusions:     LOS: 3 days    Time spent: 35 minutes    Toribio Hummer, MD [facility]   To contact the attending provider between 7A-7P or the covering provider during after hours 7P-7A, please log into the web site [email or web address] and access using universal [place] password for that web  site. If you do not have the password, please call the hospital operator.  01/29/2024, 11:29 AM    "

## 2024-01-29 NOTE — Plan of Care (Signed)

## 2024-01-29 NOTE — TOC Progression Note (Signed)
 Transition of Care [facility]) - Progression Note    Patient Details  Name: Wendy [name] MRN: [id number] Date of Birth: 08-Mar-1955  Transition of Care [facility]) CM/SW Contact  Chanise Habeck, Nathanel, RN Phone Number: 01/29/2024, 3:33 PM  Clinical Narrative: await choice for ST SNF from patient who defers to son/[name](Son) left vm if choice made-await call back.      Expected Discharge Plan: Skilled Nursing Facility Barriers to Discharge: Continued Medical Work up               Expected Discharge Plan and Services   Discharge Planning Services: CM Consult Post Acute Care Choice: Skilled Nursing Facility Living arrangements for the past 2 months: Single Family Home                                       Social Drivers of Health (SDOH) Interventions SDOH Screenings   Food Insecurity: Food Insecurity Present (01/26/2024)  Housing: High Risk (01/26/2024)  Transportation Needs: No Transportation Needs (01/26/2024)  Utilities: At Risk (01/26/2024)  Social Connections: Socially Isolated (01/26/2024)  Tobacco Use: High Risk (01/26/2024)    Readmission Risk Interventions    01/27/2024   11:50 AM 11/17/2023    8:29 AM  Readmission Risk Prevention Plan  Post Dischage Appt  Complete  Medication Screening  Complete  Transportation Screening Complete Complete  Medication Review (RN Care Manager) Complete   PCP or Specialist appointment within 3-5 days of discharge Complete   SW Recovery Care/Counseling Consult Complete   Palliative Care Screening Not Applicable   Skilled Nursing Facility Complete

## 2024-01-29 NOTE — Plan of Care (Signed)
  Problem: Coping: Goal: Level of anxiety will decrease Outcome: Progressing   Problem: Elimination: Goal: Will not experience complications related to bowel motility Outcome: Progressing   Problem: Pain Managment: Goal: General experience of comfort will improve and/or be controlled Outcome: Progressing   Problem: Safety: Goal: Ability to remain free from injury will improve Outcome: Progressing

## 2024-01-29 NOTE — Progress Notes (Signed)
 Physical Therapy Treatment Patient Details Name: Wendy [name] MRN: [id number] DOB: June 20, 1955 Today's Date: 01/29/2024   History of Present Illness 69 y.o. female who was seen in the emergency department 4 days prior to admission due to having a mechanical fall landing on her left side, fracturing her proximal humerus who was brought to the emergency department via EMS 01/26/23 after being found on the floor. Dx sepsis, UTI. Pt with medical history significant of rectal adenocarcinoma alcohol dependency, seasonal allergies, anxiety, chronic hep C, HSV-1 and HSV-2, history of DVT of the right tubular vein, hypertension, migraine headaches, history of TIA/CVA    PT Comments  PT - Cognition Comments: Pt appears AxO x 3 however is very impulsive with poor reguard to self safety/self ability.  Pt also presents with Dysarthria.  Hx ETOH abuse. Assisted OOB to [facility] was eventful.  General bed mobility comments: 50% VC's on self strategy as well as use of rail.  50% VC's on safety L shoulder awareness, restrictions. General transfer comment: VERY impulsive requiring 75% VC's on safety.  Getting up immediately even after instructions to wait.  Assisted on/off BSC per pt request to void required Max VC's to wait while Therapist turned away to get a wash cloth.  Pt continued to stand up.  During standing, instructed Pt to hold to rail during peri care.  Pt kept letting go and attempting to move.  Poor safety cognition and awareness.  HIGH FALL RISK. General Gait Details: VERY unsteady gait requiring 100% VC's to slow down, correct forward flexed posture, safety with turns and proper use of cane.  Severe anterior weight shift/posture.  Very unsteady gait with poor self correction/awareness.  HIGH FALL RISK. Left pt in recliner with chair alarm active.  LPT has rec Pt will need ST Rehab at SNF to address mobility and functional decline prior to safely returning home.    If plan is discharge home, recommend  the following: A little help with walking and/or transfers;A little help with bathing/dressing/bathroom;Assistance with cooking/housework;Assist for transportation;Help with stairs or ramp for entrance   Can travel by private vehicle     Yes  Equipment Recommendations  None recommended by PT    Recommendations for Other Services       Precautions / Restrictions Precautions Precaution/Restrictions Comments: impulsive/multiple falls Required Braces or Orthoses: Sling Restrictions Weight Bearing Restrictions Per Provider Order: Yes LUE Weight Bearing Per Provider Order: Non weight bearing     Mobility  Bed Mobility Overal bed mobility: Needs Assistance Bed Mobility: Supine to Sit     Supine to sit: Supervision, HOB elevated     General bed mobility comments: 50% VC's on self strategy as well as use of rail.  50% VC's on safety L shoulder awareness, restrictions.    Transfers Overall transfer level: Needs assistance Equipment used: 1 person hand held assist Transfers: Sit to/from Stand Sit to Stand: Min assist, Mod assist           General transfer comment: VERY impulsive requiring 75% VC's on safety.  Getting up immediately even after instructions to wait.  Assisted on/off BSC per pt request to void required Max VC's to wait while Therapist turned away to get a wash cloth.  Pt continued to stand up.  During standing, instructed Pt to hold to rail during peri care.  Pt kept letting go and attempting to move.  Poor safety cognition and awareness.  HIGH FALL RISK.    Ambulation/Gait Ambulation/Gait assistance: Contact guard assist, Min assist,  Mod assist Gait Distance (Feet): 43 Feet Assistive device: Straight cane Gait Pattern/deviations: Step-through pattern, Decreased stride length, Drifts right/left Gait velocity: too fast     General Gait Details: VERY unsteady gait requiring 100% VC's to slow down, correct forward flexed posture, safety with turns and  proper use of cane.  Severe anterior weight shift/posture.  Very unsteady gait with poor self correction/awareness.  HIGH FALL RISK.   Stairs             Wheelchair Mobility     Tilt Bed    Modified Rankin (Stroke Patients Only)       Balance                                            Communication Communication Communication: Impaired Factors Affecting Communication: Reduced clarity of speech;Difficulty expressing self  Cognition Arousal: Alert Behavior During Therapy: Impulsive   PT - Cognitive impairments: Problem solving, Safety/Judgement, Awareness                       PT - Cognition Comments: Pt appears AxO x 3 however is very impulsive with poor reguard to self safety/self ability.  Pt also presents with Dysarthria.  Hx ETOH abuse. Following commands: Impaired Following commands impaired: Follows one step commands inconsistently    Cueing Cueing Techniques: Verbal cues, Gestural cues, Tactile cues  Exercises      General Comments        Pertinent Vitals/Pain Pain Assessment Pain Assessment: No/denies pain    Home Living                          Prior Function            PT Goals (current goals can now be found in the care plan section) Progress towards PT goals: Progressing toward goals    Frequency    Min 2X/week      PT Plan      Co-evaluation              AM-PAC PT 6 Clicks Mobility   Outcome Measure  Help needed turning from your back to your side while in a flat bed without using bedrails?: A Little Help needed moving from lying on your back to sitting on the side of a flat bed without using bedrails?: A Little Help needed moving to and from a bed to a chair (including a wheelchair)?: A Little Help needed standing up from a chair using your arms (e.g., wheelchair or bedside chair)?: A Little Help needed to walk in hospital room?: A Lot Help needed climbing 3-5 steps with a  railing? : A Lot 6 Click Score: 16    End of Session Equipment Utilized During Treatment: Gait belt Activity Tolerance: Patient tolerated treatment well Patient left: in chair;with call bell/phone within reach;with chair alarm set Nurse Communication: Mobility status PT Visit Diagnosis: Difficulty in walking, not elsewhere classified (R26.2);Repeated falls (R29.6);History of falling (Z91.81);Unsteadiness on feet (R26.81)     Time: 8985-8961 PT Time Calculation (min) (ACUTE ONLY): 24 min  Charges:    $Gait Training: 8-22 mins $Therapeutic Activity: 8-22 mins PT General Charges $$ ACUTE PT VISIT: 1 Visit                     Katheryn Leap  PTA Acute  Rehabilitation Services Office M-F          [phone]

## 2024-01-30 DIAGNOSIS — S42292P Other displaced fracture of upper end of left humerus, subsequent encounter for fracture with malunion: Secondary | ICD-10-CM | POA: Diagnosis not present

## 2024-01-30 DIAGNOSIS — N390 Urinary tract infection, site not specified: Secondary | ICD-10-CM

## 2024-01-30 DIAGNOSIS — E871 Hypo-osmolality and hyponatremia: Secondary | ICD-10-CM | POA: Diagnosis not present

## 2024-01-30 DIAGNOSIS — A419 Sepsis, unspecified organism: Principal | ICD-10-CM

## 2024-01-30 DIAGNOSIS — E872 Acidosis, unspecified: Secondary | ICD-10-CM | POA: Diagnosis not present

## 2024-01-30 DIAGNOSIS — D649 Anemia, unspecified: Secondary | ICD-10-CM

## 2024-01-30 DIAGNOSIS — E876 Hypokalemia: Secondary | ICD-10-CM

## 2024-01-30 MED ORDER — GUAIFENESIN ER 600 MG PO TB12
1200.0000 mg | ORAL_TABLET | Freq: Two times a day (BID) | ORAL | Status: DC
  Administered 2024-01-30 (×2): 1200 mg via ORAL
  Filled 2024-01-30 (×3): qty 2

## 2024-01-30 MED ORDER — FUROSEMIDE 20 MG PO TABS
20.0000 mg | ORAL_TABLET | Freq: Every day | ORAL | Status: DC
  Administered 2024-01-30 – 2024-02-03 (×5): 20 mg via ORAL
  Filled 2024-01-30 (×5): qty 1

## 2024-01-30 MED ORDER — KETOROLAC TROMETHAMINE 30 MG/ML IJ SOLN
30.0000 mg | Freq: Four times a day (QID) | INTRAMUSCULAR | Status: DC | PRN
  Administered 2024-01-30 – 2024-01-31 (×2): 30 mg via INTRAVENOUS
  Filled 2024-01-30 (×2): qty 1

## 2024-01-30 MED ORDER — METHOCARBAMOL 500 MG PO TABS
500.0000 mg | ORAL_TABLET | Freq: Three times a day (TID) | ORAL | Status: DC | PRN
  Administered 2024-01-30 – 2024-02-02 (×7): 500 mg via ORAL
  Filled 2024-01-30 (×7): qty 1

## 2024-01-30 MED ORDER — SENNOSIDES-DOCUSATE SODIUM 8.6-50 MG PO TABS
1.0000 | ORAL_TABLET | Freq: Two times a day (BID) | ORAL | Status: DC
  Administered 2024-01-30 – 2024-02-02 (×4): 1 via ORAL
  Filled 2024-01-30 (×8): qty 1

## 2024-01-30 NOTE — Progress Notes (Signed)
 Mobility Specialist Progress Note:   01/30/24 1132  Mobility  Activity Pivoted/transferred from chair to bed  Level of Assistance Minimal assist, patient does 75% or more  Assistive Device  (HHA)  Distance Ambulated (ft) 3 ft  LUE Weight Bearing Per Provider Order WBAT  Activity Response Tolerated fair  Mobility Referral Yes  Mobility visit 1 Mobility  Mobility Specialist Start Time (ACUTE ONLY) 1053  Mobility Specialist Stop Time (ACUTE ONLY) 1102  Mobility Specialist Time Calculation (min) (ACUTE ONLY) 9 min   Pt was received in recliner and agreed to mobility. Min A sit to stand. Pt stated body pains upon returning to bed. Left in room with RN. All needs met.   [employer] - Mobility Specialist

## 2024-01-30 NOTE — TOC Progression Note (Signed)
 Transition of Care [facility]) - Progression Note    Patient Details  Name: Wendy [name] MRN: [id number] Date of Birth: December 19, 1955  Transition [facility] Care [facility]) CM/SW Contact  Roseana Rhine, Nathanel, RN Phone Number: 01/30/2024, 3:27 PM  Clinical Narrative:spoke to patient/[name](Son) chose [facility] rep [facility] will have bed available Monday-no auth needed. MD updated.       Expected Discharge Plan: Skilled Nursing Facility Barriers to Discharge: Continued Medical Work up               Expected Discharge Plan and Services   Discharge Planning Services: CM Consult Post Acute Care Choice: Skilled Nursing Facility Living arrangements for the past 2 months: Single Family Home                                       Social Drivers of Health (SDOH) Interventions SDOH Screenings   Food Insecurity: Food Insecurity Present (01/26/2024)  Housing: High Risk (01/26/2024)  Transportation Needs: No Transportation Needs (01/26/2024)  Utilities: At Risk (01/26/2024)  Social Connections: Socially Isolated (01/26/2024)  Tobacco Use: High Risk (01/26/2024)    Readmission Risk Interventions    01/27/2024   11:50 AM 11/17/2023    8:29 AM  Readmission Risk Prevention Plan  Post Dischage Appt  Complete  Medication Screening  Complete  Transportation Screening Complete Complete  Medication Review (RN Care Manager) Complete   PCP or Specialist appointment within 3-5 days of discharge Complete   SW Recovery Care/Counseling Consult Complete   Palliative Care Screening Not Applicable   Skilled Nursing Facility Complete

## 2024-01-30 NOTE — Progress Notes (Signed)
 " PROGRESS NOTE    Wendy [name]  FMW:[id number] DOB: August 13, 1955 DOA: 01/26/2024 PCP: Pcp, No    Chief Complaint  Patient presents with   Fall    Brief Narrative:  69 year old female with history of rectal adenocarcinoma status postresection and radiation, tobacco and alcohol abuse, history of hepatitis C, chronic diastolic CHF, COPD, hypertension, migraine headaches, TIA/CVA, hyperlipidemia and recent mechanical fall 4 days prior to presentation resulting in left proximal humerus fracture presented with altered mental status.  On presentation, UA was suggestive of UTI; UDS positive for opiates and THC.  WBC of 16.37, AST of 94 and ALT of 51 and total bili of 4.8.  lactic acid was 2.3.  Left shoulder x-ray showed acute displaced left proximal humerus fracture.  Pelvis x-ray showed no evidence of traumatic injury.  Chest x-ray showed no acute cardiopulmonary findings.  CT head showed no acute intracranial abnormality.  CT C-spine showed no acute traumatic injuries.  Orthopedics was consulted.  She was started on IV fluids and antibiotics.     Assessment & Plan:   Principal Problem:   Sepsis secondary to UTI [facility]) Active Problems:   Chronic respiratory failure with hypoxia (HCC)   Hypokalemia   Hyponatremia   High anion gap metabolic acidosis   Normocytic anemia   Closed left humeral fracture   Acidosis  #1 sepsis secondary to UTI -Patient on admission was noted to have met criteria for sepsis with leukocytosis, elevated lactic acid level.  Urinalysis concerning for possible UTI. -Blood cultures with no growth to date. - Urine cultures negative. - Was on IV Rocephin  and has been transitioned to Duricef to complete an empiric 5-day course of antibiotic treatment.  - Supportive care.  2.  Acute metabolic encephalopathy -Likely secondary to probable UTI. - CT head negative for an acute abnormality. -Clinical improvement.  3.  Closed left humeral fracture/mechanical  fall -Secondary to mechanical fall. - Patient seen in consultation by orthopedics who are recommending conservative management for now. - NWB to left upper extremity in sling with outpatient follow-up with orthopedics. - Continue current pain management of Tylenol  500 mg 3 times daily, oxycodone  10 mg every 6 hours as needed. -IV Toradol  30 mg every 6 hours as needed. -Start Robaxin  500 mg p.o. every 8 hours as needed. - PT/OT.  4.  Dysphagia -Nursing staff noted to have reported issues with swallowing pills. - Patient seen by SLP who assessed patient and recommended continuation of regular diet with thin liquids and may need esophageal workup either in the inpatient versus outpatient. - Outpatient follow-up.  5.  Leukocytosis -Resolved.  6.  Anemia of chronic disease - Hemoglobin stable at 8.7.  - Follow.   7.  Hyponatremia - Improved.   8.  Hypokalemia - Repleted.   - Potassium of 4 - Monitor with resumption of home regimen diuretics per  9.  Acute metabolic acidosis -Improved.  10.  Elevated LFTs -Improving. - Follow-up.  11.  Hypertension - Continue lisinopril  10 mg daily.  -Resume home regimen Lasix  20 mg daily. - Outpatient follow-up  12.  Hyperlipidemia -Continue to hold statin due to elevated LFTs. - Could likely resume statin 1 week postdischarge as LFTs trending down.   -Outpatient follow-up.   DVT prophylaxis: SCDs Code Status: Full Family Communication: Updated patient and son at bedside. Disposition: Patient medically stable.  Awaiting SNF placement.   Status is: Inpatient Remains inpatient appropriate because: Severity of illness   Consultants:  Orthopedics: Dr. Teresa 01/26/2024  Procedures:  CT head CT C-spine 01/26/2024 Chest x-ray 01/26/2024 Plain films of the left elbow 01/26/2024 Plain films of the pelvis 01/26/2024 Plain films of the left shoulder 01/26/2024  Antimicrobials:  Anti-infectives (From admission, onward)    Start      Dose/Rate Route Frequency Ordered Stop   01/29/24 1000  cefadroxil  (DURICEF) capsule 1,000 mg        1,000 mg Oral 2 times daily 01/29/24 0840 01/31/24 0959   01/28/24 1000  cefTRIAXone  (ROCEPHIN ) 2 g in sodium chloride  0.9 % 100 mL IVPB  Status:  Discontinued        2 g 200 mL/hr over 30 Minutes Intravenous Every 24 hours 01/28/24 0841 01/29/24 0840   01/27/24 1000  cefTRIAXone  (ROCEPHIN ) 1 g in sodium chloride  0.9 % 100 mL IVPB  Status:  Discontinued        1 g 200 mL/hr over 30 Minutes Intravenous Every 24 hours 01/26/24 2201 01/28/24 0841   01/26/24 0900  cefTRIAXone  (ROCEPHIN ) 1 g in sodium chloride  0.9 % 100 mL IVPB        1 g 200 mL/hr over 30 Minutes Intravenous  Once 01/26/24 0852 01/26/24 1036         Subjective: Patient sitting up in bed getting changed.  Denies any significant chest pain or shortness of breath.  No abdominal pain.  Son at bedside.   Objective: Vitals:   01/29/24 1434 01/29/24 1938 01/30/24 0426 01/30/24 0447  BP: (!) 169/72 (!) 168/75 (!) 154/71   Pulse: 74 76 63   Resp: 17 17 16    Temp: 97.9 F (36.6 C) 98.3 F (36.8 C) 97.9 F (36.6 C)   TempSrc: Oral     SpO2: 96% 96% 98%   Weight:    43 kg  Height:        Intake/Output Summary (Last 24 hours) at 01/30/2024 1153 Last data filed at 01/30/2024 0800 Gross per 24 hour  Intake 400 ml  Output --  Net 400 ml   Filed Weights   01/28/24 0500 01/29/24 0500 01/30/24 0447  Weight: 45 kg 43.8 kg 43 kg    Examination:  General exam: NAD.  Left upper extremity in sling.  Respiratory system: Lungs clear to auscultation bilaterally.  No wheezes, no crackles, no rhonchi.  Fair air movement.  Speaking in full sentences.  Cardiovascular system: RRR no murmurs rubs or gallops.  No JVD.  No lower extremity edema.  Gastrointestinal system: Abdomen soft, nontender, nondistended, positive bowel sounds.  No rebound.  No guarding.  Central nervous system: Alert and oriented. No focal neurological  deficits. Extremities: Left upper extremity in sling. Skin: No rashes, lesions or ulcers Psychiatry: Judgement and insight appear normal. Mood & affect appropriate.     Data Reviewed: I have personally reviewed following labs and imaging studies  CBC: Recent Labs  Lab 01/26/24 0659 01/27/24 0552 01/28/24 0557 01/29/24 0547  WBC 16.3* 6.2 5.9 7.5  NEUTROABS 15.1* 5.3 4.6 6.1  HGB 9.5* 9.2* 8.0* 8.7*  HCT 28.7* 27.7* 23.2* 27.0*  MCV 98.3 96.5 95.9 99.6  PLT 334 320 314 337    Basic Metabolic Panel: Recent Labs  Lab 01/26/24 0652 01/26/24 0659 01/27/24 0552 01/28/24 0557 01/29/24 0547  NA  --  130* 136 134* 135  K  --  2.8* 2.8* 3.2* 4.0  CL  --  88* 100 102 103  CO2  --  17* 19* 24 21*  GLUCOSE  --  88 92 99 91  BUN  --  10 9 <5* <5*  CREATININE  --  0.89 0.51 0.31* 0.40*  CALCIUM   --  9.4 8.6* 8.0* 8.7*  MG 1.7  --  1.9 1.9 1.9    GFR: Estimated Creatinine Clearance: 47 mL/min (A) (by C-G formula based on SCr of 0.4 mg/dL (L)).  Liver Function Tests: Recent Labs  Lab 01/26/24 0659 01/27/24 0552 01/28/24 0557 01/29/24 0547  AST 94* 88* 45* 39  ALT 51* 39 28 29  ALKPHOS 121 88 71 71  BILITOT 1.8* 0.9 0.7 0.9  PROT 7.1 5.7* 5.2* 5.9*  ALBUMIN 3.9 3.1* 2.7* 3.2*    CBG: Recent Labs  Lab 01/26/24 0756  GLUCAP 100*     Recent Results (from the past 240 hours)  Blood culture (routine x 2)     Status: None (Preliminary result)   Collection Time: 01/26/24  7:06 AM   Specimen: BLOOD  Result Value Ref Range Status   Specimen Description   Final    BLOOD SITE NOT SPECIFIED Performed at [facility], [address] [place], [place] [address]    Special Requests   Final    BOTTLES DRAWN AEROBIC AND ANAEROBIC Blood Culture results may not be optimal due to an inadequate volume of blood received in culture bottles Performed at [facility], [address] [place], [place] [address]    Culture   Final    NO  GROWTH 4 DAYS Performed at [facility], [address], [place], [place] [address]    Report Status PENDING  Incomplete  Urine Culture     Status: None   Collection Time: 01/26/24  7:55 AM   Specimen: Urine, Catheterized  Result Value Ref Range Status   Specimen Description   Final    URINE, CATHETERIZED Performed at [facility], [address], [place], [place] [address]    Special Requests   Final    NONE Performed at [facility], [address], [place], [place] [address]    Culture   Final    NO GROWTH Performed at [facility], [address] [place], [place] [address]    Report Status 01/27/2024 FINAL  Final  Blood culture (routine x 2)     Status: None (Preliminary result)   Collection Time: 01/26/24 10:48 AM   Specimen: BLOOD  Result Value Ref Range Status   Specimen Description   Final    BLOOD SITE NOT SPECIFIED Performed at [facility], [address] [place], [place] [address]    Special Requests   Final    BOTTLES DRAWN AEROBIC AND ANAEROBIC Blood Culture adequate volume Performed at [facility], [address] [place], [place] [address]    Culture   Final    NO GROWTH 4 DAYS Performed at [facility], [address], [place], [place] [address]    Report Status PENDING  Incomplete         Radiology Studies: DG ESOPHAGUS W SINGLE CM (SOL OR THIN BA) Result Date: 01/29/2024 CLINICAL DATA:  69 year old female admitted with AMS, urosepsis, left humeral fracture, with noted dysphagia to solids, liquids, and pills, while inpatient. EXAM: ESOPHAGUS/BARIUM SWALLOW/TABLET STUDY TECHNIQUE: Single contrast examination was performed using thin liquid barium. This exam was performed by Carlin Griffon, PA-C, and was supervised and interpreted by Dr. Ryan Salvage, MD. FLUOROSCOPY: Radiation Exposure Index (as provided by the fluoroscopic device): 8.50 mGy Kerma  COMPARISON:  CT  cervical spine without contrast on 01/26/2024. FINDINGS: Swallowing: Appears normal. No vestibular penetration or aspiration seen. Pharynx: Moderately dilated oropharynx (Series 2, image 32/52). Esophagus: Suboptimal characterization of the distal esophagus related to dysmotility. No definite stricture identified. Esophageal motility: Disruption of primary peristaltic waves in the upper to midthoracic esophagus with poor transit into the stomach, compatible with nonspecific esophageal dysmotility disorder. Hiatal Hernia: None. Gastroesophageal reflux: None visualized despite provocative maneuvers. Ingested 13mm barium tablet: Patient unable to swallow the 13 mm barium tablet despite multiple attempts. Other: Study is severely limited by patient's inability to position appropriately for imaging, cause for limitations include the patient's displaced left humeral surgical neck fracture. Patient only able to position with table tilted past 45 degrees, and in AP position. IMPRESSION: 1. Nonspecific esophageal dysmotility disorder. 2. Patient unable to swallow barium pill despite attempts. 3. Reduced sensitivity of exam due to limitations with associated with patient positioning. Electronically Signed   By: Ryan Salvage M.D.   On: 01/29/2024 13:51        Scheduled Meds:  acetaminophen   500 mg Oral TID   aspirin  EC  81 mg Oral Daily   cefadroxil   1,000 mg Oral BID   lisinopril   10 mg Oral Daily   pantoprazole   40 mg Oral Daily   Continuous Infusions:     LOS: 4 days    Time spent: 35 minutes    Toribio Hummer, MD [facility]   To contact the attending provider between 7A-7P or the covering provider during after hours 7P-7A, please log into the web site [email or web address] and access using universal [place] password for that web site. If you do not have the password, please call the hospital operator.  01/30/2024, 11:53 AM    "

## 2024-01-30 NOTE — Progress Notes (Signed)
 Occupational Therapy Treatment Patient Details Name: Wendy [name] MRN: [id number] DOB: Apr 23, 1955 Today's Date: 01/30/2024   History of present illness 69 y.o. female who was seen in the emergency department 4 days prior to admission due to having a mechanical fall landing on her left side, fracturing her proximal humerus who was brought to the emergency department via EMS 01/26/23 after being found on the floor. Dx sepsis, UTI. Pt with medical history significant of rectal adenocarcinoma alcohol dependency, seasonal allergies, anxiety, chronic hep C, HSV-1 and HSV-2, history of DVT of the right tubular vein, hypertension, migraine headaches, history of TIA/CVA   OT comments  Patient seen for skilled OT session this am and open to all therapy presented. Addressed safety for toileting, ADL's and progressed standing with oral care focusing on compensatory strategies and falls prevention. Patient will benefit from continued inpatient follow up therapy, <3 hours/day. Patient requires continued Acute care hospital level OT services to progress safety and functional performance and allow for discharge.        If plan is discharge home, recommend the following:  A lot of help with walking and/or transfers;A lot of help with bathing/dressing/bathroom   Equipment Recommendations  BSC/3in1       Precautions / Restrictions Precautions Precautions: Fall Recall of Precautions/Restrictions: Impaired Precaution/Restrictions Comments: impulsive/multiple falls Required Braces or Orthoses: Sling Restrictions Weight Bearing Restrictions Per Provider Order: Yes LUE Weight Bearing Per Provider Order: Non weight bearing       Mobility Bed Mobility Overal bed mobility: Needs Assistance Bed Mobility: Supine to Sit     Supine to sit: Supervision, HOB elevated     General bed mobility comments: vc's due to safety    Transfers Overall transfer level: Needs assistance Equipment used: 1 person hand  held assist Transfers: Sit to/from Stand, Bed to chair/wheelchair/BSC Sit to Stand: Min assist     Step pivot transfers: Min assist, From elevated surface     General transfer comment: cues for safety, impulsivity, high falls risk     Balance Overall balance assessment: Needs assistance Sitting-balance support: No upper extremity supported, Feet supported Sitting balance-Leahy Scale: Fair     Standing balance support: Single extremity supported Standing balance-Leahy Scale: Poor                             ADL either performed or assessed with clinical judgement   ADL Overall ADL's : Needs assistance/impaired Eating/Feeding: Set up;Sitting   Grooming: Standing;Oral care;Wash/dry face;Minimal assistance;Sitting;Cueing for compensatory techniques;Cueing for sequencing;Cueing for safety   Upper Body Bathing: Maximal assistance;Sitting;Cueing for compensatory techniques;Cueing for sequencing   Lower Body Bathing: Moderate assistance;Sitting/lateral leans   Upper Body Dressing : Maximal assistance;Cueing for compensatory techniques;Cueing for UE precautions;Cueing for sequencing Upper Body Dressing Details (indicate cue type and reason): incl sling mngt Lower Body Dressing: Moderate assistance;Sitting/lateral leans;Sit to/from stand Lower Body Dressing Details (indicate cue type and reason): impulsive at times, using R hand only Toilet Transfer: Minimal assistance;Ambulation;Regular Toilet;Cueing for sequencing;Cueing for safety   Toileting- Clothing Manipulation and Hygiene: Minimal assistance;Sitting/lateral lean       Functional mobility during ADLs: Minimal assistance (gait belt HHA R UE) General ADL Comments: impulsive but eager and cooperative    Extremity/Trunk Assessment Upper Extremity Assessment Upper Extremity Assessment: Right hand dominant;LUE deficits/detail;Generalized weakness LUE Deficits / Details: L hand edematous, in sling readjusted by OT for  optimal fit. Pt reports L hand does not work when asked to [employer]  grasp/extension LUE Coordination: decreased fine motor   Lower Extremity Assessment Lower Extremity Assessment: Defer to PT evaluation        Vision   Vision Assessment?: No apparent visual deficits         Communication Communication Communication: Impaired Factors Affecting Communication: Reduced clarity of speech;Difficulty expressing self   Cognition Arousal: Alert Behavior During Therapy: Impulsive Cognition: Cognition impaired     Awareness: Online awareness impaired Memory impairment (select all impairments): Short-term memory Attention impairment (select first level of impairment): Selective attention Executive functioning impairment (select all impairments): Sequencing, Reasoning, Problem solving OT - Cognition Comments: impulsive with limited safety and judgement                 Following commands: Impaired Following commands impaired: Follows one step commands inconsistently      Cueing   Cueing Techniques: Verbal cues, Gestural cues, Tactile cues        General Comments bruising and edema L UE, RA 99% SpO2    Pertinent Vitals/ Pain       Pain Assessment Pain Assessment: No/denies pain   Frequency  Min 2X/week        Progress Toward Goals  OT Goals(current goals can now be found in the care plan section)  Progress towards OT goals: Progressing toward goals  Acute Rehab OT Goals Patient Stated Goal: to be stronger OT Goal Formulation: With patient Time For Goal Achievement: 02/10/24 Potential to Achieve Goals: Good ADL Goals Pt Will Perform Grooming: with contact guard assist;standing Pt Will Perform Lower Body Bathing: sit to/from stand;sitting/lateral leans;with contact guard assist Pt Will Transfer to Toilet: with contact guard assist;ambulating Pt Will Perform Toileting - Clothing Manipulation and hygiene: with min assist;sitting/lateral leans;sit to/from  stand Additional ADL Goal #1: Pt to verbalize at least 3 fall prevention strategies to implement in home environment  Plan         AM-PAC OT 6 Clicks Daily Activity     Outcome Measure   Help from another person eating meals?: A Little Help from another person taking care of personal grooming?: A Little Help from another person toileting, which includes using toliet, bedpan, or urinal?: A Lot Help from another person bathing (including washing, rinsing, drying)?: A Lot Help from another person to put on and taking off regular upper body clothing?: A Lot Help from another person to put on and taking off regular lower body clothing?: A Lot 6 Click Score: 14    End of Session Equipment Utilized During Treatment: Gait belt;Other (comment) (sling)  OT Visit Diagnosis: Unsteadiness on feet (R26.81);Other abnormalities of gait and mobility (R26.89);History of falling (Z91.81)   Activity Tolerance Patient tolerated treatment well   Patient Left in chair;with call bell/phone within reach;with chair alarm set   Nurse Communication Mobility status;Other (comment) (IV appeared to be unstable)        Time: 1030-1052 OT Time Calculation (min): 22 min  Charges: OT General Charges $OT Visit: 1 Visit OT Treatments $Self Care/Home Management : 8-22 mins Raywood Wailes OT/L Acute Rehabilitation Department  [phone]  01/30/2024, 11:22 AM

## 2024-01-30 NOTE — Plan of Care (Signed)

## 2024-01-30 NOTE — Care Management Important Message (Signed)
 Important Message  Patient Details IM Letter given. Name: Wendy [name] MRN: [id number] Date of Birth: 1955-10-18   Important Message Given:  Yes - Medicare IM     Kc Summerson 01/30/2024, 4:00 PM

## 2024-01-31 DIAGNOSIS — A419 Sepsis, unspecified organism: Secondary | ICD-10-CM | POA: Diagnosis not present

## 2024-01-31 DIAGNOSIS — S42292P Other displaced fracture of upper end of left humerus, subsequent encounter for fracture with malunion: Secondary | ICD-10-CM | POA: Diagnosis not present

## 2024-01-31 DIAGNOSIS — E876 Hypokalemia: Secondary | ICD-10-CM | POA: Diagnosis not present

## 2024-01-31 DIAGNOSIS — E872 Acidosis, unspecified: Secondary | ICD-10-CM | POA: Diagnosis not present

## 2024-01-31 DIAGNOSIS — D649 Anemia, unspecified: Secondary | ICD-10-CM | POA: Diagnosis not present

## 2024-01-31 DIAGNOSIS — N390 Urinary tract infection, site not specified: Secondary | ICD-10-CM | POA: Diagnosis not present

## 2024-01-31 DIAGNOSIS — E871 Hypo-osmolality and hyponatremia: Secondary | ICD-10-CM | POA: Diagnosis not present

## 2024-01-31 LAB — CULTURE, BLOOD (ROUTINE X 2)
Culture: NO GROWTH
Culture: NO GROWTH
Special Requests: ADEQUATE

## 2024-01-31 LAB — CBC
HCT: 29.2 % — ABNORMAL LOW (ref 36.0–46.0)
Hemoglobin: 9.8 g/dL — ABNORMAL LOW (ref 12.0–15.0)
MCH: 33 pg (ref 26.0–34.0)
MCHC: 33.6 g/dL (ref 30.0–36.0)
MCV: 98.3 fL (ref 80.0–100.0)
Platelets: 463 K/uL — ABNORMAL HIGH (ref 150–400)
RBC: 2.97 MIL/uL — ABNORMAL LOW (ref 3.87–5.11)
RDW: 17 % — ABNORMAL HIGH (ref 11.5–15.5)
WBC: 8.2 K/uL (ref 4.0–10.5)
nRBC: 0 % (ref 0.0–0.2)

## 2024-01-31 LAB — BASIC METABOLIC PANEL WITH GFR
Anion gap: 12 (ref 5–15)
BUN: 6 mg/dL — ABNORMAL LOW (ref 8–23)
CO2: 28 mmol/L (ref 22–32)
Calcium: 8.8 mg/dL — ABNORMAL LOW (ref 8.9–10.3)
Chloride: 97 mmol/L — ABNORMAL LOW (ref 98–111)
Creatinine, Ser: 0.41 mg/dL — ABNORMAL LOW (ref 0.44–1.00)
GFR, Estimated: >60 mL/min
Glucose, Bld: 92 mg/dL (ref 70–99)
Potassium: 3.4 mmol/L — ABNORMAL LOW (ref 3.5–5.1)
Sodium: 136 mmol/L (ref 135–145)

## 2024-01-31 MED ORDER — POTASSIUM CHLORIDE 20 MEQ PO PACK
40.0000 meq | PACK | Freq: Once | ORAL | Status: AC
  Administered 2024-01-31: 40 meq via ORAL
  Filled 2024-01-31: qty 2

## 2024-01-31 MED ORDER — GUAIFENESIN 100 MG/5ML PO LIQD: 20.0000 mL | Freq: Four times a day (QID) | ORAL | Status: DC

## 2024-01-31 MED ORDER — ACETAMINOPHEN 500 MG PO TABS
1000.0000 mg | ORAL_TABLET | Freq: Three times a day (TID) | ORAL | Status: DC
  Administered 2024-01-31 – 2024-02-03 (×11): 1000 mg via ORAL
  Filled 2024-01-31 (×10): qty 2

## 2024-01-31 MED ORDER — KETOROLAC TROMETHAMINE 30 MG/ML IJ SOLN
30.0000 mg | Freq: Four times a day (QID) | INTRAMUSCULAR | Status: DC | PRN
  Administered 2024-02-01: 30 mg via INTRAVENOUS
  Filled 2024-01-31: qty 1

## 2024-01-31 MED ORDER — POTASSIUM CHLORIDE CRYS ER 10 MEQ PO TBCR: 40.0000 meq | EXTENDED_RELEASE_TABLET | Freq: Once | ORAL | Status: DC

## 2024-01-31 MED ORDER — GUAIFENESIN 100 MG/5ML PO LIQD
20.0000 mL | ORAL | Status: DC
  Administered 2024-01-31 – 2024-02-03 (×15): 20 mL via ORAL
  Filled 2024-01-31 (×15): qty 20

## 2024-01-31 MED ORDER — ALUM & MAG HYDROXIDE-SIMETH 200-200-20 MG/5ML PO SUSP
15.0000 mL | Freq: Four times a day (QID) | ORAL | Status: DC | PRN
  Administered 2024-01-31: 15 mL via ORAL
  Filled 2024-01-31: qty 30

## 2024-01-31 NOTE — Progress Notes (Signed)
 " PROGRESS NOTE    Wendy [name]  FMW:[id number] DOB: 11-Dec-1955 DOA: 01/26/2024 PCP: Pcp, No    Chief Complaint  Patient presents with   Fall    Brief Narrative:  69 year old female with history of rectal adenocarcinoma status postresection and radiation, tobacco and alcohol abuse, history of hepatitis C, chronic diastolic CHF, COPD, hypertension, migraine headaches, TIA/CVA, hyperlipidemia and recent mechanical fall 4 days prior to presentation resulting in left proximal humerus fracture presented with altered mental status.  On presentation, UA was suggestive of UTI; UDS positive for opiates and THC.  WBC of 16.37, AST of 94 and ALT of 51 and total bili of 4.8.  lactic acid was 2.3.  Left shoulder x-ray showed acute displaced left proximal humerus fracture.  Pelvis x-ray showed no evidence of traumatic injury.  Chest x-ray showed no acute cardiopulmonary findings.  CT head showed no acute intracranial abnormality.  CT C-spine showed no acute traumatic injuries.  Orthopedics was consulted.  She was started on IV fluids and antibiotics.     Assessment & Plan:   Principal Problem:   Sepsis secondary to UTI [facility]) Active Problems:   Chronic respiratory failure with hypoxia (HCC)   Hypokalemia   Hyponatremia   High anion gap metabolic acidosis   Normocytic anemia   Closed left humeral fracture   Acidosis  #1 sepsis secondary to UTI -Patient on admission was noted to have met criteria for sepsis with leukocytosis, elevated lactic acid level.  Urinalysis concerning for possible UTI. -Blood cultures with no growth to date. - Urine cultures negative. - Was on IV Rocephin  and has been transitioned to Duricef to complete an empiric 5-day course of antibiotic treatment.  - Supportive care.  2.  Acute metabolic encephalopathy -Likely secondary to probable UTI. - CT head negative for an acute abnormality. -Clinical improvement. - Likely close to baseline  3.  Closed left humeral  fracture/mechanical fall -Secondary to mechanical fall. - Patient seen in consultation by orthopedics who are recommending conservative management for now. - NWB to left upper extremity in sling with outpatient follow-up with orthopedics. - Increase Tylenol  to 1000 mg 3 times daily.   - Continue oxycodone  10 mg every 6 hours as needed, Robaxin  500 mg every 8 hours as needed. - Continue IV Toradol  30 mg every 6 hours as needed. - PT/OT. - Outpatient follow-up with orthopedics.  4.  Dysphagia -Nursing staff noted to have reported issues with swallowing pills. - Patient seen by SLP who assessed patient and recommended continuation of regular diet with thin liquids and may need esophageal workup either in the inpatient versus outpatient. - Outpatient follow-up.  5.  Leukocytosis -Resolved.  6.  Anemia of chronic disease - Hemoglobin stable at 9.8. - Follow.   7.  Hyponatremia - Improved.   8.  Hypokalemia - Potassium 3.4 . - KCl 40 mEq p.o. x 1.   9.  Acute metabolic acidosis - Resolved.    10.  Elevated LFTs - Improved.  11.  Hypertension - Continue lisinopril  10 mg daily, Lasix  20 mg daily.   - Outpatient follow-up.   12.  Hyperlipidemia -Continue to hold statin due to elevated LFTs. - Could likely resume statin 1 week postdischarge as LFTs trending down.   -Outpatient follow-up.   DVT prophylaxis: SCDs Code Status: Full Family Communication: Updated patient, no family at bedside. Disposition: Patient medically stable.  Awaiting SNF placement.   Status is: Inpatient Remains inpatient appropriate because: Severity of illness   Consultants:  Orthopedics: Dr. Teresa 01/26/2024  Procedures:  CT head CT C-spine 01/26/2024 Chest x-ray 01/26/2024 Plain films of the left elbow 01/26/2024 Plain films of the pelvis 01/26/2024 Plain films of the left shoulder 01/26/2024  Antimicrobials:  Anti-infectives (From admission, onward)    Start     Dose/Rate Route Frequency  Ordered Stop   01/29/24 1000  cefadroxil  (DURICEF) capsule 1,000 mg        1,000 mg Oral 2 times daily 01/29/24 0840 01/30/24 2155   01/28/24 1000  cefTRIAXone  (ROCEPHIN ) 2 g in sodium chloride  0.9 % 100 mL IVPB  Status:  Discontinued        2 g 200 mL/hr over 30 Minutes Intravenous Every 24 hours 01/28/24 0841 01/29/24 0840   01/27/24 1000  cefTRIAXone  (ROCEPHIN ) 1 g in sodium chloride  0.9 % 100 mL IVPB  Status:  Discontinued        1 g 200 mL/hr over 30 Minutes Intravenous Every 24 hours 01/26/24 2201 01/28/24 0841   01/26/24 0900  cefTRIAXone  (ROCEPHIN ) 1 g in sodium chloride  0.9 % 100 mL IVPB        1 g 200 mL/hr over 30 Minutes Intravenous  Once 01/26/24 0852 01/26/24 1036         Subjective: Patient sitting up in bed, RN at bedside.  Patient denies any chest pain no shortness of breath.  Feels left shoulder pain is better controlled today on current pain regimen.  Objective: Vitals:   01/30/24 2106 01/31/24 0320 01/31/24 0321 01/31/24 1311  BP: (!) 161/82 (!) 165/78  (!) 156/80  Pulse: 71 72  73  Resp: 20 18  16   Temp: 98.3 F (36.8 C) 97.9 F (36.6 C)  97.9 F (36.6 C)  TempSrc: Oral   Oral  SpO2: 99% 98%  97%  Weight:   42.4 kg   Height:        Intake/Output Summary (Last 24 hours) at 01/31/2024 1343 Last data filed at 01/31/2024 0900 Gross per 24 hour  Intake 1660 ml  Output 1100 ml  Net 560 ml   Filed Weights   01/29/24 0500 01/30/24 0447 01/31/24 0321  Weight: 43.8 kg 43 kg 42.4 kg    Examination:  General exam: NAD.  Left upper extremity in sling.  Respiratory system: CTAB.  No wheezes, no crackles, no rhonchi.  Fair air movement.  Speaking in full sentences.  Cardiovascular system: Regular rate rhythm no murmurs rubs or gallops.  No JVD.  No lower extremity edema.  Gastrointestinal system: Abdomen is soft, nontender, nondistended, positive bowel sounds.  No rebound.  No guarding.  Central nervous system: Alert and oriented. No focal neurological  deficits. Extremities: Left upper extremity in sling. Skin: No rashes, lesions or ulcers Psychiatry: Judgement and insight appear normal. Mood & affect appropriate.     Data Reviewed: I have personally reviewed following labs and imaging studies  CBC: Recent Labs  Lab 01/26/24 0659 01/27/24 0552 01/28/24 0557 01/29/24 0547 01/31/24 0527  WBC 16.3* 6.2 5.9 7.5 8.2  NEUTROABS 15.1* 5.3 4.6 6.1  --   HGB 9.5* 9.2* 8.0* 8.7* 9.8*  HCT 28.7* 27.7* 23.2* 27.0* 29.2*  MCV 98.3 96.5 95.9 99.6 98.3  PLT 334 320 314 337 463*    Basic Metabolic Panel: Recent Labs  Lab 01/26/24 0652 01/26/24 0659 01/27/24 0552 01/28/24 0557 01/29/24 0547 01/31/24 0527  NA  --  130* 136 134* 135 136  K  --  2.8* 2.8* 3.2* 4.0 3.4*  CL  --  88* 100 102 103 97*  CO2  --  17* 19* 24 21* 28  GLUCOSE  --  88 92 99 91 92  BUN  --  10 9 <5* <5* 6*  CREATININE  --  0.89 0.51 0.31* 0.40* 0.41*  CALCIUM   --  9.4 8.6* 8.0* 8.7* 8.8*  MG 1.7  --  1.9 1.9 1.9  --     GFR: Estimated Creatinine Clearance: 46.3 mL/min (A) (by C-G formula based on SCr of 0.41 mg/dL (L)).  Liver Function Tests: Recent Labs  Lab 01/26/24 0659 01/27/24 0552 01/28/24 0557 01/29/24 0547  AST 94* 88* 45* 39  ALT 51* 39 28 29  ALKPHOS 121 88 71 71  BILITOT 1.8* 0.9 0.7 0.9  PROT 7.1 5.7* 5.2* 5.9*  ALBUMIN 3.9 3.1* 2.7* 3.2*    CBG: Recent Labs  Lab 01/26/24 0756  GLUCAP 100*     Recent Results (from the past 240 hours)  Blood culture (routine x 2)     Status: None   Collection Time: 01/26/24  7:06 AM   Specimen: BLOOD  Result Value Ref Range Status   Specimen Description   Final    BLOOD SITE NOT SPECIFIED Performed at [facility], [address], [place], [place] [address]    Special Requests   Final    BOTTLES DRAWN AEROBIC AND ANAEROBIC Blood Culture results may not be optimal due to an inadequate volume of blood received in culture bottles Performed at [facility], [address], [place], [place] [address]    Culture   Final    NO GROWTH 5 DAYS Performed at [facility], [address], [place], [place] [address]    Report Status 01/31/2024 FINAL  Final  Urine Culture     Status: None   Collection Time: 01/26/24  7:55 AM   Specimen: Urine, Catheterized  Result Value Ref Range Status   Specimen Description   Final    URINE, CATHETERIZED Performed at [facility], [address], [place], [place] [address]    Special Requests   Final    NONE Performed at [facility], [address], [place], [place] [address]    Culture   Final    NO GROWTH Performed at [facility], [address], [place], [place] [address]    Report Status 01/27/2024 FINAL  Final  Blood culture (routine x 2)     Status: None   Collection Time: 01/26/24 10:48 AM   Specimen: BLOOD  Result Value Ref Range Status   Specimen Description   Final    BLOOD SITE NOT SPECIFIED Performed at [facility], [address], [place], [place] [address]    Special Requests   Final    BOTTLES DRAWN AEROBIC AND ANAEROBIC Blood Culture adequate volume Performed at [facility], [address], [place], [place] [address]    Culture   Final    NO GROWTH 5 DAYS Performed at [facility], [address] [place], [place] [address]    Report Status 01/31/2024 FINAL  Final         Radiology Studies: No results found.       Scheduled Meds:  acetaminophen   1,000 mg Oral TID   aspirin  EC  81 mg Oral Daily   furosemide   20 mg Oral Daily   guaiFENesin   20 mL Oral Q4H while awake   lisinopril   10 mg Oral Daily   pantoprazole   40 mg Oral Daily   senna-docusate  1 tablet Oral BID   Continuous Infusions:     LOS: 5 days    Time spent: 35 minutes    Toribio Hummer, MD [facility]   To contact the attending provider between 7A-7P or the covering provider  during after hours 7P-7A, please log into the web site [email or web address] and access using universal [place] password for that web site. If you do not have the password, please call the hospital operator.  01/31/2024, 1:43 PM    "

## 2024-02-01 DIAGNOSIS — E871 Hypo-osmolality and hyponatremia: Secondary | ICD-10-CM | POA: Diagnosis not present

## 2024-02-01 DIAGNOSIS — N390 Urinary tract infection, site not specified: Secondary | ICD-10-CM | POA: Diagnosis not present

## 2024-02-01 DIAGNOSIS — A419 Sepsis, unspecified organism: Secondary | ICD-10-CM | POA: Diagnosis not present

## 2024-02-01 DIAGNOSIS — E876 Hypokalemia: Secondary | ICD-10-CM | POA: Diagnosis not present

## 2024-02-01 DIAGNOSIS — S42292P Other displaced fracture of upper end of left humerus, subsequent encounter for fracture with malunion: Secondary | ICD-10-CM | POA: Diagnosis not present

## 2024-02-01 DIAGNOSIS — D649 Anemia, unspecified: Secondary | ICD-10-CM | POA: Diagnosis not present

## 2024-02-01 DIAGNOSIS — E872 Acidosis, unspecified: Secondary | ICD-10-CM | POA: Diagnosis not present

## 2024-02-01 LAB — BASIC METABOLIC PANEL WITH GFR
Anion gap: 12 (ref 5–15)
BUN: <5 mg/dL — ABNORMAL LOW (ref 8–23)
CO2: 25 mmol/L (ref 22–32)
Calcium: 8.7 mg/dL — ABNORMAL LOW (ref 8.9–10.3)
Chloride: 100 mmol/L (ref 98–111)
Creatinine, Ser: 0.47 mg/dL (ref 0.44–1.00)
GFR, Estimated: >60 mL/min
Glucose, Bld: 86 mg/dL (ref 70–99)
Potassium: 3.1 mmol/L — ABNORMAL LOW (ref 3.5–5.1)
Sodium: 136 mmol/L (ref 135–145)

## 2024-02-01 LAB — CBC
HCT: 28.6 % — ABNORMAL LOW (ref 36.0–46.0)
Hemoglobin: 9.5 g/dL — ABNORMAL LOW (ref 12.0–15.0)
MCH: 32.8 pg (ref 26.0–34.0)
MCHC: 33.2 g/dL (ref 30.0–36.0)
MCV: 98.6 fL (ref 80.0–100.0)
Platelets: 492 K/uL — ABNORMAL HIGH (ref 150–400)
RBC: 2.9 MIL/uL — ABNORMAL LOW (ref 3.87–5.11)
RDW: 17.2 % — ABNORMAL HIGH (ref 11.5–15.5)
WBC: 5 K/uL (ref 4.0–10.5)
nRBC: 0 % (ref 0.0–0.2)

## 2024-02-01 MED ORDER — HYDRALAZINE HCL 20 MG/ML IJ SOLN: 5.0000 mg | Freq: Once | INTRAMUSCULAR | Status: DC

## 2024-02-01 MED ORDER — POTASSIUM CHLORIDE CRYS ER 10 MEQ PO TBCR
40.0000 meq | EXTENDED_RELEASE_TABLET | ORAL | Status: AC
  Administered 2024-02-01 (×2): 40 meq via ORAL
  Filled 2024-02-01 (×2): qty 4

## 2024-02-01 MED ORDER — POTASSIUM CHLORIDE CRYS ER 10 MEQ PO TBCR
20.0000 meq | EXTENDED_RELEASE_TABLET | Freq: Every day | ORAL | Status: DC
  Administered 2024-02-02 – 2024-02-03 (×2): 20 meq via ORAL
  Filled 2024-02-01 (×2): qty 2

## 2024-02-01 NOTE — Progress Notes (Signed)
 Mobility Specialist - Progress Note   02/01/24 1249  Mobility  Activity Pivoted/transferred to/from [facility]  Level of Assistance Contact guard assist, steadying assist  Assistive Device Other (Comment) (HHA)  Distance Ambulated (ft) 3 ft  Range of Motion/Exercises Active  LUE Weight Bearing Per Provider Order NWB  Activity Response Tolerated well  Mobility visit 1 Mobility  Mobility Specialist Start Time (ACUTE ONLY) 1238  Mobility Specialist Stop Time (ACUTE ONLY) 1249  Mobility Specialist Time Calculation (min) (ACUTE ONLY) 11 min   Pt was found in bed and requested assistance to [facility]. Declined hallway ambulation. At EOS returned to bed with all needs met. Call bell in reach. RN notified.   Erminio Leos,  Mobility Specialist Can be reached via Secure Chat

## 2024-02-01 NOTE — Progress Notes (Signed)
 " PROGRESS NOTE    Wendy [name]  FMW:[id number] DOB: 13-Jun-1955 DOA: 01/26/2024 PCP: Pcp, No    Chief Complaint  Patient presents with   Fall    Brief Narrative:  69 year old female with history of rectal adenocarcinoma status postresection and radiation, tobacco and alcohol abuse, history of hepatitis C, chronic diastolic CHF, COPD, hypertension, migraine headaches, TIA/CVA, hyperlipidemia and recent mechanical fall 4 days prior to presentation resulting in left proximal humerus fracture presented with altered mental status.  On presentation, UA was suggestive of UTI; UDS positive for opiates and THC.  WBC of 16.37, AST of 94 and ALT of 51 and total bili of 4.8.  lactic acid was 2.3.  Left shoulder x-ray showed acute displaced left proximal humerus fracture.  Pelvis x-ray showed no evidence of traumatic injury.  Chest x-ray showed no acute cardiopulmonary findings.  CT head showed no acute intracranial abnormality.  CT C-spine showed no acute traumatic injuries.  Orthopedics was consulted.  She was started on IV fluids and antibiotics.     Assessment & Plan:   Principal Problem:   Sepsis secondary to UTI [facility]) Active Problems:   Chronic respiratory failure with hypoxia (HCC)   Hypokalemia   Hyponatremia   High anion gap metabolic acidosis   Normocytic anemia   Closed left humeral fracture   Acidosis  #1 sepsis secondary to UTI -Patient on admission was noted to have met criteria for sepsis with leukocytosis, elevated lactic acid level.  Urinalysis concerning for possible UTI. -Blood cultures with no growth to date. - Urine cultures negative. - Was on IV Rocephin  and has been transitioned to Duricef to complete an empiric 5-day course of antibiotic treatment.  - Supportive care.  2.  Acute metabolic encephalopathy -Likely secondary to probable UTI. - CT head negative for an acute abnormality. -Clinical improvement. - Likely close to baseline  3.  Closed left humeral  fracture/mechanical fall -Secondary to mechanical fall. - Patient seen in consultation by orthopedics who are recommending conservative management for now. - NWB to left upper extremity in sling with outpatient follow-up with orthopedics. - Increase Tylenol  to 1000 mg 3 times daily.   - Continue oxycodone  10 mg every 6 hours as needed, Robaxin  500 mg every 8 hours as needed. - Continue IV Toradol  30 mg every 6 hours as needed. - PT/OT. - Outpatient follow-up with orthopedics.  4.  Dysphagia -Nursing staff noted to have reported issues with swallowing pills. - Patient seen by SLP who assessed patient and recommended continuation of regular diet with thin liquids and may need esophageal workup either in the inpatient versus outpatient. - Outpatient follow-up.  5.  Leukocytosis -Resolved.  6.  Anemia of chronic disease - Hemoglobin stable at 9.5. - Follow.   7.  Hyponatremia - Improved.   8.  Hypokalemia - Potassium at 3.1  - Likely secondary to resumption of home regimen Lasix .   - Replete.    9.  Acute metabolic acidosis - Resolved.    10.  Elevated LFTs - Improved.  11.  Hypertension - Continue lisinopril  10 mg daily, Lasix  20 mg daily.   - Outpatient follow-up.    12.  Hyperlipidemia -Continue to hold statin due to elevated LFTs. - Could likely resume statin 1 week postdischarge as LFTs trending down.   -Outpatient follow-up.   DVT prophylaxis: SCDs Code Status: Full Family Communication: Updated patient, no family at bedside. Disposition: Patient medically stable.  Awaiting SNF placement.   Status is: Inpatient Remains inpatient  appropriate because: Severity of illness   Consultants:  Orthopedics: Dr. Teresa 01/26/2024  Procedures:  CT head CT C-spine 01/26/2024 Chest x-ray 01/26/2024 Plain films of the left elbow 01/26/2024 Plain films of the pelvis 01/26/2024 Plain films of the left shoulder 01/26/2024  Antimicrobials:  Anti-infectives (From admission,  onward)    Start     Dose/Rate Route Frequency Ordered Stop   01/29/24 1000  cefadroxil  (DURICEF) capsule 1,000 mg        1,000 mg Oral 2 times daily 01/29/24 0840 01/30/24 2155   01/28/24 1000  cefTRIAXone  (ROCEPHIN ) 2 g in sodium chloride  0.9 % 100 mL IVPB  Status:  Discontinued        2 g 200 mL/hr over 30 Minutes Intravenous Every 24 hours 01/28/24 0841 01/29/24 0840   01/27/24 1000  cefTRIAXone  (ROCEPHIN ) 1 g in sodium chloride  0.9 % 100 mL IVPB  Status:  Discontinued        1 g 200 mL/hr over 30 Minutes Intravenous Every 24 hours 01/26/24 2201 01/28/24 0841   01/26/24 0900  cefTRIAXone  (ROCEPHIN ) 1 g in sodium chloride  0.9 % 100 mL IVPB        1 g 200 mL/hr over 30 Minutes Intravenous  Once 01/26/24 0852 01/26/24 1036         Subjective: Patient sitting up in bed.  Denies any chest pain or shortness of breath.  States pain is currently controlled.   Objective: Vitals:   01/31/24 1954 01/31/24 2000 02/01/24 0426 02/01/24 0636  BP: (!) 163/72 (!) 155/79 (!) 143/68   Pulse: 71 78 76   Resp: 17 19 18    Temp: 97.8 F (36.6 C) 98.2 F (36.8 C) (!) 97.5 F (36.4 C)   TempSrc: Oral  Oral   SpO2: 97% 97% 97%   Weight:    41.7 kg  Height:        Intake/Output Summary (Last 24 hours) at 02/01/2024 1242 Last data filed at 02/01/2024 0511 Gross per 24 hour  Intake 240 ml  Output 400 ml  Net -160 ml   Filed Weights   01/30/24 0447 01/31/24 0321 02/01/24 0636  Weight: 43 kg 42.4 kg 41.7 kg    Examination:  General exam: NAD.  Left upper extremity in sling.  Respiratory system: CTAB.  No wheezes, no crackles, no rhonchi.  Fair air movement.  Speaking in full sentences. Cardiovascular system: RRR no murmurs rubs or gallops.  No JVD.  No lower extremity edema.  Gastrointestinal system: Abdomen is soft, nontender, nondistended, positive bowel sounds.  No rebound.  No guarding.  Central nervous system: Alert and oriented. No focal neurological deficits. Extremities: Left  upper extremity in sling. Skin: No rashes, lesions or ulcers Psychiatry: Judgement and insight appear normal. Mood & affect appropriate.     Data Reviewed: I have personally reviewed following labs and imaging studies  CBC: Recent Labs  Lab 01/26/24 0659 01/27/24 0552 01/28/24 0557 01/29/24 0547 01/31/24 0527 02/01/24 0537  WBC 16.3* 6.2 5.9 7.5 8.2 5.0  NEUTROABS 15.1* 5.3 4.6 6.1  --   --   HGB 9.5* 9.2* 8.0* 8.7* 9.8* 9.5*  HCT 28.7* 27.7* 23.2* 27.0* 29.2* 28.6*  MCV 98.3 96.5 95.9 99.6 98.3 98.6  PLT 334 320 314 337 463* 492*    Basic Metabolic Panel: Recent Labs  Lab 01/26/24 0652 01/26/24 0659 01/27/24 0552 01/28/24 0557 01/29/24 0547 01/31/24 0527 02/01/24 0537  NA  --    < > 136 134* 135 136 136  K  --    < >  2.8* 3.2* 4.0 3.4* 3.1*  CL  --    < > 100 102 103 97* 100  CO2  --    < > 19* 24 21* 28 25  GLUCOSE  --    < > 92 99 91 92 86  BUN  --    < > 9 <5* <5* 6* <5*  CREATININE  --    < > 0.51 0.31* 0.40* 0.41* 0.47  CALCIUM   --    < > 8.6* 8.0* 8.7* 8.8* 8.7*  MG 1.7  --  1.9 1.9 1.9  --   --    < > = values in this interval not displayed.    GFR: Estimated Creatinine Clearance: 45.5 mL/min (by C-G formula based on SCr of 0.47 mg/dL).  Liver Function Tests: Recent Labs  Lab 01/26/24 0659 01/27/24 0552 01/28/24 0557 01/29/24 0547  AST 94* 88* 45* 39  ALT 51* 39 28 29  ALKPHOS 121 88 71 71  BILITOT 1.8* 0.9 0.7 0.9  PROT 7.1 5.7* 5.2* 5.9*  ALBUMIN 3.9 3.1* 2.7* 3.2*    CBG: Recent Labs  Lab 01/26/24 0756  GLUCAP 100*     Recent Results (from the past 240 hours)  Blood culture (routine x 2)     Status: None   Collection Time: 01/26/24  7:06 AM   Specimen: BLOOD  Result Value Ref Range Status   Specimen Description   Final    BLOOD SITE NOT SPECIFIED Performed at [facility], [address], [place], [place] [address]    Special Requests   Final    BOTTLES DRAWN AEROBIC AND ANAEROBIC Blood Culture results  may not be optimal due to an inadequate volume of blood received in culture bottles Performed at [facility], [address] [place], [place] [address]    Culture   Final    NO GROWTH 5 DAYS Performed at [facility], [address] [place], [place] [address]    Report Status 01/31/2024 FINAL  Final  Urine Culture     Status: None   Collection Time: 01/26/24  7:55 AM   Specimen: Urine, Catheterized  Result Value Ref Range Status   Specimen Description   Final    URINE, CATHETERIZED Performed at [facility], [address], [place], [place] [address]    Special Requests   Final    NONE Performed at [facility], [address] [place], [place] [address]    Culture   Final    NO GROWTH Performed at [facility], [address] [place], [place] [address]    Report Status 01/27/2024 FINAL  Final  Blood culture (routine x 2)     Status: None   Collection Time: 01/26/24 10:48 AM   Specimen: BLOOD  Result Value Ref Range Status   Specimen Description   Final    BLOOD SITE NOT SPECIFIED Performed at [facility], [address] [place], [place] [address]    Special Requests   Final    BOTTLES DRAWN AEROBIC AND ANAEROBIC Blood Culture adequate volume Performed at [facility], [address], [place], [place] [address]    Culture   Final    NO GROWTH 5 DAYS Performed at [facility], [address] [place], [place] [address]    Report Status 01/31/2024 FINAL  Final         Radiology Studies: No results found.  Scheduled Meds:  acetaminophen   1,000 mg Oral TID   aspirin  EC  81 mg Oral Daily   furosemide   20 mg Oral Daily   guaiFENesin   20 mL Oral Q4H while awake   lisinopril   10 mg Oral Daily   pantoprazole   40 mg Oral Daily   [START ON 02/02/2024] potassium chloride   20 mEq Oral Daily   potassium chloride   40 mEq Oral Q4H   senna-docusate   1 tablet Oral BID   Continuous Infusions:     LOS: 6 days    Time spent: 35 minutes    Toribio Hummer, MD [facility]   To contact the attending provider between 7A-7P or the covering provider during after hours 7P-7A, please log into the web site [email or web address] and access using universal [place] password for that web site. If you do not have the password, please call the hospital operator.  02/01/2024, 12:42 PM    "

## 2024-02-02 DIAGNOSIS — N390 Urinary tract infection, site not specified: Secondary | ICD-10-CM

## 2024-02-02 LAB — BASIC METABOLIC PANEL WITH GFR
Anion gap: 11 (ref 5–15)
BUN: <5 mg/dL — ABNORMAL LOW (ref 8–23)
CO2: 24 mmol/L (ref 22–32)
Calcium: 8.7 mg/dL — ABNORMAL LOW (ref 8.9–10.3)
Chloride: 102 mmol/L (ref 98–111)
Creatinine, Ser: 0.45 mg/dL (ref 0.44–1.00)
GFR, Estimated: >60 mL/min
Glucose, Bld: 87 mg/dL (ref 70–99)
Potassium: 3.8 mmol/L (ref 3.5–5.1)
Sodium: 137 mmol/L (ref 135–145)

## 2024-02-02 MED ORDER — SENNOSIDES-DOCUSATE SODIUM 8.6-50 MG PO TABS: 1.0000 | ORAL_TABLET | Freq: Two times a day (BID) | ORAL | Status: AC

## 2024-02-02 MED ORDER — ACETAMINOPHEN 500 MG PO TABS: ORAL_TABLET | ORAL | Status: AC

## 2024-02-02 MED ORDER — POTASSIUM CHLORIDE CRYS ER 10 MEQ PO TBCR: 10.0000 meq | EXTENDED_RELEASE_TABLET | Freq: Every day | ORAL | Status: AC

## 2024-02-02 MED ORDER — OXYCODONE HCL 10 MG PO TABS: 10.0000 mg | ORAL_TABLET | Freq: Four times a day (QID) | ORAL | 0 refills | Status: DC | PRN

## 2024-02-02 MED ORDER — OXYCODONE HCL 5 MG PO TABS: 5.0000 mg | ORAL_TABLET | Freq: Four times a day (QID) | ORAL | 0 refills | Status: AC | PRN

## 2024-02-02 MED ORDER — TIZANIDINE HCL 4 MG PO TABS: 4.0000 mg | ORAL_TABLET | Freq: Three times a day (TID) | ORAL | 0 refills | Status: AC | PRN

## 2024-02-02 NOTE — Plan of Care (Signed)
  Problem: Fluid Volume: Goal: Hemodynamic stability will improve Outcome: Progressing   Problem: Clinical Measurements: Goal: Diagnostic test results will improve Outcome: Progressing   Problem: Clinical Measurements: Goal: Signs and symptoms of infection will decrease Outcome: Progressing   Problem: Respiratory: Goal: Ability to maintain adequate ventilation will improve Outcome: Progressing   Problem: Education: Goal: Knowledge of General Education information will improve Description: Including pain rating scale, medication(s)/side effects and non-pharmacologic comfort measures Outcome: Progressing

## 2024-02-02 NOTE — Progress Notes (Signed)
 Mobility Specialist - Progress Note  (RA) Pre-mobility: 78 bpm HR, 97% SpO2 During mobility: 100 bpm HR, 94% SpO2 Post-mobility: 77 bpm HR, 96% SPO2   02/02/24 1355  Mobility  Activity Ambulated with assistance  Level of Assistance Minimal assist, patient does 75% or more  Assistive Device Other (Comment) (HHA)  Distance Ambulated (ft) 100 ft  Range of Motion/Exercises Active  LUE Weight Bearing Per Provider Order NWB  Activity Response Tolerated well  Mobility visit 1 Mobility  Mobility Specialist Start Time (ACUTE ONLY) 1340  Mobility Specialist Stop Time (ACUTE ONLY) 1355  Mobility Specialist Time Calculation (min) (ACUTE ONLY) 15 min   Pt was found in bed and agreeable to mobilize. Requires safety cues. C/o pain towards EOS. At EOS returned to bed with all needs met. Call bell in reach.   Erminio Leos,  Mobility Specialist Can be reached via Secure Chat

## 2024-02-02 NOTE — Discharge Summary (Signed)
 Physician Discharge Summary  Wendy [name] FMW:[id number] DOB: Aug 19, 1955 DOA: 01/26/2024  PCP: Pcp, No  Admit date: 01/26/2024 Discharge date: 02/02/2024  Time spent: 60 minutes  Recommendations for Outpatient Follow-up:  Follow-up with MD at skilled nursing facility.  Patient will need a comprehensive metabolic profile done in 1 week to follow-up on electrolytes, renal function, LFTs. Follow-up with Dr. Teresa, orthopedics in 2 weeks. Follow-up with PCP in 2 to 3 weeks after discharge from skilled nursing facility.   Discharge Diagnoses:  Principal Problem:   Sepsis secondary to UTI [facility]) Active Problems:   Chronic respiratory failure with hypoxia (HCC)   Hypokalemia   Hyponatremia   High anion gap metabolic acidosis   Normocytic anemia   Closed left humeral fracture   Acidosis   Urinary tract infection with hematuria   Discharge Condition: Stable and improved.  Diet recommendation: Regular  Filed Weights   01/31/24 0321 02/01/24 0636 02/02/24 0519  Weight: 42.4 kg 41.7 kg 40.2 kg    History of present illness:  HPI per Dr. Celinda Apolinar Wendy [name] is a 69 y.o. female with medical history significant of rectal adenocarcinoma alcohol dependency, seasonal allergies, anxiety, chronic hep C, HSV-1 and HSV-2, history of DVT of the right tubular vein, hypertension, migraine headaches, history of TIA/CVA who was seen in the emergency department 4 days ago in the early morning due to having a mechanical fall landing on her left side, fracturing her proximal humerus who today was brought to the emergency department via EMS after being found on the floor by her husband.  She is able to answer simple questions, but is unable to elaborate on her HPI.  Denies headache, chest, back or abdominal pain at the time of my examination.   Lab work: Urinalysis showed moderate hemoglobin, small leukocyte esterase, ketones of 5 mg/dL, 88-79 WBC and rare bacteria.  UDS was positive for opiates and THC.   CBC showed white count of 16.3 with 94% neutrophils, hemoglobin 9.5 g/dL and platelets 665.  Normal PT and INR.  APTT was 44 seconds.  CMP showed a sodium of 130, potassium 2.8, chloride 88 and CO2 17 mmol/L with an anion gap of 25.  AST 94 and ALT 51 units/L.  Total bilirubin 1.8 mg/dL.  Normal glucose, renal function, calcium , total protein, albumin and alkaline phosphatase.  Magnesium  was 1.7 and alcohol less than 15 mg/dL.  Lactic acid is 2.3 mmol/L.   Imaging: Left shoulder x-ray show comminuted fracture of the left humeral neck and greater tuberosity with apex lateral angulation of 60 degrees and increased proximal displacement of the distal fracture fragments relative to the humeral head compared to 01/22/2024.  Diffuse soft tissue swelling.  Left elbow x-ray was limited due to positioning.  There is possible anterior joint effusion with equivocal cortical defect along the radial head.  Second left shoulder x-ray showed acute displaced left proximal humerus fracture with left glenohumeral alignment maintained.  Pelvics x-ray no evidence of traumatic injury.  Portable 1 view chest radiograph with no acute cardiopulmonary findings.  CT head without contrast with no traumatic injury or intracranial abnormality.  There is moderate through the advanced cerebral white matter disease, which seems stable when compared to previous imaging.  CT cervical spine with no acute traumatic injury.  There is a very advanced cervical spine degeneration with evidence of severe multifactorial cervical spinal stenosis at C5-C6.  Emphysema.   ED course: Initial vital signs were temperature 98 F, pulse 75, respiration 19, BP 104/79 mmHg  and O2 sat 94% on room air.  Patient received ceftriaxone  1 g IVPB, Benadryl  12.5 mg IVP, morphine  1 mg IVP, KCl 40 mEq p.o. x 1 KCl 10 mEq IVPB x 1 and 1500 mL of normal saline bolus.  Hospital Course:  #1 sepsis secondary to UTI -Patient on admission was noted to have met criteria for  sepsis with leukocytosis, elevated lactic acid level.  Urinalysis concerning for possible UTI. -Blood cultures with no growth to date. - Urine cultures negative. - Was on IV Rocephin  and transitioned to Duricef and completed 5-day course of antibiotic treatment during the hospitalization.  - No further antibiotics required on discharge.     2.  Acute metabolic encephalopathy -Likely secondary to probable UTI. - CT head negative for an acute abnormality. -Clinical improvement. - Likely close to baseline   3.  Closed left humeral fracture/mechanical fall -Secondary to mechanical fall. - Patient seen in consultation by orthopedics who recommended conservative management for now. - NWB to left upper extremity in sling with outpatient follow-up with orthopedics. - Patient placed on Tylenol  1000 mg 3 times daily.   - During hospitalization patient maintained oxycodone  10 mg every 6 hours as needed, Robaxin  500 mg every 8 hours as needed. -Patient also maintained on IV Toradol  as needed. -Patient was discharged to skilled nursing facility on oxycodone  5 to 10 mg every 6 hours as needed, Flexeril  as needed. - Outpatient follow-up with orthopedics.   4.  Dysphagia -Nursing staff noted to have reported issues with swallowing pills. - Patient seen by SLP who assessed patient and recommended continuation of regular diet with thin liquids and may need esophageal workup either in the inpatient versus outpatient. - Outpatient follow-up.   5.  Leukocytosis -Resolved.   6.  Anemia of chronic disease - Hemoglobin stabilized at 9.5 by day of discharge.   - Outpatient follow-up.   7.  Hyponatremia - Improved.    8.  Hypokalemia - Potassium at 3.1  - Likely secondary to resumption of home regimen Lasix .   - Patient will be placed on daily potassium supplementation of 10 mEq daily.   - Will need repeat labs in 1 week.    9.  Acute metabolic acidosis - Resolved.     10.  Elevated LFTs -  Improved.   11.  Hypertension - Patient maintained on lisinopril  10 mg daily, Lasix  20 mg daily with good blood pressure control.   - Outpatient follow-up with PCP.    12.  Hyperlipidemia - Patient statin held during the hospitalization due to elevated LFTs.   -It was noted per med rec that statin had been discontinued.  PCP.   - Statin will be held until follow-up with PCP for further recommendations.       Procedures: CT head CT C-spine 01/26/2024 Chest x-ray 01/26/2024 Plain films of the left elbow 01/26/2024 Plain films of the pelvis 01/26/2024 Plain films of the left shoulder 01/26/2024  Consultations: Orthopedics: Dr. Teresa 01/26/2024   Discharge Exam: Vitals:   02/02/24 0621 02/02/24 1400  BP: (!) 165/76 (!) 158/74  Pulse: 72 72  Resp:  14  Temp:  98.2 F (36.8 C)  SpO2: 96% 95%    General: NAD.  Cardiovascular: RRR no murmurs rubs or gallops.  No JVD.  No lower extremity edema Respiratory: Clear to auscultation bilaterally.  No wheezes, no crackles, no rhonchi.  Fair air movement.  Speaking in full sentences.  Discharge Instructions   Discharge Instructions     Diet  general   Complete by: As directed    Increase activity slowly   Complete by: As directed    No wound care   Complete by: As directed       Allergies as of 02/02/2024       Reactions   Amoxicillin Rash   Vancomycin Itching   Erythema and mild itching -  Resolved with IV Benadryl .   Erythromycin Base Hives, Other (See Comments)   Pt states it makes her shaky.        Medication List     PAUSE taking these medications    Morphine  Sulfate (Concentrate) 5 MG/0.25ML Soln Wait to take this until your doctor or other care provider tells you to start again. Take 0.25 mLs by mouth every 6 (six) hours as needed (Pain).   rosuvastatin  20 MG tablet Wait to take this until your doctor or other care provider tells you to start again. Commonly known as: CRESTOR  Take 1 tablet (20 mg total) by  mouth daily.       TAKE these medications    acetaminophen  500 MG tablet Commonly known as: TYLENOL  Take 2 tablets (1,000 mg total) by mouth 3 (three) times daily for 7 days, THEN 1 tablet (500 mg total) every 6 (six) hours as needed for mild pain (pain score 1-3) or headache. Start taking on: February 02, 2024 What changed:  medication strength See the new instructions.   albuterol  108 (90 Base) MCG/ACT inhaler Commonly known as: VENTOLIN  HFA Inhale 2 puffs into the lungs every 6 (six) hours as needed for wheezing or shortness of breath.   aspirin  EC 81 MG tablet Take 1 tablet (81 mg total) by mouth daily. Swallow whole.   furosemide  20 MG tablet Commonly known as: Lasix  Take 1 tablet (20 mg total) by mouth daily. What changed: Another medication with the same name was removed. Continue taking this medication, and follow the directions you see here.   lisinopril  10 MG tablet Commonly known as: ZESTRIL  Take 1 tablet (10 mg total) by mouth daily. What changed: Another medication with the same name was removed. Continue taking this medication, and follow the directions you see here.   oxyCODONE  5 MG immediate release tablet Commonly known as: Oxy IR/ROXICODONE  Take 1-2 tablets (5-10 mg total) by mouth every 6 (six) hours as needed for moderate pain (pain score 4-6). What changed:  how much to take reasons to take this   pantoprazole  40 MG tablet Commonly known as: PROTONIX  Take 1 tablet (40 mg total) by mouth daily.   potassium chloride  10 MEQ tablet Commonly known as: KLOR-CON  M Take 1 tablet (10 mEq total) by mouth daily. Start taking on: February 03, 2024   senna-docusate 8.6-50 MG tablet Commonly known as: Senokot-S Take 1 tablet by mouth 2 (two) times daily.   tiZANidine  4 MG tablet Commonly known as: Zanaflex  Take 1 tablet (4 mg total) by mouth every 8 (eight) hours as needed for muscle spasms.       Allergies[1]  Follow-up Information     Teresa Rankin ORN, MD Follow up in 2 week(s).   Specialty: Orthopedic Surgery Contact information: [address] [place]  [address] [phone]         MD AT SNF Follow up.          PCP. Schedule an appointment as soon as possible for a visit in 2 week(s).   Why: Follow-up in 2 to 3 weeks.  The results of significant diagnostics from this hospitalization (including imaging, microbiology, ancillary and laboratory) are listed below for reference.    Significant Diagnostic Studies: DG ESOPHAGUS W SINGLE CM (SOL OR THIN BA) Result Date: 01/29/2024 CLINICAL DATA:  69 year old female admitted with AMS, urosepsis, left humeral fracture, with noted dysphagia to solids, liquids, and pills, while inpatient. EXAM: ESOPHAGUS/BARIUM SWALLOW/TABLET STUDY TECHNIQUE: Single contrast examination was performed using thin liquid barium. This exam was performed by Carlin Griffon, PA-C, and was supervised and interpreted by Dr. Ryan Salvage, MD. FLUOROSCOPY: Radiation Exposure Index (as provided by the fluoroscopic device): 8.50 mGy Kerma COMPARISON:  CT cervical spine without contrast on 01/26/2024. FINDINGS: Swallowing: Appears normal. No vestibular penetration or aspiration seen. Pharynx: Moderately dilated oropharynx (Series 2, image 32/52). Esophagus: Suboptimal characterization of the distal esophagus related to dysmotility. No definite stricture identified. Esophageal motility: Disruption of primary peristaltic waves in the upper to midthoracic esophagus with poor transit into the stomach, compatible with nonspecific esophageal dysmotility disorder. Hiatal Hernia: None. Gastroesophageal reflux: None visualized despite provocative maneuvers. Ingested 13mm barium tablet: Patient unable to swallow the 13 mm barium tablet despite multiple attempts. Other: Study is severely limited by patient's inability to position appropriately for imaging, cause for limitations include the  patient's displaced left humeral surgical neck fracture. Patient only able to position with table tilted past 45 degrees, and in AP position. IMPRESSION: 1. Nonspecific esophageal dysmotility disorder. 2. Patient unable to swallow barium pill despite attempts. 3. Reduced sensitivity of exam due to limitations with associated with patient positioning. Electronically Signed   By: Ryan Salvage M.D.   On: 01/29/2024 13:51   DG Shoulder 1V Left Result Date: 01/26/2024 EXAM: 1 VIEW XRAY OF THE LEFT SHOULDER 01/26/2024 10:38:00 AM COMPARISON: Left humerus series 08/03/2024 07:21:00 PM. CLINICAL HISTORY: 69 year old female. Proximal humerus fracture. Found down on floor. FINDINGS: BONES AND JOINTS: Acute displaced fracture of the proximal humerus with mild apex anterior angulation. The left humeral head fragment remains normally aligned with the left glenoid. SOFT TISSUES: Visualized lung is unremarkable. IMPRESSION: 1. Acute displaced left proximal humerus fracture with Left glenohumeral alignment maintained. Electronically signed by: Helayne Hurst MD MD 01/26/2024 11:08 AM EST RP Workstation: [id number]   CT CERVICAL SPINE WO CONTRAST Result Date: 01/26/2024 EXAM: CT CERVICAL SPINE WITHOUT CONTRAST 01/26/2024 10:45:29 AM TECHNIQUE: CT of the cervical spine was performed WITHOUT the administration of intravenous contrast. Multiplanar reformatted images are provided for review. Automated exposure control, iterative reconstruction, and/or weight based adjustment of the mA/kV was utilized to reduce the radiation dose to as low as reasonably achievable. COMPARISON: CT cervical spine 01/22/2024. Head CT reported separately today. CLINICAL HISTORY: 69 year old female. Fall. Found down on floor. FINDINGS: BONES AND ALIGNMENT: Osteopenia appears age advanced with superimposed confluent and degenerative appearing endplate and vertebral body sclerosis in the mid and lower cervical spine. Stable alignment with degenerative  anterolisthesis of C3 on C4 and degenerative left facet ankylosis at that level. No acute fracture or traumatic malalignment. DEGENERATIVE CHANGES: Chronic severe disc and endplate degeneration C3-C4 through C6-C7. Vacuum disc at C5-C6. Degenerative cervical spinal stenosis suspected and likely severe at the C5-C6 level (series 13 image 58). SOFT TISSUES: Bulky multifocal calcified cervical carotid artery atherosclerosis. Otherwise negative visible non-contrast neck soft tissues. No prevertebral soft tissue swelling. LUNGS: Emphysema and mild scarring in the lung apices. IMPRESSION: 1. No acute traumatic injury identified in the cervical spine. 2. Very advanced cervical spine degeneration with evidence of SEVERE multifactorial cervical spinal stenosis at C5-C6.  3. Emphysema. Electronically signed by: Helayne Hurst MD 01/26/2024 11:06 AM EST RP Workstation: [id number]   CT HEAD WO CONTRAST ( ) Result Date: 01/26/2024 EXAM: CT HEAD WITHOUT CONTRAST 01/26/2024 10:45:29 AM TECHNIQUE: CT of the head was performed without the administration of intravenous contrast. Automated exposure control, iterative reconstruction, and/or weight based adjustment of the mA/kV was utilized to reduce the radiation dose to as low as reasonably achievable. COMPARISON: Head CT 01/22/2024. CLINICAL HISTORY: 69 year old female. Left-sided facial droop, possible stroke-like symptoms. Found down on floor. FINDINGS: BRAIN AND VENTRICLES: No acute hemorrhage. No evidence of acute infarct. No hydrocephalus. No extra-axial collection. No mass effect or midline shift. Brain volume stable and at the lower limits of normal for age. Moderate patchy bilateral cerebral white matter hypodensity is stable. Calcified atherosclerosis at the skull base. No suspicious intracranial vascular hyperdensity. ORBITS: No acute abnormality. SINUSES: Paranasal sinuses remain well aerated. SOFT TISSUES AND SKULL: No acute soft tissue abnormality. No skull fracture.  Tympanic cavities and mastoids remain well aerated. IMPRESSION: 1. No acute traumatic injury or intracranial abnormality identified. 2. Stable moderately advanced cerebral white matter disease. Electronically signed by: Helayne Hurst MD MD 01/26/2024 11:02 AM EST RP Workstation: [id number]   DG Elbow 2 Views Left Result Date: 01/26/2024 EXAM: 1 or 2 VIEW(S) XRAY OF THE LEFT ELBOW COMPARISON: 01/22/2024. CLINICAL HISTORY: fall, concern for recurrent injury FINDINGS: LIMITATIONS: Limited evaluation due to positioning. BONES AND JOINTS: Possible anterior joint effusion. Equivocal cortical defect along the radial head. Differential diagnosis for the cortical defect includes a non-displaced fracture, an old injury, or a normal variant. Follow-up imaging or clinical correlation is recommended. No malalignment. SOFT TISSUES: Unremarkable. IMPRESSION: 1. Limited evaluation due to positioning. 2. Possible anterior joint effusion with equivocal cortical defect along the radial head; recommend immobilization and follow-up radiographs in 7-10 days. Electronically signed by: Waddell Calk MD MD 01/26/2024 07:52 AM EST RP Workstation: [id number]   DG Shoulder Left Portable Result Date: 01/26/2024 EXAM: 1 VIEW(S) XRAY OF THE LEFT SHOULDER 01/26/2024 07:40:00 AM COMPARISON: 01/22/2024 CLINICAL HISTORY: known fracture, recurrent fall FINDINGS: BONES AND JOINTS: Comminuted fracture of the left humeral neck and greater tuberosity with apex lateral angulation of 60 degrees. Increased proximal displacement of the distal fracture fragments relative to the humeral head. The [facility] joint is unremarkable. SOFT TISSUES: Diffuse soft tissue swelling. No abnormal calcifications. Visualized lung is unremarkable. IMPRESSION: 1. Comminuted fracture of the left humeral neck and greater tuberosity with apex lateral angulation of 60 degrees and increased proximal displacement of the distal fracture fragments relative to the humeral head, compared to  01/22/2024. 2. Diffuse soft tissue swelling. Electronically signed by: Waddell Calk MD MD 01/26/2024 07:48 AM EST RP Workstation: [id number]   DG Chest Portable 1 View Result Date: 01/26/2024 EXAM: 1 VIEW(S) XRAY OF THE CHEST 01/26/2024 07:40:00 AM COMPARISON: 12/06/2023 CLINICAL HISTORY: fall FINDINGS: LUNGS AND PLEURA: No focal pulmonary opacity. No pleural effusion. No pneumothorax. HEART AND MEDIASTINUM: Aortic calcification. No acute abnormality of the cardiac and mediastinal silhouettes. BONES AND SOFT TISSUES: Acute displaced fracture of surgical neck of left proximal humerus. IMPRESSION: 1. Acute displaced fracture of the surgical neck of the left proximal humerus. 2. No acute cardiopulmonary findings. Electronically signed by: Waddell Calk MD MD 01/26/2024 07:45 AM EST RP Workstation: [id number]   DG Pelvis Portable Result Date: 01/26/2024 EXAM: 1 OR 2 VIEW(S) XRAY OF THE PELVIS 01/26/2024 07:40:00 AM COMPARISON: 01/22/2024 CLINICAL HISTORY: fall FINDINGS: BONES AND JOINTS: No acute fracture. No malalignment. SOFT TISSUES: Atherosclerotic plaque.  IMPRESSION: 1. No evidence of acute traumatic injury. Electronically signed by: Waddell Calk MD MD 01/26/2024 07:44 AM EST RP Workstation: [id number]   DG Elbow Complete Left Result Date: 01/22/2024 CLINICAL DATA:  Fall.  Pain. EXAM: LEFT ELBOW - COMPLETE 3+ VIEW COMPARISON:  None Available. FINDINGS: Exam is markedly limited by positioning. Within this limitation, no gross fracture or dislocation is evident. Assessment for joint effusion is not possible due to the inability to obtain a true 90 degree lateral projection. IMPRESSION: Markedly limited study due to positioning. Within this limitation, no gross fracture or dislocation is evident. Electronically Signed   By: Camellia Candle M.D.   On: 01/22/2024 06:55   CT Head Wo Contrast Result Date: 01/22/2024 EXAM: CT HEAD WITHOUT CONTRAST 01/22/2024 06:18:43 AM TECHNIQUE: CT of the head was performed  without the administration of intravenous contrast. Automated exposure control, iterative reconstruction, and/or weight based adjustment of the mA/kV was utilized to reduce the radiation dose to as low as reasonably achievable. COMPARISON: Cervical spine CT and head CT, both 10/04/2012. CLINICAL HISTORY: Head trauma, moderate-severe. Fall injury with head and neck trauma. FINDINGS: BRAIN AND VENTRICLES: No acute hemorrhage. No evidence of acute infarct. No extra-axial collection. No mass effect or midline shift. There is mild to moderate cerebral atrophy with moderate small vessel disease of the cerebral white matter with atrophic ventriculomegaly and mild cerebellar volume loss. Findings are progressed from 2014. ORBITS: No acute abnormality. SINUSES: The sinuses are clear. The nasal septum is s-shaped. SOFT TISSUES AND SKULL: No acute soft tissue abnormality. No skull fracture. There is patchy fluid in the right mastoid tip. The other bilateral mastoid air cells are clear. CERVICAL SPINE: The cervical spine demonstrates mild osteopenia without evidence of fractures or focal pathologic bone lesions. Similar to 2014 there is a grade 1 degenerative anterolisthesis at C3-C4 with left facet joint ankylosis, and minimal discogenic retrolisthesis at C4-C5, C5-C6, and C6-C7. There is no traumatic listhesis or new abnormal alignment. Narrowing and osteophytes are again noted of the anterior atlantodental joint. There is progressive degenerative disc collapse from C3-C4 through C6-C7 with increasingly prominent bidirectional endplate osteophytes. Normal disc heights at C2-C3 and C7-T1. Multilevel moderate to advanced facet hypertrophy, left greater than right, with uncinate joint spurring. Acquired foraminal stenosis is mild to moderate on the left at C2-C3, severe on the left at C3-C4, bilaterally severe at C4-C5, severe on the right greater than left at C5-C6, and severe on the right at C6-C7. At C2-C3 there is a small  disc protrusion without mass effect. At C5-C6 posterior osteophytes account for mild to moderate spinal canal stenosis with mild spondylotic cord compression. There is borderline cord compression by posterior osteophytes at C3-C4 and C4-C5. There are interval increased calcifications at both carotid bifurcations. There is no laryngeal or thyroid mass. LUNG APICES: In the lung apices, there is centrilobular emphysema and asymmetric linear scarring again on the right. No acute upper thoracic findings. IMPRESSION: 1. No acute intracranial CT findings or depressed skull fractures. 2. Small vessel changes and atrophy , progressed from 2014. 3. Patchy fluid in the right mastoid tip. 4. Osteopenia and degenerative changes cervical spine, also progressive. 5. Emphysema. The patient probably qualifies for a low-dose lung cancer screening CT program. Electronically signed by: Francis Quam MD 01/22/2024 06:52 AM EST RP Workstation: [id number]   CT Cervical Spine Wo Contrast Result Date: 01/22/2024 EXAM: CT HEAD WITHOUT CONTRAST 01/22/2024 06:18:43 AM TECHNIQUE: CT of the head was performed without the administration of intravenous contrast.  Automated exposure control, iterative reconstruction, and/or weight based adjustment of the mA/kV was utilized to reduce the radiation dose to as low as reasonably achievable. COMPARISON: Cervical spine CT and head CT, both 10/04/2012. CLINICAL HISTORY: Head trauma, moderate-severe. Fall injury with head and neck trauma. FINDINGS: BRAIN AND VENTRICLES: No acute hemorrhage. No evidence of acute infarct. No extra-axial collection. No mass effect or midline shift. There is mild to moderate cerebral atrophy with moderate small vessel disease of the cerebral white matter with atrophic ventriculomegaly and mild cerebellar volume loss. Findings are progressed from 2014. ORBITS: No acute abnormality. SINUSES: The sinuses are clear. The nasal septum is s-shaped. SOFT TISSUES AND SKULL: No acute  soft tissue abnormality. No skull fracture. There is patchy fluid in the right mastoid tip. The other bilateral mastoid air cells are clear. CERVICAL SPINE: The cervical spine demonstrates mild osteopenia without evidence of fractures or focal pathologic bone lesions. Similar to 2014 there is a grade 1 degenerative anterolisthesis at C3-C4 with left facet joint ankylosis, and minimal discogenic retrolisthesis at C4-C5, C5-C6, and C6-C7. There is no traumatic listhesis or new abnormal alignment. Narrowing and osteophytes are again noted of the anterior atlantodental joint. There is progressive degenerative disc collapse from C3-C4 through C6-C7 with increasingly prominent bidirectional endplate osteophytes. Normal disc heights at C2-C3 and C7-T1. Multilevel moderate to advanced facet hypertrophy, left greater than right, with uncinate joint spurring. Acquired foraminal stenosis is mild to moderate on the left at C2-C3, severe on the left at C3-C4, bilaterally severe at C4-C5, severe on the right greater than left at C5-C6, and severe on the right at C6-C7. At C2-C3 there is a small disc protrusion without mass effect. At C5-C6 posterior osteophytes account for mild to moderate spinal canal stenosis with mild spondylotic cord compression. There is borderline cord compression by posterior osteophytes at C3-C4 and C4-C5. There are interval increased calcifications at both carotid bifurcations. There is no laryngeal or thyroid mass. LUNG APICES: In the lung apices, there is centrilobular emphysema and asymmetric linear scarring again on the right. No acute upper thoracic findings. IMPRESSION: 1. No acute intracranial CT findings or depressed skull fractures. 2. Small vessel changes and atrophy , progressed from 2014. 3. Patchy fluid in the right mastoid tip. 4. Osteopenia and degenerative changes cervical spine, also progressive. 5. Emphysema. The patient probably qualifies for a low-dose lung cancer screening CT  program. Electronically signed by: Francis Quam MD 01/22/2024 06:52 AM EST RP Workstation: [id number]   DG Pelvis Portable Result Date: 01/22/2024 CLINICAL DATA:  Pain.  Skip down EXAM: PORTABLE PELVIS 1-2 VIEWS COMPARISON:  No comparison studies available. FINDINGS: Scratchbones are diffusely demineralized. No evidence for an acute fracture. No hip dislocation. SI joints and symphysis pubis are unremarkable. IMPRESSION: No acute bony findings. Electronically Signed   By: Camellia Candle M.D.   On: 01/22/2024 06:49   DG Shoulder Left Portable Result Date: 01/22/2024 CLINICAL DATA:  Fall.  Shoulder pain. EXAM: LEFT SHOULDER COMPARISON:  None Available. FINDINGS: Comminuted fracture of the humeral neck evident with mild angulation. Humeral head component of the fracture is located relative to the glenoid fossa. Acromioclavicular and coracoclavicular distances are preserved. IMPRESSION: Comminuted fracture of the humeral neck with mild angulation. Electronically Signed   By: Camellia Candle M.D.   On: 01/22/2024 06:48    Microbiology: Recent Results (from the past 240 hours)  Blood culture (routine x 2)     Status: None   Collection Time: 01/26/24  7:06 AM  Specimen: BLOOD  Result Value Ref Range Status   Specimen Description   Final    BLOOD SITE NOT SPECIFIED Performed at [facility], [address] [place], [place] [address]    Special Requests   Final    BOTTLES DRAWN AEROBIC AND ANAEROBIC Blood Culture results may not be optimal due to an inadequate volume of blood received in culture bottles Performed at [facility], [address], [place], [place] [address]    Culture   Final    NO GROWTH 5 DAYS Performed at [facility], [address] [place], [place] [address]    Report Status 01/31/2024 FINAL  Final  Urine Culture     Status: None   Collection Time: 01/26/24  7:55 AM   Specimen: Urine, Catheterized  Result Value Ref Range Status    Specimen Description   Final    URINE, CATHETERIZED Performed at [facility], [address] [place], [place] [address]    Special Requests   Final    NONE Performed at [facility], [address] [place], [place] [address]    Culture   Final    NO GROWTH Performed at [facility], [address], [place], [place] [address]    Report Status 01/27/2024 FINAL  Final  Blood culture (routine x 2)     Status: None   Collection Time: 01/26/24 10:48 AM   Specimen: BLOOD  Result Value Ref Range Status   Specimen Description   Final    BLOOD SITE NOT SPECIFIED Performed at [facility], [address] [place], [place] [address]    Special Requests   Final    BOTTLES DRAWN AEROBIC AND ANAEROBIC Blood Culture adequate volume Performed at [facility], [address], [place], [place] [address]    Culture   Final    NO GROWTH 5 DAYS Performed at [facility], [address], [place], [place] [address]    Report Status 01/31/2024 FINAL  Final     Labs: Basic Metabolic Panel: Recent Labs  Lab 01/27/24 0552 01/28/24 0557 01/29/24 0547 01/31/24 0527 02/01/24 0537 02/02/24 0446  NA 136 134* 135 136 136 137  K 2.8* 3.2* 4.0 3.4* 3.1* 3.8  CL 100 102 103 97* 100 102  CO2 19* 24 21* 28 25 24   GLUCOSE 92 99 91 92 86 87  BUN 9 <5* <5* 6* <5* <5*  CREATININE 0.51 0.31* 0.40* 0.41* 0.47 0.45  CALCIUM  8.6* 8.0* 8.7* 8.8* 8.7* 8.7*  MG 1.9 1.9 1.9  --   --   --    Liver Function Tests: Recent Labs  Lab 01/27/24 0552 01/28/24 0557 01/29/24 0547  AST 88* 45* 39  ALT 39 28 29  ALKPHOS 88 71 71  BILITOT 0.9 0.7 0.9  PROT 5.7* 5.2* 5.9*  ALBUMIN 3.1* 2.7* 3.2*   No results for input(s): LIPASE, AMYLASE in the last 168 hours. No results for input(s): AMMONIA in the last 168 hours. CBC: Recent Labs  Lab 01/27/24 0552 01/28/24 0557 01/29/24 0547 01/31/24 0527 02/01/24 0537  WBC  6.2 5.9 7.5 8.2 5.0  NEUTROABS 5.3 4.6 6.1  --   --   HGB 9.2* 8.0* 8.7* 9.8* 9.5*  HCT 27.7* 23.2* 27.0* 29.2* 28.6*  MCV 96.5 95.9 99.6 98.3 98.6  PLT 320 314 337 463* 492*   Cardiac Enzymes: No results for input(s): CKTOTAL, CKMB, CKMBINDEX, TROPONINI  in the last 168 hours. BNP: BNP (last 3 results) Recent Labs    11/16/23 0449 11/17/23 0516 12/04/23 1840  BNP 497.7* 366.5* 1,587.8*    ProBNP (last 3 results) No results for input(s): PROBNP in the last 8760 hours.  CBG: No results for input(s): GLUCAP in the last 168 hours.     Signed:  Toribio Hummer MD.  [facility] 02/02/2024, 3:06 PM       [1]  Allergies Allergen Reactions   Amoxicillin Rash   Vancomycin Itching    Erythema and mild itching -  Resolved with IV Benadryl .   Erythromycin Base Hives and Other (See Comments)    Pt states it makes her shaky.

## 2024-02-02 NOTE — Progress Notes (Addendum)
 Patient called nurse to room saying that she was short of breath. Head of bed elevated. Resp even and unlabored. 02 sat 97% on room air. Applied 02 at 1L via [place] for comfort. Albuterol  inhaler ordered from pharmacy. Was given 2 puffs along with her robitusson. Congested cough noted. Coughing and deep breathing encouraged, self suctioned small amount.   9381- Patient states she feels better. 02 sat 100% on 02 at 1L [place]. Explained to patient that due to her feeling better and 02 sats normalcy that 02 was not required at this time. Patient chooses to keep 02 nasal cannula in nose at this time. Will pass on to day shift nurse to wean off during the day and to continue to monitor.  02 weaned at 0.5L at this time.  9379- Patient allowed nurse to remove 02 nasal cannula at this time. Vitals taken and recorded.  02sat 97% on room air.

## 2024-02-02 NOTE — TOC Progression Note (Signed)
 Transition of Care [facility]) - Progression Note    Patient Details  Name: Wendy [name] MRN: [id number] Date of Birth: August 09, 1955  [facility] CM/SW Contact  Jon ONEIDA Anon, RN Phone Number: 02/02/2024, 3:25 PM  Clinical Narrative:    Per previous colleague, spoke with Admissions Coordinator at [facility] and she stated facility would have an open bed on Monday. MD placed DC order @ 1454. RNCM contacted [facility] and left a VM for Brittany, awaiting a return call. ICM following.    Expected Discharge Plan: Skilled Nursing Facility Barriers to Discharge: Continued Medical Work up               Expected Discharge Plan and Services   Discharge Planning Services: CM Consult Post Acute Care Choice: Skilled Nursing Facility Living arrangements for the past 2 months: Single Family Home Expected Discharge Date: 02/02/24                                     Social Drivers of Health (SDOH) Interventions SDOH Screenings   Food Insecurity: Food Insecurity Present (01/26/2024)  Housing: High Risk (01/26/2024)  Transportation Needs: No Transportation Needs (01/26/2024)  Utilities: At Risk (01/26/2024)  Social Connections: Socially Isolated (01/26/2024)  Tobacco Use: High Risk (01/26/2024)    Readmission Risk Interventions    01/27/2024   11:50 AM 11/17/2023    8:29 AM  Readmission Risk Prevention Plan  Post Dischage Appt  Complete  Medication Screening  Complete  Transportation Screening Complete Complete  Medication Review (RN Care Manager) Complete   PCP or Specialist appointment within 3-5 days of discharge Complete   SW Recovery Care/Counseling Consult Complete   Palliative Care Screening Not Applicable   Skilled Nursing Facility Complete

## 2024-02-03 ENCOUNTER — Encounter (HOSPITAL_COMMUNITY): Payer: Self-pay | Admitting: Internal Medicine

## 2024-02-03 MED ORDER — LOPERAMIDE HCL 2 MG PO CAPS
2.0000 mg | ORAL_CAPSULE | ORAL | Status: DC | PRN
  Administered 2024-02-03: 2 mg via ORAL
  Filled 2024-02-03: qty 1

## 2024-02-03 NOTE — Discharge Summary (Signed)
 Physician Discharge Summary  Wendy [name] FMW:[id number] DOB: 12-Jul-1955 DOA: 01/26/2024  PCP: Pcp, No  Admit date: 01/26/2024 Discharge date: 02/03/2024  Time spent: 60 minutes  Recommendations for Outpatient Follow-up:  Follow-up with MD at skilled nursing facility.  Patient will need a comprehensive metabolic profile done in 1 week to follow-up on electrolytes, renal function, LFTs. Follow-up with Dr. Teresa, orthopedics in 2 weeks. Follow-up with PCP in 2 to 3 weeks after discharge from skilled nursing facility.   Discharge Diagnoses:  Principal Problem:   Sepsis secondary to UTI [facility]) Active Problems:   Hypokalemia   Hyponatremia   High anion gap metabolic acidosis   Normocytic anemia   Closed left humeral fracture   Acidosis   Urinary tract infection with hematuria   Discharge Condition: Stable and improved.  Diet recommendation: Regular  Filed Weights   02/01/24 0636 02/02/24 0519 02/03/24 0500  Weight: 41.7 kg 40.2 kg 39.3 kg    History of present illness:  HPI per Dr. Celinda Apolinar Wendy [name] is a 69 y.o. female with medical history significant of rectal adenocarcinoma alcohol dependency, seasonal allergies, anxiety, chronic hep C, HSV-1 and HSV-2, history of DVT of the right tubular vein, hypertension, migraine headaches, history of TIA/CVA who was seen in the emergency department 4 days ago in the early morning due to having a mechanical fall landing on her left side, fracturing her proximal humerus who today was brought to the emergency department via EMS after being found on the floor by her husband.  She is able to answer simple questions, but is unable to elaborate on her HPI.  Denies headache, chest, back or abdominal pain at the time of my examination.   Lab work: Urinalysis showed moderate hemoglobin, small leukocyte esterase, ketones of 5 mg/dL, 88-79 WBC and rare bacteria.  UDS was positive for opiates and THC.  CBC showed white count of 16.3 with 94%  neutrophils, hemoglobin 9.5 g/dL and platelets 665.  Normal PT and INR.  APTT was 44 seconds.  CMP showed a sodium of 130, potassium 2.8, chloride 88 and CO2 17 mmol/L with an anion gap of 25.  AST 94 and ALT 51 units/L.  Total bilirubin 1.8 mg/dL.  Normal glucose, renal function, calcium , total protein, albumin and alkaline phosphatase.  Magnesium  was 1.7 and alcohol less than 15 mg/dL.  Lactic acid is 2.3 mmol/L.   Imaging: Left shoulder x-ray show comminuted fracture of the left humeral neck and greater tuberosity with apex lateral angulation of 60 degrees and increased proximal displacement of the distal fracture fragments relative to the humeral head compared to 01/22/2024.  Diffuse soft tissue swelling.  Left elbow x-ray was limited due to positioning.  There is possible anterior joint effusion with equivocal cortical defect along the radial head.  Second left shoulder x-ray showed acute displaced left proximal humerus fracture with left glenohumeral alignment maintained.  Pelvics x-ray no evidence of traumatic injury.  Portable 1 view chest radiograph with no acute cardiopulmonary findings.  CT head without contrast with no traumatic injury or intracranial abnormality.  There is moderate through the advanced cerebral white matter disease, which seems stable when compared to previous imaging.  CT cervical spine with no acute traumatic injury.  There is a very advanced cervical spine degeneration with evidence of severe multifactorial cervical spinal stenosis at C5-C6.  Emphysema.   ED course: Initial vital signs were temperature 98 F, pulse 75, respiration 19, BP 104/79 mmHg and O2 sat 94% on room air.  Patient received ceftriaxone  1 g IVPB, Benadryl  12.5 mg IVP, morphine  1 mg IVP, KCl 40 mEq p.o. x 1 KCl 10 mEq IVPB x 1 and 1500 mL of normal saline bolus.  Hospital Course:  #1 sepsis secondary to UTI -Patient on admission was noted to have met criteria for sepsis with leukocytosis, elevated lactic  acid level.  Urinalysis concerning for possible UTI. -Blood cultures with no growth to date. - Urine cultures negative. - Was on IV Rocephin  and transitioned to Duricef and completed 5-day course of antibiotic treatment during the hospitalization.  - No further antibiotics required on discharge.     2.  Acute metabolic encephalopathy -Likely secondary to probable UTI. - CT head negative for an acute abnormality. -Clinical improvement. - Likely close to baseline   3.  Closed left humeral fracture/mechanical fall -Secondary to mechanical fall. - Patient seen in consultation by orthopedics who recommended conservative management for now. - NWB to left upper extremity in sling with outpatient follow-up with orthopedics. - Patient placed on Tylenol  1000 mg 3 times daily.   - During hospitalization patient maintained oxycodone  10 mg every 6 hours as needed, Robaxin  500 mg every 8 hours as needed. -Patient also maintained on IV Toradol  as needed. -Patient was discharged to skilled nursing facility on oxycodone  5 to 10 mg every 6 hours as needed, Flexeril  as needed. - Outpatient follow-up with orthopedics.   4.  Dysphagia -Nursing staff noted to have reported issues with swallowing pills. - Patient seen by SLP who assessed patient and recommended continuation of regular diet with thin liquids and may need esophageal workup either in the inpatient versus outpatient. - Outpatient follow-up.   5.  Leukocytosis -Resolved.   6.  Anemia of chronic disease - Hemoglobin stabilized at 9.5 by day of discharge.   - Outpatient follow-up.   7.  Hyponatremia - Improved.    8.  Hypokalemia - Potassium at 3.1  - Likely secondary to resumption of home regimen Lasix .   - Patient will be placed on daily potassium supplementation of 10 mEq daily.   - Will need repeat labs in 1 week.    9.  Acute metabolic acidosis - Resolved.     10.  Elevated LFTs - Improved.   11.  Hypertension - Patient  maintained on lisinopril  10 mg daily, Lasix  20 mg daily with good blood pressure control.   - Outpatient follow-up with PCP.    12.  Hyperlipidemia - Patient statin held during the hospitalization due to elevated LFTs.   -It was noted per med rec that statin had been discontinued.  PCP.   - Statin will be held until follow-up with PCP for further recommendations.       Procedures: CT head CT C-spine 01/26/2024 Chest x-ray 01/26/2024 Plain films of the left elbow 01/26/2024 Plain films of the pelvis 01/26/2024 Plain films of the left shoulder 01/26/2024  Consultations: Orthopedics: Dr. Teresa 01/26/2024   Discharge Exam: Vitals:   02/02/24 2238 02/03/24 1010  BP: (!) 178/83 (!) 159/82  Pulse: 71 77  Resp: 18 16  Temp: 97.6 F (36.4 C) 98.1 F (36.7 C)  SpO2: 98% 98%    General: NAD.  Cardiovascular: RRR no murmurs rubs or gallops.  No JVD.  No lower extremity edema Respiratory: Clear to auscultation bilaterally.  No wheezes, no crackles, no rhonchi.  Fair air movement.  Speaking in full sentences.  Discharge Instructions   Discharge Instructions     Diet general   Complete by:  As directed    Increase activity slowly   Complete by: As directed    No wound care   Complete by: As directed       Allergies as of 02/03/2024       Reactions   Amoxicillin Rash   Vancomycin Itching   Erythema and mild itching -  Resolved with IV Benadryl .   Erythromycin Base Hives, Other (See Comments)   Pt states it makes her shaky.        Medication List     PAUSE taking these medications    Morphine  Sulfate (Concentrate) 5 MG/0.25ML Soln Wait to take this until your doctor or other care provider tells you to start again. Take 0.25 mLs by mouth every 6 (six) hours as needed (Pain).   rosuvastatin  20 MG tablet Wait to take this until your doctor or other care provider tells you to start again. Commonly known as: CRESTOR  Take 1 tablet (20 mg total) by mouth daily.        TAKE these medications    acetaminophen  500 MG tablet Commonly known as: TYLENOL  Take 2 tablets (1,000 mg total) by mouth 3 (three) times daily for 7 days, THEN 1 tablet (500 mg total) every 6 (six) hours as needed for mild pain (pain score 1-3) or headache. Start taking on: February 02, 2024 What changed:  medication strength See the new instructions.   albuterol  108 (90 Base) MCG/ACT inhaler Commonly known as: VENTOLIN  HFA Inhale 2 puffs into the lungs every 6 (six) hours as needed for wheezing or shortness of breath.   aspirin  EC 81 MG tablet Take 1 tablet (81 mg total) by mouth daily. Swallow whole.   furosemide  20 MG tablet Commonly known as: Lasix  Take 1 tablet (20 mg total) by mouth daily. What changed: Another medication with the same name was removed. Continue taking this medication, and follow the directions you see here.   lisinopril  10 MG tablet Commonly known as: ZESTRIL  Take 1 tablet (10 mg total) by mouth daily. What changed: Another medication with the same name was removed. Continue taking this medication, and follow the directions you see here.   oxyCODONE  5 MG immediate release tablet Commonly known as: Oxy IR/ROXICODONE  Take 1-2 tablets (5-10 mg total) by mouth every 6 (six) hours as needed for moderate pain (pain score 4-6). What changed:  how much to take reasons to take this   pantoprazole  40 MG tablet Commonly known as: PROTONIX  Take 1 tablet (40 mg total) by mouth daily.   potassium chloride  10 MEQ tablet Commonly known as: KLOR-CON  M Take 1 tablet (10 mEq total) by mouth daily.   senna-docusate 8.6-50 MG tablet Commonly known as: Senokot-S Take 1 tablet by mouth 2 (two) times daily.   tiZANidine  4 MG tablet Commonly known as: Zanaflex  Take 1 tablet (4 mg total) by mouth every 8 (eight) hours as needed for muscle spasms.       Allergies[1]  Contact information for follow-up providers     Teresa Rankin ORN, MD Follow up in 2 week(s).    Specialty: Orthopedic Surgery Contact information: [address] [place] [address] [phone]         MD AT SNF Follow up.          PCP. Schedule an appointment as soon as possible for a visit in 2 week(s).   Why: Follow-up in 2 to 3 weeks.             Contact information  for after-discharge care     Destination     [facility] .   Service: Skilled Nursing Contact information: [address] [place]  [address] [phone]                      The results of significant diagnostics from this hospitalization (including imaging, microbiology, ancillary and laboratory) are listed below for reference.    Significant Diagnostic Studies: DG ESOPHAGUS W SINGLE CM (SOL OR THIN BA) Result Date: 01/29/2024 CLINICAL DATA:  69 year old female admitted with AMS, urosepsis, left humeral fracture, with noted dysphagia to solids, liquids, and pills, while inpatient. EXAM: ESOPHAGUS/BARIUM SWALLOW/TABLET STUDY TECHNIQUE: Single contrast examination was performed using thin liquid barium. This exam was performed by Carlin Griffon, PA-C, and was supervised and interpreted by Dr. Ryan Salvage, MD. FLUOROSCOPY: Radiation Exposure Index (as provided by the fluoroscopic device): 8.50 mGy Kerma COMPARISON:  CT cervical spine without contrast on 01/26/2024. FINDINGS: Swallowing: Appears normal. No vestibular penetration or aspiration seen. Pharynx: Moderately dilated oropharynx (Series 2, image 32/52). Esophagus: Suboptimal characterization of the distal esophagus related to dysmotility. No definite stricture identified. Esophageal motility: Disruption of primary peristaltic waves in the upper to midthoracic esophagus with poor transit into the stomach, compatible with nonspecific esophageal dysmotility disorder. Hiatal Hernia: None. Gastroesophageal reflux: None visualized despite provocative maneuvers. Ingested 13mm barium tablet: Patient  unable to swallow the 13 mm barium tablet despite multiple attempts. Other: Study is severely limited by patient's inability to position appropriately for imaging, cause for limitations include the patient's displaced left humeral surgical neck fracture. Patient only able to position with table tilted past 45 degrees, and in AP position. IMPRESSION: 1. Nonspecific esophageal dysmotility disorder. 2. Patient unable to swallow barium pill despite attempts. 3. Reduced sensitivity of exam due to limitations with associated with patient positioning. Electronically Signed   By: Ryan Salvage M.D.   On: 01/29/2024 13:51   DG Shoulder 1V Left Result Date: 01/26/2024 EXAM: 1 VIEW XRAY OF THE LEFT SHOULDER 01/26/2024 10:38:00 AM COMPARISON: Left humerus series 08/03/2024 07:21:00 PM. CLINICAL HISTORY: 69 year old female. Proximal humerus fracture. Found down on floor. FINDINGS: BONES AND JOINTS: Acute displaced fracture of the proximal humerus with mild apex anterior angulation. The left humeral head fragment remains normally aligned with the left glenoid. SOFT TISSUES: Visualized lung is unremarkable. IMPRESSION: 1. Acute displaced left proximal humerus fracture with Left glenohumeral alignment maintained. Electronically signed by: Helayne Hurst MD MD 01/26/2024 11:08 AM EST RP Workstation: [id number]   CT CERVICAL SPINE WO CONTRAST Result Date: 01/26/2024 EXAM: CT CERVICAL SPINE WITHOUT CONTRAST 01/26/2024 10:45:29 AM TECHNIQUE: CT of the cervical spine was performed WITHOUT the administration of intravenous contrast. Multiplanar reformatted images are provided for review. Automated exposure control, iterative reconstruction, and/or weight based adjustment of the mA/kV was utilized to reduce the radiation dose to as low as reasonably achievable. COMPARISON: CT cervical spine 01/22/2024. Head CT reported separately today. CLINICAL HISTORY: 69 year old female. Fall. Found down on floor. FINDINGS: BONES AND ALIGNMENT:  Osteopenia appears age advanced with superimposed confluent and degenerative appearing endplate and vertebral body sclerosis in the mid and lower cervical spine. Stable alignment with degenerative anterolisthesis of C3 on C4 and degenerative left facet ankylosis at that level. No acute fracture or traumatic malalignment. DEGENERATIVE CHANGES: Chronic severe disc and endplate degeneration C3-C4 through C6-C7. Vacuum disc at C5-C6. Degenerative cervical spinal stenosis suspected and likely severe at the C5-C6 level (series 13 image 58). SOFT TISSUES: Bulky multifocal calcified cervical  carotid artery atherosclerosis. Otherwise negative visible non-contrast neck soft tissues. No prevertebral soft tissue swelling. LUNGS: Emphysema and mild scarring in the lung apices. IMPRESSION: 1. No acute traumatic injury identified in the cervical spine. 2. Very advanced cervical spine degeneration with evidence of SEVERE multifactorial cervical spinal stenosis at C5-C6. 3. Emphysema. Electronically signed by: Helayne Hurst MD 01/26/2024 11:06 AM EST RP Workstation: [id number]   CT HEAD WO CONTRAST ( ) Result Date: 01/26/2024 EXAM: CT HEAD WITHOUT CONTRAST 01/26/2024 10:45:29 AM TECHNIQUE: CT of the head was performed without the administration of intravenous contrast. Automated exposure control, iterative reconstruction, and/or weight based adjustment of the mA/kV was utilized to reduce the radiation dose to as low as reasonably achievable. COMPARISON: Head CT 01/22/2024. CLINICAL HISTORY: 69 year old female. Left-sided facial droop, possible stroke-like symptoms. Found down on floor. FINDINGS: BRAIN AND VENTRICLES: No acute hemorrhage. No evidence of acute infarct. No hydrocephalus. No extra-axial collection. No mass effect or midline shift. Brain volume stable and at the lower limits of normal for age. Moderate patchy bilateral cerebral white matter hypodensity is stable. Calcified atherosclerosis at the skull base. No  suspicious intracranial vascular hyperdensity. ORBITS: No acute abnormality. SINUSES: Paranasal sinuses remain well aerated. SOFT TISSUES AND SKULL: No acute soft tissue abnormality. No skull fracture. Tympanic cavities and mastoids remain well aerated. IMPRESSION: 1. No acute traumatic injury or intracranial abnormality identified. 2. Stable moderately advanced cerebral white matter disease. Electronically signed by: Helayne Hurst MD MD 01/26/2024 11:02 AM EST RP Workstation: [id number]   DG Elbow 2 Views Left Result Date: 01/26/2024 EXAM: 1 or 2 VIEW(S) XRAY OF THE LEFT ELBOW COMPARISON: 01/22/2024. CLINICAL HISTORY: fall, concern for recurrent injury FINDINGS: LIMITATIONS: Limited evaluation due to positioning. BONES AND JOINTS: Possible anterior joint effusion. Equivocal cortical defect along the radial head. Differential diagnosis for the cortical defect includes a non-displaced fracture, an old injury, or a normal variant. Follow-up imaging or clinical correlation is recommended. No malalignment. SOFT TISSUES: Unremarkable. IMPRESSION: 1. Limited evaluation due to positioning. 2. Possible anterior joint effusion with equivocal cortical defect along the radial head; recommend immobilization and follow-up radiographs in 7-10 days. Electronically signed by: Waddell Calk MD MD 01/26/2024 07:52 AM EST RP Workstation: [id number]   DG Shoulder Left Portable Result Date: 01/26/2024 EXAM: 1 VIEW(S) XRAY OF THE LEFT SHOULDER 01/26/2024 07:40:00 AM COMPARISON: 01/22/2024 CLINICAL HISTORY: known fracture, recurrent fall FINDINGS: BONES AND JOINTS: Comminuted fracture of the left humeral neck and greater tuberosity with apex lateral angulation of 60 degrees. Increased proximal displacement of the distal fracture fragments relative to the humeral head. The [facility] joint is unremarkable. SOFT TISSUES: Diffuse soft tissue swelling. No abnormal calcifications. Visualized lung is unremarkable. IMPRESSION: 1. Comminuted fracture  of the left humeral neck and greater tuberosity with apex lateral angulation of 60 degrees and increased proximal displacement of the distal fracture fragments relative to the humeral head, compared to 01/22/2024. 2. Diffuse soft tissue swelling. Electronically signed by: Waddell Calk MD MD 01/26/2024 07:48 AM EST RP Workstation: [id number]   DG Chest Portable 1 View Result Date: 01/26/2024 EXAM: 1 VIEW(S) XRAY OF THE CHEST 01/26/2024 07:40:00 AM COMPARISON: 12/06/2023 CLINICAL HISTORY: fall FINDINGS: LUNGS AND PLEURA: No focal pulmonary opacity. No pleural effusion. No pneumothorax. HEART AND MEDIASTINUM: Aortic calcification. No acute abnormality of the cardiac and mediastinal silhouettes. BONES AND SOFT TISSUES: Acute displaced fracture of surgical neck of left proximal humerus. IMPRESSION: 1. Acute displaced fracture of the surgical neck of the left proximal humerus. 2. No acute cardiopulmonary findings. Electronically  signed by: Waddell Calk MD MD 01/26/2024 07:45 AM EST RP Workstation: [id number]   DG Pelvis Portable Result Date: 01/26/2024 EXAM: 1 OR 2 VIEW(S) XRAY OF THE PELVIS 01/26/2024 07:40:00 AM COMPARISON: 01/22/2024 CLINICAL HISTORY: fall FINDINGS: BONES AND JOINTS: No acute fracture. No malalignment. SOFT TISSUES: Atherosclerotic plaque. IMPRESSION: 1. No evidence of acute traumatic injury. Electronically signed by: Waddell Calk MD MD 01/26/2024 07:44 AM EST RP Workstation: [id number]   DG Elbow Complete Left Result Date: 01/22/2024 CLINICAL DATA:  Fall.  Pain. EXAM: LEFT ELBOW - COMPLETE 3+ VIEW COMPARISON:  None Available. FINDINGS: Exam is markedly limited by positioning. Within this limitation, no gross fracture or dislocation is evident. Assessment for joint effusion is not possible due to the inability to obtain a true 90 degree lateral projection. IMPRESSION: Markedly limited study due to positioning. Within this limitation, no gross fracture or dislocation is evident.  Electronically Signed   By: Camellia Candle M.D.   On: 01/22/2024 06:55   CT Head Wo Contrast Result Date: 01/22/2024 EXAM: CT HEAD WITHOUT CONTRAST 01/22/2024 06:18:43 AM TECHNIQUE: CT of the head was performed without the administration of intravenous contrast. Automated exposure control, iterative reconstruction, and/or weight based adjustment of the mA/kV was utilized to reduce the radiation dose to as low as reasonably achievable. COMPARISON: Cervical spine CT and head CT, both 10/04/2012. CLINICAL HISTORY: Head trauma, moderate-severe. Fall injury with head and neck trauma. FINDINGS: BRAIN AND VENTRICLES: No acute hemorrhage. No evidence of acute infarct. No extra-axial collection. No mass effect or midline shift. There is mild to moderate cerebral atrophy with moderate small vessel disease of the cerebral white matter with atrophic ventriculomegaly and mild cerebellar volume loss. Findings are progressed from 2014. ORBITS: No acute abnormality. SINUSES: The sinuses are clear. The nasal septum is s-shaped. SOFT TISSUES AND SKULL: No acute soft tissue abnormality. No skull fracture. There is patchy fluid in the right mastoid tip. The other bilateral mastoid air cells are clear. CERVICAL SPINE: The cervical spine demonstrates mild osteopenia without evidence of fractures or focal pathologic bone lesions. Similar to 2014 there is a grade 1 degenerative anterolisthesis at C3-C4 with left facet joint ankylosis, and minimal discogenic retrolisthesis at C4-C5, C5-C6, and C6-C7. There is no traumatic listhesis or new abnormal alignment. Narrowing and osteophytes are again noted of the anterior atlantodental joint. There is progressive degenerative disc collapse from C3-C4 through C6-C7 with increasingly prominent bidirectional endplate osteophytes. Normal disc heights at C2-C3 and C7-T1. Multilevel moderate to advanced facet hypertrophy, left greater than right, with uncinate joint spurring. Acquired foraminal  stenosis is mild to moderate on the left at C2-C3, severe on the left at C3-C4, bilaterally severe at C4-C5, severe on the right greater than left at C5-C6, and severe on the right at C6-C7. At C2-C3 there is a small disc protrusion without mass effect. At C5-C6 posterior osteophytes account for mild to moderate spinal canal stenosis with mild spondylotic cord compression. There is borderline cord compression by posterior osteophytes at C3-C4 and C4-C5. There are interval increased calcifications at both carotid bifurcations. There is no laryngeal or thyroid mass. LUNG APICES: In the lung apices, there is centrilobular emphysema and asymmetric linear scarring again on the right. No acute upper thoracic findings. IMPRESSION: 1. No acute intracranial CT findings or depressed skull fractures. 2. Small vessel changes and atrophy , progressed from 2014. 3. Patchy fluid in the right mastoid tip. 4. Osteopenia and degenerative changes cervical spine, also progressive. 5. Emphysema. The patient probably qualifies for  a low-dose lung cancer screening CT program. Electronically signed by: Francis Quam MD 01/22/2024 06:52 AM EST RP Workstation: [id number]   CT Cervical Spine Wo Contrast Result Date: 01/22/2024 EXAM: CT HEAD WITHOUT CONTRAST 01/22/2024 06:18:43 AM TECHNIQUE: CT of the head was performed without the administration of intravenous contrast. Automated exposure control, iterative reconstruction, and/or weight based adjustment of the mA/kV was utilized to reduce the radiation dose to as low as reasonably achievable. COMPARISON: Cervical spine CT and head CT, both 10/04/2012. CLINICAL HISTORY: Head trauma, moderate-severe. Fall injury with head and neck trauma. FINDINGS: BRAIN AND VENTRICLES: No acute hemorrhage. No evidence of acute infarct. No extra-axial collection. No mass effect or midline shift. There is mild to moderate cerebral atrophy with moderate small vessel disease of the cerebral white matter with  atrophic ventriculomegaly and mild cerebellar volume loss. Findings are progressed from 2014. ORBITS: No acute abnormality. SINUSES: The sinuses are clear. The nasal septum is s-shaped. SOFT TISSUES AND SKULL: No acute soft tissue abnormality. No skull fracture. There is patchy fluid in the right mastoid tip. The other bilateral mastoid air cells are clear. CERVICAL SPINE: The cervical spine demonstrates mild osteopenia without evidence of fractures or focal pathologic bone lesions. Similar to 2014 there is a grade 1 degenerative anterolisthesis at C3-C4 with left facet joint ankylosis, and minimal discogenic retrolisthesis at C4-C5, C5-C6, and C6-C7. There is no traumatic listhesis or new abnormal alignment. Narrowing and osteophytes are again noted of the anterior atlantodental joint. There is progressive degenerative disc collapse from C3-C4 through C6-C7 with increasingly prominent bidirectional endplate osteophytes. Normal disc heights at C2-C3 and C7-T1. Multilevel moderate to advanced facet hypertrophy, left greater than right, with uncinate joint spurring. Acquired foraminal stenosis is mild to moderate on the left at C2-C3, severe on the left at C3-C4, bilaterally severe at C4-C5, severe on the right greater than left at C5-C6, and severe on the right at C6-C7. At C2-C3 there is a small disc protrusion without mass effect. At C5-C6 posterior osteophytes account for mild to moderate spinal canal stenosis with mild spondylotic cord compression. There is borderline cord compression by posterior osteophytes at C3-C4 and C4-C5. There are interval increased calcifications at both carotid bifurcations. There is no laryngeal or thyroid mass. LUNG APICES: In the lung apices, there is centrilobular emphysema and asymmetric linear scarring again on the right. No acute upper thoracic findings. IMPRESSION: 1. No acute intracranial CT findings or depressed skull fractures. 2. Small vessel changes and atrophy , progressed  from 2014. 3. Patchy fluid in the right mastoid tip. 4. Osteopenia and degenerative changes cervical spine, also progressive. 5. Emphysema. The patient probably qualifies for a low-dose lung cancer screening CT program. Electronically signed by: Francis Quam MD 01/22/2024 06:52 AM EST RP Workstation: [id number]   DG Pelvis Portable Result Date: 01/22/2024 CLINICAL DATA:  Pain.  Skip down EXAM: PORTABLE PELVIS 1-2 VIEWS COMPARISON:  No comparison studies available. FINDINGS: Scratchbones are diffusely demineralized. No evidence for an acute fracture. No hip dislocation. SI joints and symphysis pubis are unremarkable. IMPRESSION: No acute bony findings. Electronically Signed   By: Camellia Candle M.D.   On: 01/22/2024 06:49   DG Shoulder Left Portable Result Date: 01/22/2024 CLINICAL DATA:  Fall.  Shoulder pain. EXAM: LEFT SHOULDER COMPARISON:  None Available. FINDINGS: Comminuted fracture of the humeral neck evident with mild angulation. Humeral head component of the fracture is located relative to the glenoid fossa. Acromioclavicular and coracoclavicular distances are preserved. IMPRESSION: Comminuted fracture of the humeral  neck with mild angulation. Electronically Signed   By: Camellia Candle M.D.   On: 01/22/2024 06:48    Microbiology: Recent Results (from the past 240 hours)  Blood culture (routine x 2)     Status: None   Collection Time: 01/26/24  7:06 AM   Specimen: BLOOD  Result Value Ref Range Status   Specimen Description   Final    BLOOD SITE NOT SPECIFIED Performed at [facility], [address] [place], [place] [address]    Special Requests   Final    BOTTLES DRAWN AEROBIC AND ANAEROBIC Blood Culture results may not be optimal due to an inadequate volume of blood received in culture bottles Performed at [facility], [address] [place], [place] [address]    Culture   Final    NO GROWTH 5 DAYS Performed at [facility], [address] [place], [place] [address]    Report Status 01/31/2024 FINAL  Final  Urine Culture     Status: None   Collection Time: 01/26/24  7:55 AM   Specimen: Urine, Catheterized  Result Value Ref Range Status   Specimen Description   Final    URINE, CATHETERIZED Performed at [facility], [address] [place], [place] [address]    Special Requests   Final    NONE Performed at [facility], [address], [place], [place] [address]    Culture   Final    NO GROWTH Performed at [facility], [address] [place], [place] [address]    Report Status 01/27/2024 FINAL  Final  Blood culture (routine x 2)     Status: None   Collection Time: 01/26/24 10:48 AM   Specimen: BLOOD  Result Value Ref Range Status   Specimen Description   Final    BLOOD SITE NOT SPECIFIED Performed at [facility], [address] [place], [place] [address]    Special Requests   Final    BOTTLES DRAWN AEROBIC AND ANAEROBIC Blood Culture adequate volume Performed at [facility], [address], [place], [place] [address]    Culture   Final    NO GROWTH 5 DAYS Performed at [facility], [address] [place], [place] [address]    Report Status 01/31/2024 FINAL  Final     Labs: Basic Metabolic Panel: Recent Labs  Lab 01/28/24 0557 01/29/24 0547 01/31/24 0527 02/01/24 0537 02/02/24 0446  NA 134* 135 136 136 137  K 3.2* 4.0 3.4* 3.1* 3.8  CL 102 103 97* 100 102  CO2 24 21* 28 25 24   GLUCOSE 99 91 92 86 87  BUN <5* <5* 6* <5* <5*  CREATININE 0.31* 0.40* 0.41* 0.47 0.45  CALCIUM  8.0* 8.7* 8.8* 8.7* 8.7*  MG 1.9 1.9  --   --   --    Liver Function Tests: Recent Labs  Lab 01/28/24 0557 01/29/24 0547  AST 45* 39  ALT 28 29  ALKPHOS 71 71  BILITOT 0.7 0.9  PROT 5.2* 5.9*  ALBUMIN 2.7* 3.2*   No results for input(s): LIPASE, AMYLASE in the last 168 hours. No results for input(s): AMMONIA in the  last 168 hours. CBC: Recent Labs  Lab 01/28/24 0557 01/29/24 0547 01/31/24 0527 02/01/24 0537  WBC 5.9 7.5 8.2 5.0  NEUTROABS 4.6 6.1  --   --   HGB 8.0* 8.7* 9.8* 9.5*  HCT 23.2* 27.0* 29.2* 28.6*  MCV 95.9 99.6 98.3 98.6  PLT 314 337 463* 492*   Cardiac Enzymes: No results for input(s): CKTOTAL, CKMB, CKMBINDEX, TROPONINI in the last 168 hours. BNP: BNP (last 3 results) Recent Labs    11/16/23 0449 11/17/23 0516 12/04/23 1840  BNP 497.7* 366.5* 1,587.8*    ProBNP (last 3 results) No results for input(s): PROBNP in the last 8760 hours.  CBG: No results for input(s): GLUCAP in the last 168 hours.     Signed:  Toribio Hummer MD.  [facility] 02/03/2024, 12:44 PM        [1]  Allergies Allergen Reactions   Amoxicillin Rash   Vancomycin Itching    Erythema and mild itching -  Resolved with IV Benadryl .   Erythromycin Base Hives and Other (See Comments)    Pt states it makes her shaky.

## 2024-02-03 NOTE — Care Management Important Message (Signed)
 Important Message  Patient Details IM Letter given. Name: Wendy [name] MRN: [id number] Date of Birth: [birth year]   Important Message Given:  Yes - Medicare IM     Mariena Meares 02/03/2024, 2:18 PM

## 2024-02-03 NOTE — TOC Transition Note (Addendum)
 Transition of Care [facility]) - Discharge Note   Patient Details  Name: Wendy [name] MRN: [id number] Date of Birth: Jul 27, 1955  Transition of Care [facility]) CM/SW Contact:  Bascom Service, RN Phone Number: 02/03/2024, 12:01 PM   Clinical Narrative:No bed available @ [facility] stated. Patient/[name](Son) chose [facility]-rep Tammy accepted-d/c today. Await updated d/c summary w/todays date.MD aware. -2:30p-   PTAR called 1hr &1/2 p/u. going to [facility] rm[id number],report#[phone]. No further CM needs.    Final next level of care: Skilled Nursing Facility Barriers to Discharge: No Barriers Identified   Patient Goals and CMS Choice Patient states their goals for this hospitalization and ongoing recovery are:: [place] CMS Medicare.gov Compare Post Acute Care list provided to:: Patient Choice offered to / list presented to : Patient [place] ownership interest in [facility].provided to:: Adult Children    Discharge Placement              Patient chooses bed at: Other - please specify in the comment section below: [facility]) Patient to be transferred to facility by: PTAR Name of family member notified: [name](Son) Patient and family notified of of transfer: 02/03/24  Discharge Plan and Services Additional resources added to the After Visit Summary for     Discharge Planning Services: CM Consult Post Acute Care Choice: Skilled Nursing Facility                               Social Drivers of Health (SDOH) Interventions SDOH Screenings   Food Insecurity: No Food Insecurity (02/03/2024)  Recent Concern: Food Insecurity - Food Insecurity Present (01/26/2024)  Housing: Unknown (02/03/2024)  Recent Concern: Housing - High Risk (01/26/2024)  Transportation Needs: No Transportation Needs (01/26/2024)  Utilities: At Risk (01/26/2024)  Social Connections: Socially Isolated (01/26/2024)  Tobacco Use: High Risk (01/26/2024)     Readmission Risk Interventions     01/27/2024   11:50 AM 11/17/2023    8:29 AM  Readmission Risk Prevention Plan  Post Dischage Appt  Complete  Medication Screening  Complete  Transportation Screening Complete Complete  Medication Review (RN Care Manager) Complete   PCP or Specialist appointment within 3-5 days of discharge Complete   SW Recovery Care/Counseling Consult Complete   Palliative Care Screening Not Applicable   Skilled Nursing Facility Complete

## 2024-02-03 NOTE — Progress Notes (Signed)
 Patient was to be discharged yesterday however unable to leave. Patient currently stable.  Vital signs stable.  Physical exam unchanged. Patient medically stable for discharge. Awaiting SNF placement.  No charge.
